# Patient Record
Sex: Male | Born: 1935
Health system: Southern US, Community
[De-identification: ages and names within clinical notes are randomized; demographics above are authoritative.]

## PROBLEM LIST (undated history)

## (undated) DIAGNOSIS — N289 Disorder of kidney and ureter, unspecified: Secondary | ICD-10-CM

## (undated) DIAGNOSIS — I639 Cerebral infarction, unspecified: Secondary | ICD-10-CM

## (undated) DIAGNOSIS — R1310 Dysphagia, unspecified: Secondary | ICD-10-CM

## (undated) DIAGNOSIS — G8190 Hemiplegia, unspecified affecting unspecified side: Secondary | ICD-10-CM

## (undated) DIAGNOSIS — I10 Essential (primary) hypertension: Secondary | ICD-10-CM

## (undated) DIAGNOSIS — E785 Hyperlipidemia, unspecified: Secondary | ICD-10-CM

## (undated) DIAGNOSIS — M1990 Unspecified osteoarthritis, unspecified site: Secondary | ICD-10-CM

## (undated) DIAGNOSIS — R41841 Cognitive communication deficit: Secondary | ICD-10-CM

## (undated) DIAGNOSIS — D649 Anemia, unspecified: Secondary | ICD-10-CM

## (undated) DIAGNOSIS — R471 Dysarthria and anarthria: Secondary | ICD-10-CM

---

## 1985-04-06 HISTORY — PX: TIBIA FRACTURE SURGERY: SHX806

## 2000-07-20 ENCOUNTER — Encounter: Payer: Self-pay | Admitting: *Deleted

## 2000-07-20 ENCOUNTER — Inpatient Hospital Stay (HOSPITAL_COMMUNITY): Admission: EM | Admit: 2000-07-20 | Discharge: 2000-07-22 | Payer: Self-pay | Admitting: *Deleted

## 2000-07-30 ENCOUNTER — Inpatient Hospital Stay (HOSPITAL_COMMUNITY): Admission: AD | Admit: 2000-07-30 | Discharge: 2000-08-27 | Payer: Self-pay | Admitting: Family Medicine

## 2000-07-30 ENCOUNTER — Encounter: Payer: Self-pay | Admitting: Family Medicine

## 2000-08-27 ENCOUNTER — Ambulatory Visit (HOSPITAL_COMMUNITY): Admission: RE | Admit: 2000-08-27 | Discharge: 2000-08-27 | Payer: Self-pay | Admitting: *Deleted

## 2001-04-26 ENCOUNTER — Encounter: Payer: Self-pay | Admitting: Internal Medicine

## 2001-04-26 ENCOUNTER — Ambulatory Visit (HOSPITAL_COMMUNITY): Admission: RE | Admit: 2001-04-26 | Discharge: 2001-04-26 | Payer: Self-pay | Admitting: Internal Medicine

## 2002-01-12 ENCOUNTER — Encounter: Payer: Self-pay | Admitting: Internal Medicine

## 2002-01-12 ENCOUNTER — Ambulatory Visit (HOSPITAL_COMMUNITY): Admission: RE | Admit: 2002-01-12 | Discharge: 2002-01-12 | Payer: Self-pay | Admitting: Internal Medicine

## 2003-08-30 ENCOUNTER — Ambulatory Visit (HOSPITAL_COMMUNITY): Admission: RE | Admit: 2003-08-30 | Discharge: 2003-08-30 | Payer: Self-pay | Admitting: Family Medicine

## 2004-04-22 ENCOUNTER — Ambulatory Visit (HOSPITAL_COMMUNITY): Admission: RE | Admit: 2004-04-22 | Discharge: 2004-04-22 | Payer: Self-pay | Admitting: Family Medicine

## 2004-07-08 ENCOUNTER — Inpatient Hospital Stay (HOSPITAL_COMMUNITY): Admission: AD | Admit: 2004-07-08 | Discharge: 2004-07-11 | Payer: Self-pay | Admitting: Emergency Medicine

## 2004-07-09 ENCOUNTER — Encounter (INDEPENDENT_AMBULATORY_CARE_PROVIDER_SITE_OTHER): Payer: Self-pay | Admitting: *Deleted

## 2004-07-09 ENCOUNTER — Ambulatory Visit: Payer: Self-pay | Admitting: Critical Care Medicine

## 2005-01-26 ENCOUNTER — Ambulatory Visit (HOSPITAL_COMMUNITY): Admission: RE | Admit: 2005-01-26 | Discharge: 2005-01-26 | Payer: Self-pay | Admitting: Family Medicine

## 2006-10-11 ENCOUNTER — Emergency Department (HOSPITAL_COMMUNITY): Admission: EM | Admit: 2006-10-11 | Discharge: 2006-10-11 | Payer: Self-pay | Admitting: Emergency Medicine

## 2010-08-22 NOTE — H&P (Signed)
[name], Lane                 ACCOUNT NO.:  [id number]   MEDICAL RECORD NO.:  [id number]          PATIENT TYPE:  INP   LOCATION:  A206                          FACILITY:  [facility]   PHYSICIAN:  Jerry Lane, M.D.DATE OF BIRTH:  11/19/1927   DATE OF ADMISSION:  07/08/2004  DATE OF DISCHARGE:  LH                                HISTORY & PHYSICAL   PRIMARY CARE PHYSICIAN:  Jerry Mires, MD.   ADMISSION DIAGNOSES:  1.  Submassive hemoptysis, rule out pulmonary tuberculosis versus lung      cancer.  2.  Poorly controlled hypertension.  3.  Poorly controlled diabetes mellitus type 2.   CHIEF COMPLAINT:  Coughing up blood and spitting blood over the last one  week.   HISTORY OF PRESENT ILLNESS:  Jerry Lane is a 77 year old African-  American male who presented to the emergency room this evening complaining  of coughing up blood, spitting blood of several days' duration.  He says he  has had various amounts of blood; but at one point at home, he said he  filled up a bowl of at least 3 to 4 ounces.   He also says he is spitting blood.  The patient is a very poor historian,  and it was difficult to definitely obtain from him if he was actually having  hemoptysis or hematemesis.  However, in the emergency room, an NG tube was  placed; and he had about 200 mL of dark blood aspirated from his stomach.  When I saw the patient on the floor during my interview with him, the  patient was coughing, and he was coughing up fresh blood; and he filled up a  bowl with probably an ounce of fresh blood although he has remained  hemodynamically stable.  He denies any fever, chills, or rigors.  He does  report losing about 10 to 15 pounds over the last 2 months.  The patient did  seem to have a history of tuberculosis and has bilateral apical scarring on  his chest x-rays.  The patient lives alone and has no one else to give  additional information.  He was brought in by an ambulance.   Initial evaluation was felt to be upper GI bleed; but on further evaluation  now with the NG tube in place which is not actively draining and patient  actively coughing up fresh blood in my presence, I do suspect that this is  more hemoptysis; and I am concerned that the patient may actually open up  and bleed more tonight.   REVIEW OF SYSTEMS:  The patient denies any nausea.  He has no headaches,  dizziness, or lightheadedness.  He has felt generally weak and tired over  time.  He denies any hematuria.  No frequency or urgency.   PAST MEDICAL HISTORY:  1.  Hypertension.  2.  Non-insulin-dependent diabetes mellitus.  3.  History of previous abdominal surgery.  The reason for this was unclear.   MEDICATIONS:  1.  Metformin 1,000 mg b.i.d.  2.  He is also on Teveten/hydrochlorothiazide HCT 600/12.5 mg daily.  ALLERGIES:  He has no known drug allergies.   FAMILY HISTORY:  Not obtainable.   SOCIAL HISTORY:  The patient is single.  Never married.  He has a 100 pack  year history of smoking.  The patient said he used to drink alcohol a lot  but quit 10 years ago.  He denies any IV drug abuse or marijuana use.   PHYSICAL EXAMINATION:  GENERAL:  The patient was conscious, alert, having  multiple bouts of cough with hemoptysis.  VITAL SIGNS:  His blood pressure initially on arrival was 143/103 with a  pulse rate of 145, respiration was 48, temperature was 97.8.  His oxygen  sats were 99% on room air.  HEENT:  Normocephalic, atraumatic.  The patient has NG tube in his nostril.  The oral mucosa was stained with dried blood.  NECK:  Supple.  No JVD.  No lymphadenopathy.  LUNGS:  Markedly reduced air entry bilaterally with occasional crackles in  the apices.  HEART:  S1 and S2 tachycardic.  No S3, S4, gallops, or rubs.  ABDOMEN:  Soft.  Previous laparotomy scar was noted.  Bowel sounds are  positive.  No masses palpable.  No hepatosplenomegaly.  EXTREMITIES:  No pitting pedal edema.  No  calf induration or tenderness was  noted.  CNS:  Grossly intact with no focal deficits.   LABORATORY/DIAGNOSTIC DATA:  White blood cell count was 5.3, hemoglobin of  10.4, hematocrit of 30.9, platelet count was 479.  There was no left shift.  PT was 14.2, INR 1.2, PTT was 36.  Sodium was 131, potassium 3.7, chloride  of 100, CO2 was 22, glucose was 226, BUN of 14, creatinine is 1.5, calcium  is 8.5.   Chest x-ray shows bilateral apical scarring with some questionable left-  sided fullness.   ASSESSMENT/PLAN:  Jerry Lane is a 77 year old African-American male with past  medical history significant for hypertension and diabetes and a remote  history of pulmonary tuberculosis.  He presented to the emergency room today  with appears clearly to be hemoptysis.  I doubt that this is hematemesis.  I  think the patient is coughing up blood and probably swallowing it.  Based on  the amount of blood the patient said he has coughed up at home and the  amount of blood we have obtained here in the emergency room, I think the  patient may have submassive hemoptysis with potential for developing massive  hemoptysis tonight.  We currently do not have subspecialty coverage  including pulmonary at this time that may perform an emergency bronchoscopy  or intervention radiology for bronchial artery embolization.  I have  discussed this with the patient and advised that it is probably best to just  transfer him to [facility].  His possible differentials include  reactivation of pulmonary tuberculosis with a possible tuberculoma aneurysm  or lung cancer as the patient has a  history of smoking.  He will need  further workup including a CT scan.  But in terms of his hemodynamics and  continued hemoptysis, I think he will be best served in an institution with  specialized coverage available.  I have called Jerry Lane at [facility] who is the hospitalist on call to discuss the patient's transfer.   The  patient will be  on contact isolation throughout the course of his transfer  and monitored on telemetry, he is tachycardic and tachypneic.    AM/MEDQ  D:  07/08/2004  T:  07/09/2004  Job:  [id number]
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
## 2010-08-22 NOTE — Op Note (Signed)
Jerry, Lane                 ACCOUNT NO.:  [id number]   MEDICAL RECORD NO.:  [id number]          PATIENT TYPE:  INP   LOCATION:  2301                         FACILITY:  [facility]   PHYSICIAN:  Oley Balm. Sung Amabile, M.D. [facility] OF BIRTH:  1927/06/28   DATE OF PROCEDURE:  07/09/2004  DATE OF DISCHARGE:                                 OPERATIVE REPORT   PROCEDURE PERFORMED:  Bronchoscopy.   INDICATIONS FOR BRONCHOSCOPY:  Hemoptysis and left upper lobe cavitary  disease.   PREMEDICATION:  Demerol 50 milligrams IV, Versed 4 milligrams IV.   ANESTHESIA:  Topical anesthesia was applied to the nose and throat and 40 cc  of 1% lidocaine were used during the course of procedure.   PROCEDURE:  After adequate sedation and anesthesia, the bronchoscope was  introduced via the right naris and advanced in the posterior pharynx. This  demonstrated normal upper airway anatomy. Vocal cords moved symmetrically.  Further airway anesthesia was achieved with 1% lidocaine. The scope was  advanced to the trachea. Complete airway anesthesia was achieved with 1%  lidocaine and an airway examination was performed. This demonstrated  anatomic variants in both upper lobes. Otherwise, segmental airway anatomy  was normal. There were mild changes of chronic bronchitis. There were  moderate mucoid secretions throughout. From the left side, there were frothy  bloody secretions, especially emanating from the left upper lobe. After  completion of the airway examination, the bronchoscope was directed into the  left upper lobe for more thorough examination. There was no evidence of  endobronchial tumors, masses or foreign bodies. There appeared to be old  blood in the bronchus to the anterior segment of the left upper lobe.  However, due to the anatomic variant here, this segment also could have been  an apical bronchus. After completion of this airway examination, bronchial  washings and cytology brushings from the  left upper lobe were performed. The  patient tolerated the procedure well without complications. There was no  active bleeding by the time the bronchoscope was removed. The above  specimens were sent for AFB, fungal, bacterial studies and cytology.      DBS/MEDQ  D:  07/09/2004  T:  07/09/2004  Job:  [id number]   cc:   Isidor Holts, M.D.
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
## 2010-10-14 ENCOUNTER — Other Ambulatory Visit: Payer: Self-pay

## 2010-10-14 ENCOUNTER — Encounter (HOSPITAL_COMMUNITY): Payer: Self-pay

## 2010-10-14 ENCOUNTER — Encounter (HOSPITAL_COMMUNITY)
Admission: RE | Admit: 2010-10-14 | Discharge: 2010-10-14 | Disposition: A | Discharge status: Home / Self Care | Payer: PRIVATE HEALTH INSURANCE | Source: Ambulatory Visit | Attending: Ophthalmology | Admitting: Ophthalmology

## 2010-10-14 HISTORY — DX: Unspecified osteoarthritis, unspecified site: M19.90

## 2010-10-14 HISTORY — DX: Cerebral infarction, unspecified: I63.9

## 2010-10-14 HISTORY — DX: Essential (primary) hypertension: I10

## 2010-10-14 LAB — CBC
Hemoglobin: 10.2 g/dL — ABNORMAL LOW (ref 13.0–17.0)
MCH: 28.8 pg (ref 26.0–34.0)
MCHC: 32.1 g/dL (ref 30.0–36.0)
Platelets: 288 10*3/uL (ref 150–400)
RDW: 13.7 % (ref 11.5–15.5)
WBC: 3.6 10*3/uL — ABNORMAL LOW (ref 4.0–10.5)

## 2010-10-14 LAB — BASIC METABOLIC PANEL
Calcium: 9.7 mg/dL (ref 8.4–10.5)
Chloride: 110 mEq/L (ref 96–112)
Potassium: 5.5 mEq/L — ABNORMAL HIGH (ref 3.5–5.1)

## 2010-10-14 MED ORDER — CYCLOPENTOLATE-PHENYLEPHRINE 0.2-1 % OP SOLN
OPHTHALMIC | Status: AC
  Filled 2010-10-14: qty 2

## 2010-10-14 NOTE — Pre-Procedure Instructions (Signed)
Dr. Jayme Cloud and Dr. Lita Mains notified of abnormal lab values. Dr. Lita Mains to call pt and PCP. Labs faxed to Dr. Adaline Sill office.

## 2010-10-14 NOTE — Patient Instructions (Addendum)
Instructions Following General Anesthetic, Adult A nurse specialized in giving anesthesia (anesthetist) or a doctor specialized in giving anesthesia (anesthesiologist) gave you a medicine that made you sleep while a procedure was performed. For as long as 24 hours following this procedure, you may feel:  Dizzy.   Weak.   Drowsy.  AFTER YOUR SURGERY 1. After surgery, you will be taken to the recovery area where a nurse will monitor your progress. You will be allowed to go home when you are awake, stable, taking fluids well, and without complications.  2.  3. For the first 24 hours following an anesthetic:   Have a responsible person with you.   Do not drive a car. If you are alone, do not take public transportation.   Do not drink alcohol.   Do not take medicine that has not been prescribed by your caregiver.   Do not sign important papers or make important decisions.   You may resume normal diet and activities as directed.   Change bandages (dressings) as directed.   Only take over-the-counter or prescription medicines for pain, discomfort, or fever as directed by your caregiver.  If you have questions or problems that seem related to the anesthetic, call the hospital and ask for the anesthetist or anesthesiologist on call. SEEK IMMEDIATE MEDICAL CARE IF:  You develop a rash.   You have difficulty breathing.   You have chest pain.   You develop any allergic problems.  Document Released: 06/29/2000 Document Re-Released: 06/17/2009 [facility] Patient Information 2011 [place], [place].20 DEVONDRE GUZZETTA  10/14/2010   Your procedure is scheduled on:  Monday, 10/20/10  Report to [facility] at 08:00 AM.  Call this number if you have problems the morning of surgery: [phone]   Remember:   Do not eat food:After Midnight.  Do not drink clear liquids: After Midnight.  Take these medicines the morning of surgery with A SIP OF WATER: hydrochlorothiazide and amlodipine. NO DIABETIC MEDS  (SUGAR PILL).   Do not wear jewelry, make-up or nail polish.  Do not bring valuables to the hospital.  Contacts, dentures or bridgework may not be worn into surgery.    Patients discharged the day of surgery will not be allowed to drive home.  Name and phone number of your driver: driver  Special Instructions: N/A   Please read over the following fact sheets that you were given: Pain Booklet and Anesthesia Post-op Instructions

## 2010-10-20 ENCOUNTER — Encounter (HOSPITAL_COMMUNITY): Payer: Self-pay | Admitting: Anesthesiology

## 2010-10-20 ENCOUNTER — Other Ambulatory Visit: Payer: Self-pay

## 2010-10-20 ENCOUNTER — Encounter (HOSPITAL_COMMUNITY): Payer: Self-pay | Admitting: *Deleted

## 2010-10-20 ENCOUNTER — Inpatient Hospital Stay (HOSPITAL_COMMUNITY)
Admission: EM | Admit: 2010-10-20 | Discharge: 2010-10-21 | DRG: 684 | Disposition: A | Discharge status: Home / Self Care | Payer: PRIVATE HEALTH INSURANCE | Attending: Internal Medicine | Admitting: Internal Medicine

## 2010-10-20 ENCOUNTER — Encounter (HOSPITAL_COMMUNITY)
Admission: RE | Disposition: A | Discharge status: Hospice | Payer: Self-pay | Source: Ambulatory Visit | Attending: Ophthalmology

## 2010-10-20 ENCOUNTER — Inpatient Hospital Stay (HOSPITAL_COMMUNITY)
Admission: RE | Admit: 2010-10-20 | Discharge: 2010-10-20 | Disposition: A | Discharge status: Hospice | Payer: PRIVATE HEALTH INSURANCE | Source: Ambulatory Visit | Attending: Ophthalmology | Admitting: Ophthalmology

## 2010-10-20 DIAGNOSIS — I679 Cerebrovascular disease, unspecified: Secondary | ICD-10-CM | POA: Insufficient documentation

## 2010-10-20 DIAGNOSIS — Z5309 Procedure and treatment not carried out because of other contraindication: Secondary | ICD-10-CM | POA: Insufficient documentation

## 2010-10-20 DIAGNOSIS — E860 Dehydration: Secondary | ICD-10-CM | POA: Diagnosis present

## 2010-10-20 DIAGNOSIS — N189 Chronic kidney disease, unspecified: Secondary | ICD-10-CM | POA: Diagnosis present

## 2010-10-20 DIAGNOSIS — N19 Unspecified kidney failure: Secondary | ICD-10-CM | POA: Diagnosis present

## 2010-10-20 DIAGNOSIS — E1122 Type 2 diabetes mellitus with diabetic chronic kidney disease: Secondary | ICD-10-CM | POA: Diagnosis present

## 2010-10-20 DIAGNOSIS — Z01810 Encounter for preprocedural cardiovascular examination: Secondary | ICD-10-CM | POA: Insufficient documentation

## 2010-10-20 DIAGNOSIS — Z8673 Personal history of transient ischemic attack (TIA), and cerebral infarction without residual deficits: Secondary | ICD-10-CM

## 2010-10-20 DIAGNOSIS — E875 Hyperkalemia: Secondary | ICD-10-CM | POA: Diagnosis present

## 2010-10-20 DIAGNOSIS — N183 Chronic kidney disease, stage 3 unspecified: Secondary | ICD-10-CM | POA: Diagnosis present

## 2010-10-20 DIAGNOSIS — H2510 Age-related nuclear cataract, unspecified eye: Secondary | ICD-10-CM | POA: Insufficient documentation

## 2010-10-20 DIAGNOSIS — I10 Essential (primary) hypertension: Secondary | ICD-10-CM | POA: Diagnosis present

## 2010-10-20 DIAGNOSIS — E119 Type 2 diabetes mellitus without complications: Secondary | ICD-10-CM | POA: Diagnosis present

## 2010-10-20 DIAGNOSIS — Z01812 Encounter for preprocedural laboratory examination: Secondary | ICD-10-CM | POA: Insufficient documentation

## 2010-10-20 DIAGNOSIS — N179 Acute kidney failure, unspecified: Principal | ICD-10-CM | POA: Diagnosis present

## 2010-10-20 DIAGNOSIS — I129 Hypertensive chronic kidney disease with stage 1 through stage 4 chronic kidney disease, or unspecified chronic kidney disease: Secondary | ICD-10-CM | POA: Diagnosis present

## 2010-10-20 LAB — DIFFERENTIAL
Eosinophils Relative: 4 % (ref 0–5)
Lymphocytes Relative: 26 % (ref 12–46)
Lymphs Abs: 1 10*3/uL (ref 0.7–4.0)
Monocytes Relative: 7 % (ref 3–12)

## 2010-10-20 LAB — CBC
HCT: 29.9 % — ABNORMAL LOW (ref 39.0–52.0)
Hemoglobin: 9.7 g/dL — ABNORMAL LOW (ref 13.0–17.0)
MCV: 89 fL (ref 78.0–100.0)
Platelets: 271 10*3/uL (ref 150–400)
RBC: 3.36 MIL/uL — ABNORMAL LOW (ref 4.22–5.81)
WBC: 3.9 10*3/uL — ABNORMAL LOW (ref 4.0–10.5)

## 2010-10-20 LAB — BASIC METABOLIC PANEL
CO2: 17 mEq/L — ABNORMAL LOW (ref 19–32)
Calcium: 9.9 mg/dL (ref 8.4–10.5)
Glucose, Bld: 106 mg/dL — ABNORMAL HIGH (ref 70–99)
Sodium: 135 mEq/L (ref 135–145)

## 2010-10-20 LAB — GLUCOSE, CAPILLARY
Glucose-Capillary: 77 mg/dL (ref 70–99)
Glucose-Capillary: 134 mg/dL — ABNORMAL HIGH (ref 70–99)

## 2010-10-20 LAB — CARDIAC PANEL(CRET KIN+CKTOT+MB+TROPI)
CK, MB: 2.8 ng/mL (ref 0.3–4.0)
Troponin I: <0.3 ng/mL (ref <0.30)

## 2010-10-20 SURGERY — PHACOEMULSIFICATION, CATARACT, WITH IOL INSERTION
Anesthesia: Monitor Anesthesia Care | Laterality: Left

## 2010-10-20 MED ORDER — LIDOCAINE HCL 3.5 % OP GEL
OPHTHALMIC | Status: AC
  Filled 2010-10-20: qty 5

## 2010-10-20 MED ORDER — LACTATED RINGERS IV SOLN
INTRAVENOUS | Status: DC
  Administered 2010-10-20: 09:00:00 via INTRAVENOUS

## 2010-10-20 MED ORDER — TETRACAINE HCL 0.5 % OP SOLN
1.0000 [drp] | OPHTHALMIC | Status: AC
  Administered 2010-10-20: 1 [drp] via OPHTHALMIC

## 2010-10-20 MED ORDER — KETOROLAC TROMETHAMINE 0.5 % OP SOLN
1.0000 [drp] | OPHTHALMIC | Status: AC
  Administered 2010-10-20: 1 [drp] via OPHTHALMIC

## 2010-10-20 MED ORDER — CYCLOPENTOLATE-PHENYLEPHRINE 0.2-1 % OP SOLN
1.0000 [drp] | OPHTHALMIC | Status: AC
  Administered 2010-10-20: 1 [drp] via OPHTHALMIC

## 2010-10-20 MED ORDER — SODIUM POLYSTYRENE SULFONATE 15 GM/60ML PO SUSP: 45.0000 g | Freq: Once | ORAL | Status: DC

## 2010-10-20 MED ORDER — TETRACAINE HCL 0.5 % OP SOLN
OPHTHALMIC | Status: AC
  Administered 2010-10-20: 1 [drp] via OPHTHALMIC
  Filled 2010-10-20: qty 2

## 2010-10-20 MED ORDER — LIDOCAINE HCL 3.5 % OP GEL: 1.0000 "application " | Freq: Once | OPHTHALMIC | Status: DC

## 2010-10-20 MED ORDER — SODIUM POLYSTYRENE SULFONATE 15 GM/60ML PO SUSP
30.0000 g | Freq: Once | ORAL | Status: AC
  Administered 2010-10-20: 30 g via ORAL
  Filled 2010-10-20: qty 120

## 2010-10-20 MED ORDER — GATIFLOXACIN 0.5 % OP SOLN
1.0000 [drp] | OPHTHALMIC | Status: DC
  Administered 2010-10-20: 1 [drp] via OPHTHALMIC

## 2010-10-20 SURGICAL SUPPLY — 26 items
CAPSULAR TENSION RING-AMO (OPHTHALMIC RELATED) IMPLANT
CLOTH BEACON ORANGE TIMEOUT ST (SAFETY) IMPLANT
GLOVE BIO SURGEON STRL SZ7.5 (GLOVE) IMPLANT
GLOVE BIOGEL M 6.5 STRL (GLOVE) IMPLANT
GLOVE BIOGEL PI IND STRL 6.5 (GLOVE) IMPLANT
GLOVE BIOGEL PI IND STRL 7.0 (GLOVE) IMPLANT
GLOVE BIOGEL PI INDICATOR 6.5 (GLOVE)
GLOVE BIOGEL PI INDICATOR 7.0 (GLOVE)
GLOVE ECLIPSE 6.5 STRL STRAW (GLOVE) IMPLANT
GLOVE ECLIPSE 7.5 STRL STRAW (GLOVE) IMPLANT
GLOVE EXAM NITRILE LRG STRL (GLOVE) IMPLANT
GLOVE EXAM NITRILE MD LF STRL (GLOVE) IMPLANT
GLOVE SKINSENSE NS SZ6.5 (GLOVE)
GLOVE SKINSENSE NS SZ7.0 (GLOVE)
GLOVE SKINSENSE STRL SZ6.5 (GLOVE) IMPLANT
GLOVE SKINSENSE STRL SZ7.0 (GLOVE) IMPLANT
INST SET CATARACT ~~LOC~~ (KITS) IMPLANT
KIT VITRECTOMY (OPHTHALMIC RELATED) IMPLANT
PAD ARMBOARD 7.5X6 YLW CONV (MISCELLANEOUS) IMPLANT
PROC W NO LENS (INTRAOCULAR LENS)
PROC W SPEC LENS (INTRAOCULAR LENS)
PROCESS W NO LENS (INTRAOCULAR LENS) IMPLANT
PROCESS W SPEC LENS (INTRAOCULAR LENS) IMPLANT
RING MALYGIN (MISCELLANEOUS) IMPLANT
VISCOELASTIC ADDITIONAL (OPHTHALMIC RELATED) IMPLANT
WATER STERILE IRR 250ML POUR (IV SOLUTION) IMPLANT

## 2010-10-20 NOTE — ED Notes (Signed)
Report given to Morrie Sheldon, RN; pt placed on monitor for transport

## 2010-10-20 NOTE — ED Notes (Signed)
Called to give report and RN unavailable at this time, RN to call me back

## 2010-10-20 NOTE — ED Notes (Signed)
Pt denies having any pain at this time.  nad noted.

## 2010-10-20 NOTE — H&P (Signed)
ENDRE COUTTS MRN: [id number] DOB/AGE: 06/07/1927 77 y.o. Primary Care Physician:HILL,GERALD K, MD Admit date: 10/20/2010 Chief Complaint: Abnormal labs with hyperkalemia. HPI: This 77 year old man had preoperative lab work done and was due to have cataract surgery today. However his labs showed a potassium of 6.2 and he has been referred to the emergency room. The patient himself has no symptoms to complain all. In particular, he denies any palpitations chest pain or dyspnea. Interestingly approximately one week ago he also had labs drawn and on this occasion his potassium was 5.5 but his creatinine was higher at 3.8 compared to today which is 2.72.   Past medical history: 1. Hypertension. 2. Type 2 diabetes. 3. Cerebrovascular disease with a previous stroke any years ago with no residual weakness. 4. Recent finding of renal failure. 5. Osteoarthritis.     Family History  Problem Relation Age of Onset  . Anesthesia problems Neg Hx   . Hypotension Neg Hx   . Malignant hyperthermia Neg Hx   . Pseudochol deficiency Neg Hx    Family history: Noncontributory.  Social history: He is single and has never married. He lives alone. He manages to live independently. He quit smoking approximately 5 years ago but prior to that had been smoking for decades. He does not drink alcohol,, having quit many years ago.  Allergies: No Known Allergies  Medications Prior to Admission  Medication Dose Route Frequency Provider Last Rate Last Dose  . cyclopentolate-phenylephrine (CYCLOMYDRYL) 0.2-1 % ophthalmic solution 1 drop  1 drop Left Eye Q5 min Susa Simmonds   1 drop at 10/20/10 4098  . ketorolac (ACULAR) 0.5 % ophthalmic solution 1 drop  1 drop Left Eye Q5 min Susa Simmonds   1 drop at 10/20/10 0930  . sodium polystyrene (KAYEXALATE) 15 GM/60ML suspension 30 g  30 g Oral Once Tammy L. Triplett, PA   30 g at 10/20/10 1742  . tetracaine (PONTOCAINE) 0.5 % ophthalmic solution 1 drop  1 drop Left  Eye Q5 min Susa Simmonds   1 drop at 10/20/10 1191  . DISCONTD: gatifloxacin (ZYMAXID) 0.5 % ophthalmic drops 1 drop  1 drop Left Eye 1 day or 1 dose Susa Simmonds   1 drop at 10/20/10 0918  . DISCONTD: lactated ringers infusion   Intravenous Continuous Laurene Footman 20 mL/hr at 10/20/10 4782    . DISCONTD: lidocaine hcl (ophth) (AKTEN) 3.5 % ophthalmic gel 1 application  1 application Left Eye Once Susa Simmonds      . DISCONTD: lidocaine hcl (ophth) (AKTEN) 3.5 % ophthalmic gel           . DISCONTD: sodium polystyrene (KAYEXALATE) 15 GM/60ML suspension 45 g  45 g Oral Once Tammy L. Triplett, PA       Medications Prior to Admission  Medication Sig Dispense Refill  . amLODipine (NORVASC) 2.5 MG tablet Take 2.5 mg by mouth daily.        Marland Kitchen aspirin 81 MG tablet Take 81 mg by mouth daily. Aspirin 81 mg daily       . hydrochlorothiazide (,MICROZIDE/HYDRODIURIL,) 12.5 MG capsule Take 12.5 mg by mouth daily.        . metFORMIN (GLUCOPHAGE) 1000 MG tablet Take 1,000 mg by mouth 2 (two) times daily with a meal.             [id number] from the symptoms mentioned above,there are no other symptoms referable to all systems reviewed.  Physical Exam: Blood pressure 144/73, pulse 63,  temperature 97.4 F (36.3 C), temperature source Oral, height 5\' 5"  (1.651 m), weight 58.968 kg (130 lb), SpO2 100.00%. He looks systemically well. He looks slightly clinically dehydrated. There is no jaundice, clubbing, cyanosis or peripheral edema. He does not look clinically anemic. Cardiovascular: Heart sounds are present without murmurs. Jugular venous pressure not raised. He is not clinically in heart failure. Respiratory: Lung fields are clear. Abdomen: Soft, nontender without any masses. There is no hepatosplenomegaly. Neurological: He is alert and orientated without any focal neurological signs. Skin: No abnormalities. Results for orders placed during the hospital encounter of 10/20/10 (from the past 48 hour(s))   GLUCOSE, CAPILLARY     Status: Normal   Collection Time   10/20/10  2:29 PM      Component Value Range Comment   Glucose-Capillary 77  70 - 99 (mg/dL)   CBC     Status: Abnormal   Collection Time   10/20/10  2:36 PM      Component Value Range Comment   WBC 3.9 (*) 4.0 - 10.5 (K/uL)    RBC 3.36 (*) 4.22 - 5.81 (MIL/uL)    Hemoglobin 9.7 (*) 13.0 - 17.0 (g/dL)    HCT 16.1 (*) 09.6 - 52.0 (%)    MCV 89.0  78.0 - 100.0 (fL)    MCH 28.9  26.0 - 34.0 (pg)    MCHC 32.4  30.0 - 36.0 (g/dL)    RDW 04.5  40.9 - 81.1 (%)    Platelets 271  150 - 400 (K/uL)   DIFFERENTIAL     Status: Abnormal   Collection Time   10/20/10  2:36 PM      Component Value Range Comment   Neutrophils Relative 62  43 - 77 (%)    Neutro Abs 2.4  1.7 - 7.7 (K/uL)    Lymphocytes Relative 26  12 - 46 (%)    Lymphs Abs 1.0  0.7 - 4.0 (K/uL)    Monocytes Relative 7  3 - 12 (%)    Monocytes Absolute 0.3  0.1 - 1.0 (K/uL)    Eosinophils Relative 4  0 - 5 (%)    Eosinophils Absolute 0.2  0.0 - 0.7 (K/uL)    Basophils Relative 2 (*) 0 - 1 (%)    Basophils Absolute 0.1  0.0 - 0.1 (K/uL)   BASIC METABOLIC PANEL     Status: Abnormal   Collection Time   10/20/10  2:36 PM      Component Value Range Comment   Sodium 135  135 - 145 (mEq/L)    Potassium 6.2 (*) 3.5 - 5.1 (mEq/L)    Chloride 106  96 - 112 (mEq/L)    CO2 17 (*) 19 - 32 (mEq/L)    Glucose, Bld 106 (*) 70 - 99 (mg/dL)    BUN 36 (*) 6 - 23 (mg/dL)    Creatinine, Ser 9.14 (*) 0.50 - 1.35 (mg/dL)    Calcium 9.9  8.4 - 10.5 (mg/dL)    GFR calc non Af Amer 23 (*) >60 (mL/min)    GFR calc Af Amer 27 (*) >60 (mL/min)   CARDIAC PANEL(CRET KIN+CKTOT+MB+TROPI)     Status: Normal   Collection Time   10/20/10  2:36 PM      Component Value Range Comment   Total CK 115  7 - 232 (U/L)    CK, MB 2.8  0.3 - 4.0 (ng/mL)    Troponin I <0.30  <0.30 (ng/mL)    Relative Index 2.4  0.0 - 2.5     ECG: T waves are normal. There are no acute ST-T wave changes. In particular there is  no peaked T waves typical of hyperkalemia.   Impression: 1. Hyperkalemia without electrocardiographic changes. 2. Renal failure causing #1. 3. Diabetes mellitus. 4. Hypertension.     Plan: 1. Admit for observation. 2. Intravenous normal saline. 3. Monitor diabetes. 4. Consider ultrasound of the kidneys if renal function does not improve with rehydration. Further recommendations will depend on patient's hospital progress.      GOSRANI,NIMISH C 10/20/2010, 6:12 PM

## 2010-10-20 NOTE — OR Nursing (Signed)
Report given to judy young rn   Transported to er triage per wc        Pt denies any problems   Sister with pt

## 2010-10-20 NOTE — ED Notes (Signed)
Pt resting comfortably in bed, pt has been up to bathroom with assistance; was waiting on bed assignment and pt has now been to room 336

## 2010-10-20 NOTE — OR Nursing (Signed)
Case canceled per dr Lita Mains and dr Kirk Ruths es due to high potassium   Family and patient advised   Instructed pt to go to a kidney specialist for follow up with his abnormal labs   I STAT WAS DONE PRE-OP AND SHOWED ABNORMAL VALUESx

## 2010-10-20 NOTE — ED Notes (Signed)
Meal tray given.  nad noted.  Family at bedside.

## 2010-10-20 NOTE — ED Notes (Signed)
Pt physically placed to hallway 1. Registration to d/c pt in computer from day surgery to ED.

## 2010-10-20 NOTE — ED Notes (Signed)
Pt from day surgery. Was supposed to have cataract sx today but potassium was too high. NAD at this time.

## 2010-10-20 NOTE — ED Provider Notes (Signed)
History     Chief Complaint  Patient presents with  . Hyperkalemia    HPI Comments: PAtient was scheduled to have cataract surgery to left eye this morning and was found to have an elevated potassium level by the pre-op labs.  Patient was in day surgery and sent to ED for further evaluation.  Patient lives at home alone and has caregiver daily.  Patient denies any symptoms at this time.  Also denies recent illness, fever, chest pain or vomiting.    Patient is a 77 y.o. male presenting with general illness. The history is provided by the patient, a relative and a caregiver.  Illness  The current episode started today. The onset is undetermined. The problem occurs rarely. The problem has been unchanged. The problem is mild. The symptoms are relieved by nothing. The symptoms are aggravated by nothing. Associated symptoms include neck pain. Pertinent negatives include no orthopnea, no fever, no decreased vision, no eye itching, no abdominal pain, no constipation, no diarrhea, no vomiting, no headaches, no muscle aches, no neck stiffness, no cough, no wheezing, no eye discharge and no eye pain. He has been behaving normally. He has been eating and drinking normally. The last void occurred less than 6 hours ago. There were no sick contacts. Recently, medical care has been given by a specialist. Services received include medications given.    Past Medical History  Diagnosis Date  . Hypertension   . Diabetes mellitus     Type II  . Arthritis   . Stroke     no residual weakness    Past Surgical History  Procedure Date  . Tibia fracture surgery 1987    Right, [facility]?    Family History  Problem Relation Age of Onset  . Anesthesia problems Neg Hx   . Hypotension Neg Hx   . Malignant hyperthermia Neg Hx   . Pseudochol deficiency Neg Hx     History  Substance Use Topics  . Smoking status: Former Smoker -- 60 years    Types: Cigarettes    Quit date: 10/13/2005  . Smokeless tobacco: Not on  file  . Alcohol Use: No      Review of Systems  Constitutional: Negative for fever, chills, appetite change, fatigue and unexpected weight change.  HENT: Positive for neck pain. Negative for nosebleeds and neck stiffness.   Eyes: Negative for pain, discharge and itching.  Respiratory: Negative for cough and wheezing.   Cardiovascular: Negative.  Negative for orthopnea.  Gastrointestinal: Negative for vomiting, abdominal pain, diarrhea and constipation.  Musculoskeletal: Negative for myalgias, back pain, joint swelling and arthralgias.  Skin: Negative.   Neurological: Negative for dizziness, seizures, facial asymmetry, weakness, numbness and headaches.  Hematological: Does not bruise/bleed easily.  Psychiatric/Behavioral: Negative for behavioral problems.    Physical Exam  BP 149/58  Pulse 67  Temp(Src) 97.4 F (36.3 C) (Oral)  Ht 5\' 5"  (1.651 m)  Wt 130 lb (58.968 kg)  BMI 21.63 kg/m2  SpO2 99%  Physical Exam  Constitutional: He is oriented to person, place, and time. He appears well-developed and well-nourished. He is cooperative.  Non-toxic appearance.  HENT:  Head: Normocephalic and atraumatic.  Eyes: EOM are normal. Pupils are equal, round, and reactive to light.  Neck: Normal range of motion. Neck supple. No JVD present.  Cardiovascular: Normal rate, regular rhythm and normal heart sounds.   Pulmonary/Chest: Effort normal and breath sounds normal.  Abdominal: Soft. Bowel sounds are normal.  Musculoskeletal: He exhibits no edema and  no tenderness.  Lymphadenopathy:    He has no cervical adenopathy.  Neurological: He is alert and oriented to person, place, and time. Coordination normal.  Skin: Skin is warm and dry.  Psychiatric: He has a normal mood and affect.    ED Course  Procedures  MDM    Date: 10/20/2010  Rate:57  Rhythm: sinus bradycardia and sinus arrhythmia  QRS Axis: normal  Intervals: normal  ST/T Wave abnormalities: nonspecific T wave changes   Conduction Disutrbances:nonspecific intraventricular block  Narrative Interpretation:   Old EKG Reviewed: no significant changes   1730  Patient remains stable, NAD.  Vitals stable.  He has ate a meal tray, I have discussed care plan with EDP and pt also seen by EDP.   Lives at home alone, K+ is 6.2 will consult for admission  Keyonta Madrid L. [place], [place] 10/20/10 1737

## 2010-10-21 ENCOUNTER — Encounter (HOSPITAL_COMMUNITY): Payer: Self-pay

## 2010-10-21 LAB — COMPREHENSIVE METABOLIC PANEL
AST: 12 U/L (ref 0–37)
BUN: 33 mg/dL — ABNORMAL HIGH (ref 6–23)
CO2: 14 mEq/L — ABNORMAL LOW (ref 19–32)
Calcium: 9.3 mg/dL (ref 8.4–10.5)
Chloride: 112 mEq/L (ref 96–112)
Creatinine, Ser: 2.49 mg/dL — ABNORMAL HIGH (ref 0.50–1.35)
GFR calc Af Amer: 30 mL/min — ABNORMAL LOW (ref >60)
GFR calc non Af Amer: 25 mL/min — ABNORMAL LOW (ref >60)
Glucose, Bld: 62 mg/dL — ABNORMAL LOW (ref 70–99)
Total Bilirubin: 0.2 mg/dL — ABNORMAL LOW (ref 0.3–1.2)

## 2010-10-21 LAB — CBC
Hemoglobin: 9.7 g/dL — ABNORMAL LOW (ref 13.0–17.0)
MCH: 29.2 pg (ref 26.0–34.0)
Platelets: 234 10*3/uL (ref 150–400)
RBC: 3.32 MIL/uL — ABNORMAL LOW (ref 4.22–5.81)
WBC: 3.5 10*3/uL — ABNORMAL LOW (ref 4.0–10.5)

## 2010-10-21 LAB — GLUCOSE, CAPILLARY
Glucose-Capillary: 66 mg/dL — ABNORMAL LOW (ref 70–99)
Glucose-Capillary: 81 mg/dL (ref 70–99)

## 2010-10-21 LAB — HEMOGLOBIN A1C: Mean Plasma Glucose: 143 mg/dL — ABNORMAL HIGH (ref <117)

## 2010-10-21 MED ORDER — ASPIRIN EC 81 MG PO TBEC
81.0000 mg | DELAYED_RELEASE_TABLET | Freq: Every day | ORAL | Status: DC
  Administered 2010-10-21: 81 mg via ORAL
  Filled 2010-10-21: qty 1

## 2010-10-21 MED ORDER — ACETAMINOPHEN 650 MG RE SUPP: 650.0000 mg | Freq: Four times a day (QID) | RECTAL | Status: DC | PRN

## 2010-10-21 MED ORDER — ONDANSETRON HCL 4 MG PO TABS: 4.0000 mg | ORAL_TABLET | Freq: Four times a day (QID) | ORAL | Status: DC | PRN

## 2010-10-21 MED ORDER — INSULIN ASPART 100 UNIT/ML ~~LOC~~ SOLN: 0.0000 [IU] | Freq: Three times a day (TID) | SUBCUTANEOUS | Status: DC

## 2010-10-21 MED ORDER — SENNA 8.6 MG PO TABS: 2.0000 | ORAL_TABLET | Freq: Every day | ORAL | Status: DC | PRN

## 2010-10-21 MED ORDER — PNEUMOCOCCAL VAC POLYVALENT 25 MCG/0.5ML IJ INJ
0.5000 mL | INJECTION | Freq: Once | INTRAMUSCULAR | Status: DC
  Filled 2010-10-21: qty 0.5

## 2010-10-21 MED ORDER — SODIUM CHLORIDE 0.9 % IV SOLN
INTRAVENOUS | Status: DC
  Administered 2010-10-21 (×2): via INTRAVENOUS

## 2010-10-21 MED ORDER — ZOLPIDEM TARTRATE 5 MG PO TABS: 5.0000 mg | ORAL_TABLET | Freq: Every evening | ORAL | Status: DC | PRN

## 2010-10-21 MED ORDER — OXYCODONE HCL 5 MG PO TABS: 5.0000 mg | ORAL_TABLET | ORAL | Status: DC | PRN

## 2010-10-21 MED ORDER — ONDANSETRON HCL 4 MG/2ML IJ SOLN: 4.0000 mg | Freq: Four times a day (QID) | INTRAMUSCULAR | Status: DC | PRN

## 2010-10-21 MED ORDER — METFORMIN HCL 500 MG PO TABS
1000.0000 mg | ORAL_TABLET | Freq: Two times a day (BID) | ORAL | Status: DC
  Administered 2010-10-21: 1000 mg via ORAL
  Filled 2010-10-21: qty 2

## 2010-10-21 MED ORDER — SODIUM POLYSTYRENE SULFONATE PO POWD: Freq: Once | ORAL | Status: AC

## 2010-10-21 MED ORDER — AMLODIPINE BESYLATE 5 MG PO TABS
2.5000 mg | ORAL_TABLET | Freq: Every day | ORAL | Status: DC
  Administered 2010-10-21: 2.5 mg via ORAL
  Filled 2010-10-21: qty 1

## 2010-10-21 MED ORDER — ASPIRIN 81 MG PO TABS: 81.0000 mg | ORAL_TABLET | Freq: Every day | ORAL | Status: DC

## 2010-10-21 MED ORDER — ACETAMINOPHEN 325 MG PO TABS: 650.0000 mg | ORAL_TABLET | Freq: Four times a day (QID) | ORAL | Status: DC | PRN

## 2010-10-21 MED ORDER — ENOXAPARIN SODIUM 40 MG/0.4ML ~~LOC~~ SOLN
40.0000 mg | SUBCUTANEOUS | Status: DC
  Administered 2010-10-21: 40 mg via SUBCUTANEOUS
  Filled 2010-10-21: qty 0.4

## 2010-10-21 NOTE — Progress Notes (Signed)
CBG:66  Treatment: 15 GM Carbohydrate snack  Symptoms: None  Follow-up CBG: Time:0800 CBG Result:81  Possible Reasons for Event: Unknown  Comments/MD notified:dr. Karilyn Cota notified    Schonewitz, Candelaria Stagers

## 2010-10-21 NOTE — Discharge Summary (Signed)
Physician Discharge Summary  Patient ID: Jerry Lane MRN: [id number] DOB/AGE: December 27, 1927 78 y.o. Primary Care Physician:HILL,GERALD K, MD Admit date: 10/20/2010 Discharge date: 10/21/2010    Discharge Diagnoses:  1. Hyperkalemia. 2. Acute on probable chronic renal failure. 3. Diabetes mellitus. 4. Hypertension.    Current Discharge Medication List    START taking these medications   Details  sodium polystyrene (KAYEXALATE) powder Take by mouth once. Take 15g once daily Qty: 454 g, Refills: 0      CONTINUE these medications which have NOT CHANGED   Details  amLODipine (NORVASC) 2.5 MG tablet Take 2.5 mg by mouth daily.      aspirin 81 MG tablet Take 81 mg by mouth daily. Aspirin 81 mg daily     metFORMIN (GLUCOPHAGE) 1000 MG tablet Take 1,000 mg by mouth 2 (two) times daily with a meal.        STOP taking these medications     hydrochlorothiazide (,MICROZIDE/HYDRODIURIL,) 12.5 MG capsule         Discharged Condition: Stable.    Consults: None.  Significant Diagnostic Studies: No results found.  Lab Results: Results for orders placed during the hospital encounter of 10/20/10 (from the past 24 hour(s))  GLUCOSE, CAPILLARY     Status: Normal   Collection Time   10/20/10  2:29 PM      Component Value Range   Glucose-Capillary 77  70 - 99 (mg/dL)  CBC     Status: Abnormal   Collection Time   10/20/10  2:36 PM      Component Value Range   WBC 3.9 (*) 4.0 - 10.5 (K/uL)   RBC 3.36 (*) 4.22 - 5.81 (MIL/uL)   Hemoglobin 9.7 (*) 13.0 - 17.0 (g/dL)   HCT 08.6 (*) 57.8 - 52.0 (%)   MCV 89.0  78.0 - 100.0 (fL)   MCH 28.9  26.0 - 34.0 (pg)   MCHC 32.4  30.0 - 36.0 (g/dL)   RDW 46.9  62.9 - 52.8 (%)   Platelets 271  150 - 400 (K/uL)  DIFFERENTIAL     Status: Abnormal   Collection Time   10/20/10  2:36 PM      Component Value Range   Neutrophils Relative 62  43 - 77 (%)   Neutro Abs 2.4  1.7 - 7.7 (K/uL)   Lymphocytes Relative 26  12 - 46 (%)   Lymphs Abs 1.0   0.7 - 4.0 (K/uL)   Monocytes Relative 7  3 - 12 (%)   Monocytes Absolute 0.3  0.1 - 1.0 (K/uL)   Eosinophils Relative 4  0 - 5 (%)   Eosinophils Absolute 0.2  0.0 - 0.7 (K/uL)   Basophils Relative 2 (*) 0 - 1 (%)   Basophils Absolute 0.1  0.0 - 0.1 (K/uL)  BASIC METABOLIC PANEL     Status: Abnormal   Collection Time   10/20/10  2:36 PM      Component Value Range   Sodium 135  135 - 145 (mEq/L)   Potassium 6.2 (*) 3.5 - 5.1 (mEq/L)   Chloride 106  96 - 112 (mEq/L)   CO2 17 (*) 19 - 32 (mEq/L)   Glucose, Bld 106 (*) 70 - 99 (mg/dL)   BUN 36 (*) 6 - 23 (mg/dL)   Creatinine, Ser 4.13 (*) 0.50 - 1.35 (mg/dL)   Calcium 9.9  8.4 - 24.4 (mg/dL)   GFR calc non Af Amer 23 (*) >60 (mL/min)   GFR calc Af Amer 27 (*) >  60 (mL/min)  CARDIAC PANEL(CRET KIN+CKTOT+MB+TROPI)     Status: Normal   Collection Time   10/20/10  2:36 PM      Component Value Range   Total CK 115  7 - 232 (U/L)   CK, MB 2.8  0.3 - 4.0 (ng/mL)   Troponin I <0.30  <0.30 (ng/mL)   Relative Index 2.4  0.0 - 2.5   GLUCOSE, CAPILLARY     Status: Abnormal   Collection Time   10/20/10  7:04 PM      Component Value Range   Glucose-Capillary 134 (*) 70 - 99 (mg/dL)   Comment 1 Documented in Chart     Comment 2 Notify RN    COMPREHENSIVE METABOLIC PANEL     Status: Abnormal   Collection Time   10/21/10  5:28 AM      Component Value Range   Sodium 140  135 - 145 (mEq/L)   Potassium 5.5 (*) 3.5 - 5.1 (mEq/L)   Chloride 112  96 - 112 (mEq/L)   CO2 14 (*) 19 - 32 (mEq/L)   Glucose, Bld 62 (*) 70 - 99 (mg/dL)   BUN 33 (*) 6 - 23 (mg/dL)   Creatinine, Ser 4.09 (*) 0.50 - 1.35 (mg/dL)   Calcium 9.3  8.4 - 81.1 (mg/dL)   Total Protein 8.2  6.0 - 8.3 (g/dL)   Albumin 3.9  3.5 - 5.2 (g/dL)   AST 12  0 - 37 (U/L)   ALT <5  0 - 53 (U/L)   Alkaline Phosphatase 58  39 - 117 (U/L)   Total Bilirubin 0.2 (*) 0.3 - 1.2 (mg/dL)   GFR calc non Af Amer 25 (*) >60 (mL/min)   GFR calc Af Amer 30 (*) >60 (mL/min)  CBC     Status: Abnormal    Collection Time   10/21/10  5:28 AM      Component Value Range   WBC 3.5 (*) 4.0 - 10.5 (K/uL)   RBC 3.32 (*) 4.22 - 5.81 (MIL/uL)   Hemoglobin 9.7 (*) 13.0 - 17.0 (g/dL)   HCT 91.4 (*) 78.2 - 52.0 (%)   MCV 89.8  78.0 - 100.0 (fL)   MCH 29.2  26.0 - 34.0 (pg)   MCHC 32.6  30.0 - 36.0 (g/dL)   RDW 95.6  21.3 - 08.6 (%)   Platelets 234  150 - 400 (K/uL)  GLUCOSE, CAPILLARY     Status: Abnormal   Collection Time   10/21/10  7:45 AM      Component Value Range   Glucose-Capillary 66 (*) 70 - 99 (mg/dL)  GLUCOSE, CAPILLARY     Status: Normal   Collection Time   10/21/10  8:12 AM      Component Value Range   Glucose-Capillary 81  70 - 99 (mg/dL)     Hospital Course: This  pleasant 77 year old man was admitted because of abnormal lab work. Please see initial history and physical examination. He was found to have potassium of 6.2 in the face of renal failure. He had no cardiac symptoms nor physical signs. His electrocardiogram did not show any evidence of peaked T waves associated with hyperkalemia. He did not have any cardiac arrhythmias. Looking back at his previous lab work, as recently as approximately one week ago his creatinine was 3.8. On this admission it was lower and today after rehydration it has improved further. His potassium is now 5.5. He says he saw a nephrologist approximately 3-4 years ago but not recently. I  suspect he has chronic kidney disease and has not followed up. Since admission he has really not had any symptoms and hasn't achieved a fairly safe potassium level with simple rehydration. He will be discharged home on Kayexalate to keep his potassium lower. Also his thiazide diuretic has been discontinued as this may have affected renal function. His blood pressure will need to be monitored in view of this.  Discharge Exam: Blood pressure 139/81, pulse 72, temperature 97.8 F (36.6 C), temperature source Oral, resp. rate 18, height 5\' 4"  (1.626 m), weight 59.33 kg (130 lb  12.8 oz), SpO2 99.00%. He looks systemically well. Heart sounds are present and normal without murmurs. Lung fields are clear. He is alert and oriented without any focal neurological signs. Abdomen is soft nontender without masses.  Disposition: Home. It is important that he follows up with a nephrologist so that his blood pressure and renal function can be monitored.  Discharge Orders    Future Orders Please Complete By Expires   Diet - low sodium heart healthy      Activity as tolerated - No restrictions         Follow-up Information    Follow up with [facility]. Make an appointment in 1 week.   Contact information:   [address] [place] [address] [phone]          Signed: Wilson Singer 10/21/2010, 10:47 AM

## 2010-10-21 NOTE — Progress Notes (Signed)
D/c instructions reviewed with patient. Verbalized understanding. Pt dc'd to home with caregiver. Schonewitz, Candelaria Stagers 10/21/2010

## 2010-10-21 NOTE — Progress Notes (Signed)
CBG 66 this am, metformin scheduled to be given.  MD notified.  Orders given to give Metformin as scheduled.  Orders received and carried out.  Schonewitz, Candelaria Stagers 10/21/2010 0800

## 2010-10-22 LAB — POCT I-STAT, CHEM 8
Chloride: 122 mEq/L — ABNORMAL HIGH (ref 96–112)
Glucose, Bld: 108 mg/dL — ABNORMAL HIGH (ref 70–99)
HCT: 34 % — ABNORMAL LOW (ref 39.0–52.0)
Hemoglobin: 11.6 g/dL — ABNORMAL LOW (ref 13.0–17.0)
Potassium: 6.6 mEq/L (ref 3.5–5.1)

## 2010-10-29 ENCOUNTER — Other Ambulatory Visit (HOSPITAL_COMMUNITY): Payer: Self-pay | Admitting: Nephrology

## 2010-10-29 DIAGNOSIS — N289 Disorder of kidney and ureter, unspecified: Secondary | ICD-10-CM

## 2010-11-03 ENCOUNTER — Ambulatory Visit (HOSPITAL_COMMUNITY)
Admission: RE | Admit: 2010-11-03 | Discharge: 2010-11-03 | Disposition: A | Discharge status: Home / Self Care | Payer: PRIVATE HEALTH INSURANCE | Source: Ambulatory Visit | Attending: Nephrology | Admitting: Nephrology

## 2010-11-03 DIAGNOSIS — N189 Chronic kidney disease, unspecified: Secondary | ICD-10-CM | POA: Insufficient documentation

## 2010-11-03 DIAGNOSIS — I10 Essential (primary) hypertension: Secondary | ICD-10-CM | POA: Insufficient documentation

## 2010-11-03 DIAGNOSIS — N400 Enlarged prostate without lower urinary tract symptoms: Secondary | ICD-10-CM | POA: Insufficient documentation

## 2010-11-03 DIAGNOSIS — E119 Type 2 diabetes mellitus without complications: Secondary | ICD-10-CM | POA: Insufficient documentation

## 2010-11-03 DIAGNOSIS — N289 Disorder of kidney and ureter, unspecified: Secondary | ICD-10-CM

## 2010-11-10 NOTE — H&P (Signed)
See scanned op note 

## 2010-12-18 NOTE — ED Provider Notes (Signed)
Medical screening examination/treatment/procedure(s) were performed by non-physician practitioner and as supervising physician I was immediately available for consultation/collaboration.  Nicholes Stairs, MD 12/18/10 [phone]

## 2011-01-20 LAB — URINALYSIS, ROUTINE W REFLEX MICROSCOPIC
Bilirubin Urine: NEGATIVE
Hgb urine dipstick: NEGATIVE
Nitrite: NEGATIVE
Protein, ur: NEGATIVE
Urobilinogen, UA: 0.2

## 2011-11-11 ENCOUNTER — Other Ambulatory Visit (HOSPITAL_COMMUNITY): Payer: Self-pay | Admitting: Urology

## 2011-11-11 DIAGNOSIS — R109 Unspecified abdominal pain: Secondary | ICD-10-CM

## 2011-11-11 DIAGNOSIS — N400 Enlarged prostate without lower urinary tract symptoms: Secondary | ICD-10-CM

## 2011-11-18 ENCOUNTER — Ambulatory Visit (HOSPITAL_COMMUNITY)
Admission: RE | Admit: 2011-11-18 | Discharge: 2011-11-18 | Disposition: A | Discharge status: Home / Self Care | Payer: PRIVATE HEALTH INSURANCE | Source: Ambulatory Visit | Attending: Urology | Admitting: Urology

## 2011-11-18 DIAGNOSIS — R1032 Left lower quadrant pain: Secondary | ICD-10-CM | POA: Insufficient documentation

## 2011-11-18 DIAGNOSIS — N400 Enlarged prostate without lower urinary tract symptoms: Secondary | ICD-10-CM

## 2011-11-18 DIAGNOSIS — R109 Unspecified abdominal pain: Secondary | ICD-10-CM

## 2013-09-04 DEATH — deceased

## 2014-04-06 DIAGNOSIS — I10 Essential (primary) hypertension: Secondary | ICD-10-CM | POA: Diagnosis not present

## 2014-04-09 DIAGNOSIS — I10 Essential (primary) hypertension: Secondary | ICD-10-CM | POA: Diagnosis not present

## 2014-04-10 DIAGNOSIS — I10 Essential (primary) hypertension: Secondary | ICD-10-CM | POA: Diagnosis not present

## 2014-04-11 DIAGNOSIS — I10 Essential (primary) hypertension: Secondary | ICD-10-CM | POA: Diagnosis not present

## 2014-04-12 DIAGNOSIS — I10 Essential (primary) hypertension: Secondary | ICD-10-CM | POA: Diagnosis not present

## 2014-04-13 DIAGNOSIS — I10 Essential (primary) hypertension: Secondary | ICD-10-CM | POA: Diagnosis not present

## 2014-04-16 DIAGNOSIS — I10 Essential (primary) hypertension: Secondary | ICD-10-CM | POA: Diagnosis not present

## 2014-04-17 DIAGNOSIS — I10 Essential (primary) hypertension: Secondary | ICD-10-CM | POA: Diagnosis not present

## 2014-04-18 DIAGNOSIS — I10 Essential (primary) hypertension: Secondary | ICD-10-CM | POA: Diagnosis not present

## 2014-04-19 ENCOUNTER — Encounter (HOSPITAL_COMMUNITY): Payer: Self-pay | Admitting: Ophthalmology

## 2014-04-19 DIAGNOSIS — I10 Essential (primary) hypertension: Secondary | ICD-10-CM | POA: Diagnosis not present

## 2014-04-20 DIAGNOSIS — I10 Essential (primary) hypertension: Secondary | ICD-10-CM | POA: Diagnosis not present

## 2014-04-23 DIAGNOSIS — E1122 Type 2 diabetes mellitus with diabetic chronic kidney disease: Secondary | ICD-10-CM | POA: Diagnosis not present

## 2014-04-23 DIAGNOSIS — N184 Chronic kidney disease, stage 4 (severe): Secondary | ICD-10-CM | POA: Diagnosis not present

## 2014-04-23 DIAGNOSIS — I499 Cardiac arrhythmia, unspecified: Secondary | ICD-10-CM | POA: Diagnosis not present

## 2014-04-23 DIAGNOSIS — I10 Essential (primary) hypertension: Secondary | ICD-10-CM | POA: Diagnosis not present

## 2014-04-24 DIAGNOSIS — I10 Essential (primary) hypertension: Secondary | ICD-10-CM | POA: Diagnosis not present

## 2014-04-25 DIAGNOSIS — I10 Essential (primary) hypertension: Secondary | ICD-10-CM | POA: Diagnosis not present

## 2014-04-26 DIAGNOSIS — I10 Essential (primary) hypertension: Secondary | ICD-10-CM | POA: Diagnosis not present

## 2014-04-27 DIAGNOSIS — I10 Essential (primary) hypertension: Secondary | ICD-10-CM | POA: Diagnosis not present

## 2014-04-30 DIAGNOSIS — I10 Essential (primary) hypertension: Secondary | ICD-10-CM | POA: Diagnosis not present

## 2014-05-01 DIAGNOSIS — I10 Essential (primary) hypertension: Secondary | ICD-10-CM | POA: Diagnosis not present

## 2014-05-02 DIAGNOSIS — I10 Essential (primary) hypertension: Secondary | ICD-10-CM | POA: Diagnosis not present

## 2014-05-03 DIAGNOSIS — I10 Essential (primary) hypertension: Secondary | ICD-10-CM | POA: Diagnosis not present

## 2014-05-04 DIAGNOSIS — I10 Essential (primary) hypertension: Secondary | ICD-10-CM | POA: Diagnosis not present

## 2014-05-07 DIAGNOSIS — I10 Essential (primary) hypertension: Secondary | ICD-10-CM | POA: Diagnosis not present

## 2014-05-08 DIAGNOSIS — I10 Essential (primary) hypertension: Secondary | ICD-10-CM | POA: Diagnosis not present

## 2014-05-09 DIAGNOSIS — I10 Essential (primary) hypertension: Secondary | ICD-10-CM | POA: Diagnosis not present

## 2014-05-10 DIAGNOSIS — I10 Essential (primary) hypertension: Secondary | ICD-10-CM | POA: Diagnosis not present

## 2014-05-11 DIAGNOSIS — I10 Essential (primary) hypertension: Secondary | ICD-10-CM | POA: Diagnosis not present

## 2014-05-14 DIAGNOSIS — I10 Essential (primary) hypertension: Secondary | ICD-10-CM | POA: Diagnosis not present

## 2014-05-15 DIAGNOSIS — I10 Essential (primary) hypertension: Secondary | ICD-10-CM | POA: Diagnosis not present

## 2014-05-16 DIAGNOSIS — I10 Essential (primary) hypertension: Secondary | ICD-10-CM | POA: Diagnosis not present

## 2014-05-17 DIAGNOSIS — I10 Essential (primary) hypertension: Secondary | ICD-10-CM | POA: Diagnosis not present

## 2014-05-18 DIAGNOSIS — I10 Essential (primary) hypertension: Secondary | ICD-10-CM | POA: Diagnosis not present

## 2014-05-21 DIAGNOSIS — I10 Essential (primary) hypertension: Secondary | ICD-10-CM | POA: Diagnosis not present

## 2014-05-22 DIAGNOSIS — I10 Essential (primary) hypertension: Secondary | ICD-10-CM | POA: Diagnosis not present

## 2014-05-23 DIAGNOSIS — I10 Essential (primary) hypertension: Secondary | ICD-10-CM | POA: Diagnosis not present

## 2014-05-24 DIAGNOSIS — I10 Essential (primary) hypertension: Secondary | ICD-10-CM | POA: Diagnosis not present

## 2014-05-25 DIAGNOSIS — I10 Essential (primary) hypertension: Secondary | ICD-10-CM | POA: Diagnosis not present

## 2014-05-28 DIAGNOSIS — I10 Essential (primary) hypertension: Secondary | ICD-10-CM | POA: Diagnosis not present

## 2014-05-29 DIAGNOSIS — I10 Essential (primary) hypertension: Secondary | ICD-10-CM | POA: Diagnosis not present

## 2014-05-30 DIAGNOSIS — I10 Essential (primary) hypertension: Secondary | ICD-10-CM | POA: Diagnosis not present

## 2014-05-31 DIAGNOSIS — I10 Essential (primary) hypertension: Secondary | ICD-10-CM | POA: Diagnosis not present

## 2014-06-01 DIAGNOSIS — I10 Essential (primary) hypertension: Secondary | ICD-10-CM | POA: Diagnosis not present

## 2014-06-04 DIAGNOSIS — I10 Essential (primary) hypertension: Secondary | ICD-10-CM | POA: Diagnosis not present

## 2014-06-05 DIAGNOSIS — I10 Essential (primary) hypertension: Secondary | ICD-10-CM | POA: Diagnosis not present

## 2014-06-06 DIAGNOSIS — I10 Essential (primary) hypertension: Secondary | ICD-10-CM | POA: Diagnosis not present

## 2014-06-07 DIAGNOSIS — I10 Essential (primary) hypertension: Secondary | ICD-10-CM | POA: Diagnosis not present

## 2014-06-08 DIAGNOSIS — I10 Essential (primary) hypertension: Secondary | ICD-10-CM | POA: Diagnosis not present

## 2014-06-11 DIAGNOSIS — I10 Essential (primary) hypertension: Secondary | ICD-10-CM | POA: Diagnosis not present

## 2014-06-12 DIAGNOSIS — I10 Essential (primary) hypertension: Secondary | ICD-10-CM | POA: Diagnosis not present

## 2014-06-13 DIAGNOSIS — I10 Essential (primary) hypertension: Secondary | ICD-10-CM | POA: Diagnosis not present

## 2014-06-14 DIAGNOSIS — I10 Essential (primary) hypertension: Secondary | ICD-10-CM | POA: Diagnosis not present

## 2014-06-15 DIAGNOSIS — I10 Essential (primary) hypertension: Secondary | ICD-10-CM | POA: Diagnosis not present

## 2014-06-18 DIAGNOSIS — I10 Essential (primary) hypertension: Secondary | ICD-10-CM | POA: Diagnosis not present

## 2014-06-19 DIAGNOSIS — I10 Essential (primary) hypertension: Secondary | ICD-10-CM | POA: Diagnosis not present

## 2014-06-20 DIAGNOSIS — I10 Essential (primary) hypertension: Secondary | ICD-10-CM | POA: Diagnosis not present

## 2014-06-21 DIAGNOSIS — I10 Essential (primary) hypertension: Secondary | ICD-10-CM | POA: Diagnosis not present

## 2014-06-22 DIAGNOSIS — I10 Essential (primary) hypertension: Secondary | ICD-10-CM | POA: Diagnosis not present

## 2014-06-25 DIAGNOSIS — E1122 Type 2 diabetes mellitus with diabetic chronic kidney disease: Secondary | ICD-10-CM | POA: Diagnosis not present

## 2014-06-25 DIAGNOSIS — I10 Essential (primary) hypertension: Secondary | ICD-10-CM | POA: Diagnosis not present

## 2014-06-25 DIAGNOSIS — N184 Chronic kidney disease, stage 4 (severe): Secondary | ICD-10-CM | POA: Diagnosis not present

## 2014-06-25 DIAGNOSIS — I129 Hypertensive chronic kidney disease with stage 1 through stage 4 chronic kidney disease, or unspecified chronic kidney disease: Secondary | ICD-10-CM | POA: Diagnosis not present

## 2014-06-26 DIAGNOSIS — I10 Essential (primary) hypertension: Secondary | ICD-10-CM | POA: Diagnosis not present

## 2014-06-27 DIAGNOSIS — I10 Essential (primary) hypertension: Secondary | ICD-10-CM | POA: Diagnosis not present

## 2014-06-28 DIAGNOSIS — I10 Essential (primary) hypertension: Secondary | ICD-10-CM | POA: Diagnosis not present

## 2014-06-29 DIAGNOSIS — I10 Essential (primary) hypertension: Secondary | ICD-10-CM | POA: Diagnosis not present

## 2014-07-02 DIAGNOSIS — I10 Essential (primary) hypertension: Secondary | ICD-10-CM | POA: Diagnosis not present

## 2014-07-03 DIAGNOSIS — I10 Essential (primary) hypertension: Secondary | ICD-10-CM | POA: Diagnosis not present

## 2014-07-04 DIAGNOSIS — I10 Essential (primary) hypertension: Secondary | ICD-10-CM | POA: Diagnosis not present

## 2014-07-05 DIAGNOSIS — I10 Essential (primary) hypertension: Secondary | ICD-10-CM | POA: Diagnosis not present

## 2014-07-06 DIAGNOSIS — I10 Essential (primary) hypertension: Secondary | ICD-10-CM | POA: Diagnosis not present

## 2014-07-07 DIAGNOSIS — I10 Essential (primary) hypertension: Secondary | ICD-10-CM | POA: Diagnosis not present

## 2014-07-08 DIAGNOSIS — I10 Essential (primary) hypertension: Secondary | ICD-10-CM | POA: Diagnosis not present

## 2014-07-09 DIAGNOSIS — I10 Essential (primary) hypertension: Secondary | ICD-10-CM | POA: Diagnosis not present

## 2014-07-10 DIAGNOSIS — I10 Essential (primary) hypertension: Secondary | ICD-10-CM | POA: Diagnosis not present

## 2014-07-11 DIAGNOSIS — I10 Essential (primary) hypertension: Secondary | ICD-10-CM | POA: Diagnosis not present

## 2014-07-12 DIAGNOSIS — I10 Essential (primary) hypertension: Secondary | ICD-10-CM | POA: Diagnosis not present

## 2014-07-13 DIAGNOSIS — I10 Essential (primary) hypertension: Secondary | ICD-10-CM | POA: Diagnosis not present

## 2014-07-14 DIAGNOSIS — I10 Essential (primary) hypertension: Secondary | ICD-10-CM | POA: Diagnosis not present

## 2014-07-15 DIAGNOSIS — I10 Essential (primary) hypertension: Secondary | ICD-10-CM | POA: Diagnosis not present

## 2014-07-16 DIAGNOSIS — I10 Essential (primary) hypertension: Secondary | ICD-10-CM | POA: Diagnosis not present

## 2014-07-17 DIAGNOSIS — I10 Essential (primary) hypertension: Secondary | ICD-10-CM | POA: Diagnosis not present

## 2014-07-18 DIAGNOSIS — I10 Essential (primary) hypertension: Secondary | ICD-10-CM | POA: Diagnosis not present

## 2014-07-19 DIAGNOSIS — I10 Essential (primary) hypertension: Secondary | ICD-10-CM | POA: Diagnosis not present

## 2014-07-20 DIAGNOSIS — I10 Essential (primary) hypertension: Secondary | ICD-10-CM | POA: Diagnosis not present

## 2014-07-21 DIAGNOSIS — I10 Essential (primary) hypertension: Secondary | ICD-10-CM | POA: Diagnosis not present

## 2014-07-22 DIAGNOSIS — I10 Essential (primary) hypertension: Secondary | ICD-10-CM | POA: Diagnosis not present

## 2014-07-23 DIAGNOSIS — I10 Essential (primary) hypertension: Secondary | ICD-10-CM | POA: Diagnosis not present

## 2014-07-24 DIAGNOSIS — I10 Essential (primary) hypertension: Secondary | ICD-10-CM | POA: Diagnosis not present

## 2014-07-25 DIAGNOSIS — I10 Essential (primary) hypertension: Secondary | ICD-10-CM | POA: Diagnosis not present

## 2014-07-26 DIAGNOSIS — I10 Essential (primary) hypertension: Secondary | ICD-10-CM | POA: Diagnosis not present

## 2014-07-27 DIAGNOSIS — I10 Essential (primary) hypertension: Secondary | ICD-10-CM | POA: Diagnosis not present

## 2014-07-28 DIAGNOSIS — I10 Essential (primary) hypertension: Secondary | ICD-10-CM | POA: Diagnosis not present

## 2014-07-29 DIAGNOSIS — I10 Essential (primary) hypertension: Secondary | ICD-10-CM | POA: Diagnosis not present

## 2014-07-30 DIAGNOSIS — I10 Essential (primary) hypertension: Secondary | ICD-10-CM | POA: Diagnosis not present

## 2014-07-31 DIAGNOSIS — I10 Essential (primary) hypertension: Secondary | ICD-10-CM | POA: Diagnosis not present

## 2014-08-01 DIAGNOSIS — I10 Essential (primary) hypertension: Secondary | ICD-10-CM | POA: Diagnosis not present

## 2014-08-02 DIAGNOSIS — I10 Essential (primary) hypertension: Secondary | ICD-10-CM | POA: Diagnosis not present

## 2014-08-03 DIAGNOSIS — I10 Essential (primary) hypertension: Secondary | ICD-10-CM | POA: Diagnosis not present

## 2014-08-04 DIAGNOSIS — I10 Essential (primary) hypertension: Secondary | ICD-10-CM | POA: Diagnosis not present

## 2014-08-05 DIAGNOSIS — I10 Essential (primary) hypertension: Secondary | ICD-10-CM | POA: Diagnosis not present

## 2014-08-06 DIAGNOSIS — I10 Essential (primary) hypertension: Secondary | ICD-10-CM | POA: Diagnosis not present

## 2014-08-07 DIAGNOSIS — I10 Essential (primary) hypertension: Secondary | ICD-10-CM | POA: Diagnosis not present

## 2014-08-08 DIAGNOSIS — I10 Essential (primary) hypertension: Secondary | ICD-10-CM | POA: Diagnosis not present

## 2014-08-09 DIAGNOSIS — I10 Essential (primary) hypertension: Secondary | ICD-10-CM | POA: Diagnosis not present

## 2014-08-10 DIAGNOSIS — I10 Essential (primary) hypertension: Secondary | ICD-10-CM | POA: Diagnosis not present

## 2014-08-11 DIAGNOSIS — I10 Essential (primary) hypertension: Secondary | ICD-10-CM | POA: Diagnosis not present

## 2014-08-12 DIAGNOSIS — I10 Essential (primary) hypertension: Secondary | ICD-10-CM | POA: Diagnosis not present

## 2014-08-13 DIAGNOSIS — I10 Essential (primary) hypertension: Secondary | ICD-10-CM | POA: Diagnosis not present

## 2014-08-14 DIAGNOSIS — I10 Essential (primary) hypertension: Secondary | ICD-10-CM | POA: Diagnosis not present

## 2014-08-15 DIAGNOSIS — I10 Essential (primary) hypertension: Secondary | ICD-10-CM | POA: Diagnosis not present

## 2014-08-16 DIAGNOSIS — I10 Essential (primary) hypertension: Secondary | ICD-10-CM | POA: Diagnosis not present

## 2014-08-17 DIAGNOSIS — I10 Essential (primary) hypertension: Secondary | ICD-10-CM | POA: Diagnosis not present

## 2014-08-18 DIAGNOSIS — I10 Essential (primary) hypertension: Secondary | ICD-10-CM | POA: Diagnosis not present

## 2014-08-19 DIAGNOSIS — I10 Essential (primary) hypertension: Secondary | ICD-10-CM | POA: Diagnosis not present

## 2014-08-20 DIAGNOSIS — I10 Essential (primary) hypertension: Secondary | ICD-10-CM | POA: Diagnosis not present

## 2014-08-21 DIAGNOSIS — I10 Essential (primary) hypertension: Secondary | ICD-10-CM | POA: Diagnosis not present

## 2014-08-22 DIAGNOSIS — I10 Essential (primary) hypertension: Secondary | ICD-10-CM | POA: Diagnosis not present

## 2014-08-23 DIAGNOSIS — I10 Essential (primary) hypertension: Secondary | ICD-10-CM | POA: Diagnosis not present

## 2014-08-24 DIAGNOSIS — I10 Essential (primary) hypertension: Secondary | ICD-10-CM | POA: Diagnosis not present

## 2014-08-25 DIAGNOSIS — I10 Essential (primary) hypertension: Secondary | ICD-10-CM | POA: Diagnosis not present

## 2014-08-26 DIAGNOSIS — I10 Essential (primary) hypertension: Secondary | ICD-10-CM | POA: Diagnosis not present

## 2014-08-27 DIAGNOSIS — I10 Essential (primary) hypertension: Secondary | ICD-10-CM | POA: Diagnosis not present

## 2014-08-28 DIAGNOSIS — I10 Essential (primary) hypertension: Secondary | ICD-10-CM | POA: Diagnosis not present

## 2014-08-29 DIAGNOSIS — I10 Essential (primary) hypertension: Secondary | ICD-10-CM | POA: Diagnosis not present

## 2014-08-30 DIAGNOSIS — I10 Essential (primary) hypertension: Secondary | ICD-10-CM | POA: Diagnosis not present

## 2014-08-31 DIAGNOSIS — I10 Essential (primary) hypertension: Secondary | ICD-10-CM | POA: Diagnosis not present

## 2014-09-01 DIAGNOSIS — I10 Essential (primary) hypertension: Secondary | ICD-10-CM | POA: Diagnosis not present

## 2014-09-02 DIAGNOSIS — I10 Essential (primary) hypertension: Secondary | ICD-10-CM | POA: Diagnosis not present

## 2014-09-03 DIAGNOSIS — I10 Essential (primary) hypertension: Secondary | ICD-10-CM | POA: Diagnosis not present

## 2014-09-04 DIAGNOSIS — I10 Essential (primary) hypertension: Secondary | ICD-10-CM | POA: Diagnosis not present

## 2014-09-05 DIAGNOSIS — I10 Essential (primary) hypertension: Secondary | ICD-10-CM | POA: Diagnosis not present

## 2014-09-06 DIAGNOSIS — I10 Essential (primary) hypertension: Secondary | ICD-10-CM | POA: Diagnosis not present

## 2014-09-07 DIAGNOSIS — I10 Essential (primary) hypertension: Secondary | ICD-10-CM | POA: Diagnosis not present

## 2014-09-08 DIAGNOSIS — I10 Essential (primary) hypertension: Secondary | ICD-10-CM | POA: Diagnosis not present

## 2014-09-09 DIAGNOSIS — I10 Essential (primary) hypertension: Secondary | ICD-10-CM | POA: Diagnosis not present

## 2014-09-10 DIAGNOSIS — I10 Essential (primary) hypertension: Secondary | ICD-10-CM | POA: Diagnosis not present

## 2014-09-11 DIAGNOSIS — I10 Essential (primary) hypertension: Secondary | ICD-10-CM | POA: Diagnosis not present

## 2014-09-12 DIAGNOSIS — I10 Essential (primary) hypertension: Secondary | ICD-10-CM | POA: Diagnosis not present

## 2014-09-13 DIAGNOSIS — I10 Essential (primary) hypertension: Secondary | ICD-10-CM | POA: Diagnosis not present

## 2014-09-14 DIAGNOSIS — I10 Essential (primary) hypertension: Secondary | ICD-10-CM | POA: Diagnosis not present

## 2014-09-15 DIAGNOSIS — I10 Essential (primary) hypertension: Secondary | ICD-10-CM | POA: Diagnosis not present

## 2014-09-16 DIAGNOSIS — I10 Essential (primary) hypertension: Secondary | ICD-10-CM | POA: Diagnosis not present

## 2014-09-17 DIAGNOSIS — I10 Essential (primary) hypertension: Secondary | ICD-10-CM | POA: Diagnosis not present

## 2014-09-18 DIAGNOSIS — I10 Essential (primary) hypertension: Secondary | ICD-10-CM | POA: Diagnosis not present

## 2014-09-19 DIAGNOSIS — I10 Essential (primary) hypertension: Secondary | ICD-10-CM | POA: Diagnosis not present

## 2014-09-20 DIAGNOSIS — I10 Essential (primary) hypertension: Secondary | ICD-10-CM | POA: Diagnosis not present

## 2014-09-21 DIAGNOSIS — I10 Essential (primary) hypertension: Secondary | ICD-10-CM | POA: Diagnosis not present

## 2014-09-22 DIAGNOSIS — I10 Essential (primary) hypertension: Secondary | ICD-10-CM | POA: Diagnosis not present

## 2014-09-23 DIAGNOSIS — I10 Essential (primary) hypertension: Secondary | ICD-10-CM | POA: Diagnosis not present

## 2014-09-24 DIAGNOSIS — I10 Essential (primary) hypertension: Secondary | ICD-10-CM | POA: Diagnosis not present

## 2014-09-24 DIAGNOSIS — E1122 Type 2 diabetes mellitus with diabetic chronic kidney disease: Secondary | ICD-10-CM | POA: Diagnosis not present

## 2014-09-24 DIAGNOSIS — I129 Hypertensive chronic kidney disease with stage 1 through stage 4 chronic kidney disease, or unspecified chronic kidney disease: Secondary | ICD-10-CM | POA: Diagnosis not present

## 2014-09-25 DIAGNOSIS — I10 Essential (primary) hypertension: Secondary | ICD-10-CM | POA: Diagnosis not present

## 2014-09-26 DIAGNOSIS — I10 Essential (primary) hypertension: Secondary | ICD-10-CM | POA: Diagnosis not present

## 2014-09-27 DIAGNOSIS — I10 Essential (primary) hypertension: Secondary | ICD-10-CM | POA: Diagnosis not present

## 2014-09-28 DIAGNOSIS — I10 Essential (primary) hypertension: Secondary | ICD-10-CM | POA: Diagnosis not present

## 2014-09-29 DIAGNOSIS — I10 Essential (primary) hypertension: Secondary | ICD-10-CM | POA: Diagnosis not present

## 2014-09-30 DIAGNOSIS — I10 Essential (primary) hypertension: Secondary | ICD-10-CM | POA: Diagnosis not present

## 2014-10-01 DIAGNOSIS — I10 Essential (primary) hypertension: Secondary | ICD-10-CM | POA: Diagnosis not present

## 2014-10-02 DIAGNOSIS — I10 Essential (primary) hypertension: Secondary | ICD-10-CM | POA: Diagnosis not present

## 2014-10-03 DIAGNOSIS — I10 Essential (primary) hypertension: Secondary | ICD-10-CM | POA: Diagnosis not present

## 2014-10-04 DIAGNOSIS — I10 Essential (primary) hypertension: Secondary | ICD-10-CM | POA: Diagnosis not present

## 2014-10-05 DIAGNOSIS — I10 Essential (primary) hypertension: Secondary | ICD-10-CM | POA: Diagnosis not present

## 2014-10-06 DIAGNOSIS — I10 Essential (primary) hypertension: Secondary | ICD-10-CM | POA: Diagnosis not present

## 2014-10-07 DIAGNOSIS — I10 Essential (primary) hypertension: Secondary | ICD-10-CM | POA: Diagnosis not present

## 2014-10-08 DIAGNOSIS — I10 Essential (primary) hypertension: Secondary | ICD-10-CM | POA: Diagnosis not present

## 2014-10-09 DIAGNOSIS — I10 Essential (primary) hypertension: Secondary | ICD-10-CM | POA: Diagnosis not present

## 2014-10-10 DIAGNOSIS — I10 Essential (primary) hypertension: Secondary | ICD-10-CM | POA: Diagnosis not present

## 2014-10-11 DIAGNOSIS — I10 Essential (primary) hypertension: Secondary | ICD-10-CM | POA: Diagnosis not present

## 2014-10-12 DIAGNOSIS — I10 Essential (primary) hypertension: Secondary | ICD-10-CM | POA: Diagnosis not present

## 2014-10-13 DIAGNOSIS — I10 Essential (primary) hypertension: Secondary | ICD-10-CM | POA: Diagnosis not present

## 2014-10-14 DIAGNOSIS — I10 Essential (primary) hypertension: Secondary | ICD-10-CM | POA: Diagnosis not present

## 2014-10-15 DIAGNOSIS — I10 Essential (primary) hypertension: Secondary | ICD-10-CM | POA: Diagnosis not present

## 2014-10-16 DIAGNOSIS — I10 Essential (primary) hypertension: Secondary | ICD-10-CM | POA: Diagnosis not present

## 2014-10-17 DIAGNOSIS — I10 Essential (primary) hypertension: Secondary | ICD-10-CM | POA: Diagnosis not present

## 2014-10-18 DIAGNOSIS — I10 Essential (primary) hypertension: Secondary | ICD-10-CM | POA: Diagnosis not present

## 2014-10-19 DIAGNOSIS — I10 Essential (primary) hypertension: Secondary | ICD-10-CM | POA: Diagnosis not present

## 2014-10-20 DIAGNOSIS — I10 Essential (primary) hypertension: Secondary | ICD-10-CM | POA: Diagnosis not present

## 2014-10-21 DIAGNOSIS — I10 Essential (primary) hypertension: Secondary | ICD-10-CM | POA: Diagnosis not present

## 2014-10-22 DIAGNOSIS — I10 Essential (primary) hypertension: Secondary | ICD-10-CM | POA: Diagnosis not present

## 2014-10-23 DIAGNOSIS — I10 Essential (primary) hypertension: Secondary | ICD-10-CM | POA: Diagnosis not present

## 2014-10-24 DIAGNOSIS — I10 Essential (primary) hypertension: Secondary | ICD-10-CM | POA: Diagnosis not present

## 2014-10-25 DIAGNOSIS — I10 Essential (primary) hypertension: Secondary | ICD-10-CM | POA: Diagnosis not present

## 2014-10-26 DIAGNOSIS — I10 Essential (primary) hypertension: Secondary | ICD-10-CM | POA: Diagnosis not present

## 2014-10-27 DIAGNOSIS — I10 Essential (primary) hypertension: Secondary | ICD-10-CM | POA: Diagnosis not present

## 2014-10-28 DIAGNOSIS — I10 Essential (primary) hypertension: Secondary | ICD-10-CM | POA: Diagnosis not present

## 2014-10-29 DIAGNOSIS — I10 Essential (primary) hypertension: Secondary | ICD-10-CM | POA: Diagnosis not present

## 2014-10-30 DIAGNOSIS — I10 Essential (primary) hypertension: Secondary | ICD-10-CM | POA: Diagnosis not present

## 2014-10-31 DIAGNOSIS — I10 Essential (primary) hypertension: Secondary | ICD-10-CM | POA: Diagnosis not present

## 2014-11-01 DIAGNOSIS — I10 Essential (primary) hypertension: Secondary | ICD-10-CM | POA: Diagnosis not present

## 2014-11-02 DIAGNOSIS — I10 Essential (primary) hypertension: Secondary | ICD-10-CM | POA: Diagnosis not present

## 2014-11-03 DIAGNOSIS — I10 Essential (primary) hypertension: Secondary | ICD-10-CM | POA: Diagnosis not present

## 2014-11-04 DIAGNOSIS — I10 Essential (primary) hypertension: Secondary | ICD-10-CM | POA: Diagnosis not present

## 2014-11-05 DIAGNOSIS — I10 Essential (primary) hypertension: Secondary | ICD-10-CM | POA: Diagnosis not present

## 2014-11-06 DIAGNOSIS — I10 Essential (primary) hypertension: Secondary | ICD-10-CM | POA: Diagnosis not present

## 2014-11-07 DIAGNOSIS — I10 Essential (primary) hypertension: Secondary | ICD-10-CM | POA: Diagnosis not present

## 2014-11-08 DIAGNOSIS — I10 Essential (primary) hypertension: Secondary | ICD-10-CM | POA: Diagnosis not present

## 2014-11-09 DIAGNOSIS — I10 Essential (primary) hypertension: Secondary | ICD-10-CM | POA: Diagnosis not present

## 2014-11-10 DIAGNOSIS — I10 Essential (primary) hypertension: Secondary | ICD-10-CM | POA: Diagnosis not present

## 2014-11-11 DIAGNOSIS — I10 Essential (primary) hypertension: Secondary | ICD-10-CM | POA: Diagnosis not present

## 2014-11-12 DIAGNOSIS — I10 Essential (primary) hypertension: Secondary | ICD-10-CM | POA: Diagnosis not present

## 2014-11-13 DIAGNOSIS — I10 Essential (primary) hypertension: Secondary | ICD-10-CM | POA: Diagnosis not present

## 2014-11-14 DIAGNOSIS — I10 Essential (primary) hypertension: Secondary | ICD-10-CM | POA: Diagnosis not present

## 2014-11-15 DIAGNOSIS — I10 Essential (primary) hypertension: Secondary | ICD-10-CM | POA: Diagnosis not present

## 2014-11-16 DIAGNOSIS — I10 Essential (primary) hypertension: Secondary | ICD-10-CM | POA: Diagnosis not present

## 2014-11-17 DIAGNOSIS — I10 Essential (primary) hypertension: Secondary | ICD-10-CM | POA: Diagnosis not present

## 2014-11-18 DIAGNOSIS — I10 Essential (primary) hypertension: Secondary | ICD-10-CM | POA: Diagnosis not present

## 2014-11-19 DIAGNOSIS — I10 Essential (primary) hypertension: Secondary | ICD-10-CM | POA: Diagnosis not present

## 2014-11-20 DIAGNOSIS — I10 Essential (primary) hypertension: Secondary | ICD-10-CM | POA: Diagnosis not present

## 2014-11-21 DIAGNOSIS — I10 Essential (primary) hypertension: Secondary | ICD-10-CM | POA: Diagnosis not present

## 2014-11-22 DIAGNOSIS — I10 Essential (primary) hypertension: Secondary | ICD-10-CM | POA: Diagnosis not present

## 2014-11-23 DIAGNOSIS — I10 Essential (primary) hypertension: Secondary | ICD-10-CM | POA: Diagnosis not present

## 2014-11-24 DIAGNOSIS — I10 Essential (primary) hypertension: Secondary | ICD-10-CM | POA: Diagnosis not present

## 2014-11-25 DIAGNOSIS — I10 Essential (primary) hypertension: Secondary | ICD-10-CM | POA: Diagnosis not present

## 2014-11-26 DIAGNOSIS — I10 Essential (primary) hypertension: Secondary | ICD-10-CM | POA: Diagnosis not present

## 2014-11-27 DIAGNOSIS — I10 Essential (primary) hypertension: Secondary | ICD-10-CM | POA: Diagnosis not present

## 2014-11-28 DIAGNOSIS — I10 Essential (primary) hypertension: Secondary | ICD-10-CM | POA: Diagnosis not present

## 2014-11-29 DIAGNOSIS — I10 Essential (primary) hypertension: Secondary | ICD-10-CM | POA: Diagnosis not present

## 2014-11-30 DIAGNOSIS — I10 Essential (primary) hypertension: Secondary | ICD-10-CM | POA: Diagnosis not present

## 2014-12-01 DIAGNOSIS — I10 Essential (primary) hypertension: Secondary | ICD-10-CM | POA: Diagnosis not present

## 2014-12-02 DIAGNOSIS — I10 Essential (primary) hypertension: Secondary | ICD-10-CM | POA: Diagnosis not present

## 2014-12-03 DIAGNOSIS — I10 Essential (primary) hypertension: Secondary | ICD-10-CM | POA: Diagnosis not present

## 2014-12-04 DIAGNOSIS — I10 Essential (primary) hypertension: Secondary | ICD-10-CM | POA: Diagnosis not present

## 2014-12-05 DIAGNOSIS — I10 Essential (primary) hypertension: Secondary | ICD-10-CM | POA: Diagnosis not present

## 2014-12-06 DIAGNOSIS — I10 Essential (primary) hypertension: Secondary | ICD-10-CM | POA: Diagnosis not present

## 2014-12-07 DIAGNOSIS — I10 Essential (primary) hypertension: Secondary | ICD-10-CM | POA: Diagnosis not present

## 2014-12-08 DIAGNOSIS — I10 Essential (primary) hypertension: Secondary | ICD-10-CM | POA: Diagnosis not present

## 2014-12-09 DIAGNOSIS — I10 Essential (primary) hypertension: Secondary | ICD-10-CM | POA: Diagnosis not present

## 2014-12-10 DIAGNOSIS — I10 Essential (primary) hypertension: Secondary | ICD-10-CM | POA: Diagnosis not present

## 2014-12-11 DIAGNOSIS — I10 Essential (primary) hypertension: Secondary | ICD-10-CM | POA: Diagnosis not present

## 2014-12-12 DIAGNOSIS — I10 Essential (primary) hypertension: Secondary | ICD-10-CM | POA: Diagnosis not present

## 2014-12-13 DIAGNOSIS — I10 Essential (primary) hypertension: Secondary | ICD-10-CM | POA: Diagnosis not present

## 2014-12-14 DIAGNOSIS — I10 Essential (primary) hypertension: Secondary | ICD-10-CM | POA: Diagnosis not present

## 2014-12-15 DIAGNOSIS — I10 Essential (primary) hypertension: Secondary | ICD-10-CM | POA: Diagnosis not present

## 2014-12-16 DIAGNOSIS — I10 Essential (primary) hypertension: Secondary | ICD-10-CM | POA: Diagnosis not present

## 2014-12-17 DIAGNOSIS — I10 Essential (primary) hypertension: Secondary | ICD-10-CM | POA: Diagnosis not present

## 2014-12-18 DIAGNOSIS — I10 Essential (primary) hypertension: Secondary | ICD-10-CM | POA: Diagnosis not present

## 2014-12-19 DIAGNOSIS — I10 Essential (primary) hypertension: Secondary | ICD-10-CM | POA: Diagnosis not present

## 2014-12-20 DIAGNOSIS — I10 Essential (primary) hypertension: Secondary | ICD-10-CM | POA: Diagnosis not present

## 2014-12-21 DIAGNOSIS — I10 Essential (primary) hypertension: Secondary | ICD-10-CM | POA: Diagnosis not present

## 2014-12-22 DIAGNOSIS — I10 Essential (primary) hypertension: Secondary | ICD-10-CM | POA: Diagnosis not present

## 2014-12-23 DIAGNOSIS — I10 Essential (primary) hypertension: Secondary | ICD-10-CM | POA: Diagnosis not present

## 2014-12-24 DIAGNOSIS — E1122 Type 2 diabetes mellitus with diabetic chronic kidney disease: Secondary | ICD-10-CM | POA: Diagnosis not present

## 2014-12-24 DIAGNOSIS — N184 Chronic kidney disease, stage 4 (severe): Secondary | ICD-10-CM | POA: Diagnosis not present

## 2014-12-24 DIAGNOSIS — N429 Disorder of prostate, unspecified: Secondary | ICD-10-CM | POA: Diagnosis not present

## 2014-12-24 DIAGNOSIS — Z0000 Encounter for general adult medical examination without abnormal findings: Secondary | ICD-10-CM | POA: Diagnosis not present

## 2014-12-24 DIAGNOSIS — I10 Essential (primary) hypertension: Secondary | ICD-10-CM | POA: Diagnosis not present

## 2014-12-25 DIAGNOSIS — I10 Essential (primary) hypertension: Secondary | ICD-10-CM | POA: Diagnosis not present

## 2014-12-26 DIAGNOSIS — I10 Essential (primary) hypertension: Secondary | ICD-10-CM | POA: Diagnosis not present

## 2014-12-27 DIAGNOSIS — I10 Essential (primary) hypertension: Secondary | ICD-10-CM | POA: Diagnosis not present

## 2014-12-28 DIAGNOSIS — I10 Essential (primary) hypertension: Secondary | ICD-10-CM | POA: Diagnosis not present

## 2014-12-29 DIAGNOSIS — I10 Essential (primary) hypertension: Secondary | ICD-10-CM | POA: Diagnosis not present

## 2014-12-30 DIAGNOSIS — I10 Essential (primary) hypertension: Secondary | ICD-10-CM | POA: Diagnosis not present

## 2014-12-31 DIAGNOSIS — I10 Essential (primary) hypertension: Secondary | ICD-10-CM | POA: Diagnosis not present

## 2015-01-01 DIAGNOSIS — I10 Essential (primary) hypertension: Secondary | ICD-10-CM | POA: Diagnosis not present

## 2015-01-02 DIAGNOSIS — I10 Essential (primary) hypertension: Secondary | ICD-10-CM | POA: Diagnosis not present

## 2015-01-03 DIAGNOSIS — I10 Essential (primary) hypertension: Secondary | ICD-10-CM | POA: Diagnosis not present

## 2015-01-04 DIAGNOSIS — I10 Essential (primary) hypertension: Secondary | ICD-10-CM | POA: Diagnosis not present

## 2015-01-05 DIAGNOSIS — I10 Essential (primary) hypertension: Secondary | ICD-10-CM | POA: Diagnosis not present

## 2015-01-06 DIAGNOSIS — I10 Essential (primary) hypertension: Secondary | ICD-10-CM | POA: Diagnosis not present

## 2015-01-07 DIAGNOSIS — I10 Essential (primary) hypertension: Secondary | ICD-10-CM | POA: Diagnosis not present

## 2015-01-08 DIAGNOSIS — I10 Essential (primary) hypertension: Secondary | ICD-10-CM | POA: Diagnosis not present

## 2015-01-09 DIAGNOSIS — I10 Essential (primary) hypertension: Secondary | ICD-10-CM | POA: Diagnosis not present

## 2015-01-10 DIAGNOSIS — I10 Essential (primary) hypertension: Secondary | ICD-10-CM | POA: Diagnosis not present

## 2015-01-11 DIAGNOSIS — I10 Essential (primary) hypertension: Secondary | ICD-10-CM | POA: Diagnosis not present

## 2015-01-12 DIAGNOSIS — I10 Essential (primary) hypertension: Secondary | ICD-10-CM | POA: Diagnosis not present

## 2015-01-13 DIAGNOSIS — I10 Essential (primary) hypertension: Secondary | ICD-10-CM | POA: Diagnosis not present

## 2015-01-14 DIAGNOSIS — I10 Essential (primary) hypertension: Secondary | ICD-10-CM | POA: Diagnosis not present

## 2015-01-15 DIAGNOSIS — I10 Essential (primary) hypertension: Secondary | ICD-10-CM | POA: Diagnosis not present

## 2015-01-16 DIAGNOSIS — I10 Essential (primary) hypertension: Secondary | ICD-10-CM | POA: Diagnosis not present

## 2015-01-17 DIAGNOSIS — I10 Essential (primary) hypertension: Secondary | ICD-10-CM | POA: Diagnosis not present

## 2015-01-18 DIAGNOSIS — I10 Essential (primary) hypertension: Secondary | ICD-10-CM | POA: Diagnosis not present

## 2015-01-19 DIAGNOSIS — I10 Essential (primary) hypertension: Secondary | ICD-10-CM | POA: Diagnosis not present

## 2015-01-20 DIAGNOSIS — I10 Essential (primary) hypertension: Secondary | ICD-10-CM | POA: Diagnosis not present

## 2015-01-21 DIAGNOSIS — I10 Essential (primary) hypertension: Secondary | ICD-10-CM | POA: Diagnosis not present

## 2015-01-22 DIAGNOSIS — I10 Essential (primary) hypertension: Secondary | ICD-10-CM | POA: Diagnosis not present

## 2015-01-23 DIAGNOSIS — I10 Essential (primary) hypertension: Secondary | ICD-10-CM | POA: Diagnosis not present

## 2015-01-24 DIAGNOSIS — I10 Essential (primary) hypertension: Secondary | ICD-10-CM | POA: Diagnosis not present

## 2015-01-25 DIAGNOSIS — I10 Essential (primary) hypertension: Secondary | ICD-10-CM | POA: Diagnosis not present

## 2015-01-26 DIAGNOSIS — I10 Essential (primary) hypertension: Secondary | ICD-10-CM | POA: Diagnosis not present

## 2015-01-27 DIAGNOSIS — I10 Essential (primary) hypertension: Secondary | ICD-10-CM | POA: Diagnosis not present

## 2015-01-28 DIAGNOSIS — I10 Essential (primary) hypertension: Secondary | ICD-10-CM | POA: Diagnosis not present

## 2015-01-29 DIAGNOSIS — I10 Essential (primary) hypertension: Secondary | ICD-10-CM | POA: Diagnosis not present

## 2015-01-30 DIAGNOSIS — I10 Essential (primary) hypertension: Secondary | ICD-10-CM | POA: Diagnosis not present

## 2015-01-31 DIAGNOSIS — I10 Essential (primary) hypertension: Secondary | ICD-10-CM | POA: Diagnosis not present

## 2015-02-01 DIAGNOSIS — I10 Essential (primary) hypertension: Secondary | ICD-10-CM | POA: Diagnosis not present

## 2015-02-02 DIAGNOSIS — I10 Essential (primary) hypertension: Secondary | ICD-10-CM | POA: Diagnosis not present

## 2015-02-03 DIAGNOSIS — I10 Essential (primary) hypertension: Secondary | ICD-10-CM | POA: Diagnosis not present

## 2015-02-04 DIAGNOSIS — I10 Essential (primary) hypertension: Secondary | ICD-10-CM | POA: Diagnosis not present

## 2015-02-05 DIAGNOSIS — I10 Essential (primary) hypertension: Secondary | ICD-10-CM | POA: Diagnosis not present

## 2015-02-06 DIAGNOSIS — I10 Essential (primary) hypertension: Secondary | ICD-10-CM | POA: Diagnosis not present

## 2015-02-07 DIAGNOSIS — I10 Essential (primary) hypertension: Secondary | ICD-10-CM | POA: Diagnosis not present

## 2015-02-08 DIAGNOSIS — I10 Essential (primary) hypertension: Secondary | ICD-10-CM | POA: Diagnosis not present

## 2015-02-09 DIAGNOSIS — I10 Essential (primary) hypertension: Secondary | ICD-10-CM | POA: Diagnosis not present

## 2015-02-10 DIAGNOSIS — I10 Essential (primary) hypertension: Secondary | ICD-10-CM | POA: Diagnosis not present

## 2015-02-11 DIAGNOSIS — I10 Essential (primary) hypertension: Secondary | ICD-10-CM | POA: Diagnosis not present

## 2015-02-12 DIAGNOSIS — I10 Essential (primary) hypertension: Secondary | ICD-10-CM | POA: Diagnosis not present

## 2015-02-13 DIAGNOSIS — I10 Essential (primary) hypertension: Secondary | ICD-10-CM | POA: Diagnosis not present

## 2015-02-14 DIAGNOSIS — I10 Essential (primary) hypertension: Secondary | ICD-10-CM | POA: Diagnosis not present

## 2015-02-15 DIAGNOSIS — I10 Essential (primary) hypertension: Secondary | ICD-10-CM | POA: Diagnosis not present

## 2015-02-16 DIAGNOSIS — I10 Essential (primary) hypertension: Secondary | ICD-10-CM | POA: Diagnosis not present

## 2015-02-17 DIAGNOSIS — I10 Essential (primary) hypertension: Secondary | ICD-10-CM | POA: Diagnosis not present

## 2015-02-18 DIAGNOSIS — I10 Essential (primary) hypertension: Secondary | ICD-10-CM | POA: Diagnosis not present

## 2015-02-19 DIAGNOSIS — I10 Essential (primary) hypertension: Secondary | ICD-10-CM | POA: Diagnosis not present

## 2015-02-20 DIAGNOSIS — I10 Essential (primary) hypertension: Secondary | ICD-10-CM | POA: Diagnosis not present

## 2015-02-21 DIAGNOSIS — I10 Essential (primary) hypertension: Secondary | ICD-10-CM | POA: Diagnosis not present

## 2015-02-22 DIAGNOSIS — I10 Essential (primary) hypertension: Secondary | ICD-10-CM | POA: Diagnosis not present

## 2015-02-23 DIAGNOSIS — I10 Essential (primary) hypertension: Secondary | ICD-10-CM | POA: Diagnosis not present

## 2015-02-24 DIAGNOSIS — I10 Essential (primary) hypertension: Secondary | ICD-10-CM | POA: Diagnosis not present

## 2015-03-25 DIAGNOSIS — Z6820 Body mass index (BMI) 20.0-20.9, adult: Secondary | ICD-10-CM | POA: Diagnosis not present

## 2015-03-25 DIAGNOSIS — N184 Chronic kidney disease, stage 4 (severe): Secondary | ICD-10-CM | POA: Diagnosis not present

## 2015-03-25 DIAGNOSIS — E1122 Type 2 diabetes mellitus with diabetic chronic kidney disease: Secondary | ICD-10-CM | POA: Diagnosis not present

## 2015-04-29 DIAGNOSIS — I10 Essential (primary) hypertension: Secondary | ICD-10-CM | POA: Diagnosis not present

## 2015-04-30 DIAGNOSIS — I10 Essential (primary) hypertension: Secondary | ICD-10-CM | POA: Diagnosis not present

## 2015-05-01 DIAGNOSIS — I10 Essential (primary) hypertension: Secondary | ICD-10-CM | POA: Diagnosis not present

## 2015-05-02 DIAGNOSIS — I10 Essential (primary) hypertension: Secondary | ICD-10-CM | POA: Diagnosis not present

## 2015-05-03 DIAGNOSIS — I10 Essential (primary) hypertension: Secondary | ICD-10-CM | POA: Diagnosis not present

## 2015-05-04 DIAGNOSIS — I10 Essential (primary) hypertension: Secondary | ICD-10-CM | POA: Diagnosis not present

## 2015-05-05 DIAGNOSIS — I10 Essential (primary) hypertension: Secondary | ICD-10-CM | POA: Diagnosis not present

## 2015-05-06 DIAGNOSIS — I10 Essential (primary) hypertension: Secondary | ICD-10-CM | POA: Diagnosis not present

## 2015-05-07 DIAGNOSIS — I10 Essential (primary) hypertension: Secondary | ICD-10-CM | POA: Diagnosis not present

## 2015-05-08 DIAGNOSIS — I10 Essential (primary) hypertension: Secondary | ICD-10-CM | POA: Diagnosis not present

## 2015-05-09 DIAGNOSIS — I10 Essential (primary) hypertension: Secondary | ICD-10-CM | POA: Diagnosis not present

## 2015-05-10 DIAGNOSIS — I10 Essential (primary) hypertension: Secondary | ICD-10-CM | POA: Diagnosis not present

## 2015-05-11 DIAGNOSIS — I10 Essential (primary) hypertension: Secondary | ICD-10-CM | POA: Diagnosis not present

## 2015-05-12 DIAGNOSIS — I10 Essential (primary) hypertension: Secondary | ICD-10-CM | POA: Diagnosis not present

## 2015-05-13 DIAGNOSIS — I10 Essential (primary) hypertension: Secondary | ICD-10-CM | POA: Diagnosis not present

## 2015-05-14 DIAGNOSIS — I10 Essential (primary) hypertension: Secondary | ICD-10-CM | POA: Diagnosis not present

## 2015-05-15 DIAGNOSIS — I10 Essential (primary) hypertension: Secondary | ICD-10-CM | POA: Diagnosis not present

## 2015-05-16 DIAGNOSIS — I10 Essential (primary) hypertension: Secondary | ICD-10-CM | POA: Diagnosis not present

## 2015-05-17 DIAGNOSIS — I10 Essential (primary) hypertension: Secondary | ICD-10-CM | POA: Diagnosis not present

## 2015-05-18 DIAGNOSIS — I10 Essential (primary) hypertension: Secondary | ICD-10-CM | POA: Diagnosis not present

## 2015-05-19 DIAGNOSIS — I10 Essential (primary) hypertension: Secondary | ICD-10-CM | POA: Diagnosis not present

## 2015-05-20 DIAGNOSIS — I10 Essential (primary) hypertension: Secondary | ICD-10-CM | POA: Diagnosis not present

## 2015-05-21 DIAGNOSIS — I10 Essential (primary) hypertension: Secondary | ICD-10-CM | POA: Diagnosis not present

## 2015-05-22 DIAGNOSIS — I10 Essential (primary) hypertension: Secondary | ICD-10-CM | POA: Diagnosis not present

## 2015-05-23 DIAGNOSIS — I10 Essential (primary) hypertension: Secondary | ICD-10-CM | POA: Diagnosis not present

## 2015-05-24 DIAGNOSIS — I10 Essential (primary) hypertension: Secondary | ICD-10-CM | POA: Diagnosis not present

## 2015-05-25 DIAGNOSIS — I10 Essential (primary) hypertension: Secondary | ICD-10-CM | POA: Diagnosis not present

## 2015-05-26 DIAGNOSIS — I10 Essential (primary) hypertension: Secondary | ICD-10-CM | POA: Diagnosis not present

## 2015-05-27 DIAGNOSIS — I10 Essential (primary) hypertension: Secondary | ICD-10-CM | POA: Diagnosis not present

## 2015-05-28 DIAGNOSIS — I10 Essential (primary) hypertension: Secondary | ICD-10-CM | POA: Diagnosis not present

## 2015-05-29 DIAGNOSIS — I10 Essential (primary) hypertension: Secondary | ICD-10-CM | POA: Diagnosis not present

## 2015-05-30 DIAGNOSIS — I10 Essential (primary) hypertension: Secondary | ICD-10-CM | POA: Diagnosis not present

## 2015-05-31 DIAGNOSIS — I10 Essential (primary) hypertension: Secondary | ICD-10-CM | POA: Diagnosis not present

## 2015-06-01 DIAGNOSIS — I10 Essential (primary) hypertension: Secondary | ICD-10-CM | POA: Diagnosis not present

## 2015-06-02 DIAGNOSIS — I10 Essential (primary) hypertension: Secondary | ICD-10-CM | POA: Diagnosis not present

## 2015-06-10 DIAGNOSIS — I10 Essential (primary) hypertension: Secondary | ICD-10-CM | POA: Diagnosis not present

## 2015-06-11 DIAGNOSIS — I10 Essential (primary) hypertension: Secondary | ICD-10-CM | POA: Diagnosis not present

## 2015-06-12 DIAGNOSIS — I10 Essential (primary) hypertension: Secondary | ICD-10-CM | POA: Diagnosis not present

## 2015-06-13 DIAGNOSIS — I10 Essential (primary) hypertension: Secondary | ICD-10-CM | POA: Diagnosis not present

## 2015-06-14 DIAGNOSIS — I10 Essential (primary) hypertension: Secondary | ICD-10-CM | POA: Diagnosis not present

## 2015-06-15 DIAGNOSIS — I10 Essential (primary) hypertension: Secondary | ICD-10-CM | POA: Diagnosis not present

## 2015-06-16 DIAGNOSIS — I10 Essential (primary) hypertension: Secondary | ICD-10-CM | POA: Diagnosis not present

## 2015-06-17 DIAGNOSIS — I10 Essential (primary) hypertension: Secondary | ICD-10-CM | POA: Diagnosis not present

## 2015-06-18 DIAGNOSIS — I10 Essential (primary) hypertension: Secondary | ICD-10-CM | POA: Diagnosis not present

## 2015-06-19 DIAGNOSIS — I10 Essential (primary) hypertension: Secondary | ICD-10-CM | POA: Diagnosis not present

## 2015-06-20 DIAGNOSIS — I10 Essential (primary) hypertension: Secondary | ICD-10-CM | POA: Diagnosis not present

## 2015-06-21 DIAGNOSIS — I10 Essential (primary) hypertension: Secondary | ICD-10-CM | POA: Diagnosis not present

## 2015-06-22 DIAGNOSIS — I10 Essential (primary) hypertension: Secondary | ICD-10-CM | POA: Diagnosis not present

## 2015-06-23 DIAGNOSIS — I10 Essential (primary) hypertension: Secondary | ICD-10-CM | POA: Diagnosis not present

## 2015-06-24 DIAGNOSIS — N184 Chronic kidney disease, stage 4 (severe): Secondary | ICD-10-CM | POA: Diagnosis not present

## 2015-06-24 DIAGNOSIS — E1122 Type 2 diabetes mellitus with diabetic chronic kidney disease: Secondary | ICD-10-CM | POA: Diagnosis not present

## 2015-06-24 DIAGNOSIS — I10 Essential (primary) hypertension: Secondary | ICD-10-CM | POA: Diagnosis not present

## 2015-06-24 DIAGNOSIS — Z0000 Encounter for general adult medical examination without abnormal findings: Secondary | ICD-10-CM | POA: Diagnosis not present

## 2015-06-25 DIAGNOSIS — I10 Essential (primary) hypertension: Secondary | ICD-10-CM | POA: Diagnosis not present

## 2015-06-26 DIAGNOSIS — I10 Essential (primary) hypertension: Secondary | ICD-10-CM | POA: Diagnosis not present

## 2015-06-27 DIAGNOSIS — I10 Essential (primary) hypertension: Secondary | ICD-10-CM | POA: Diagnosis not present

## 2015-06-28 DIAGNOSIS — I10 Essential (primary) hypertension: Secondary | ICD-10-CM | POA: Diagnosis not present

## 2015-06-29 DIAGNOSIS — I10 Essential (primary) hypertension: Secondary | ICD-10-CM | POA: Diagnosis not present

## 2015-06-30 DIAGNOSIS — I10 Essential (primary) hypertension: Secondary | ICD-10-CM | POA: Diagnosis not present

## 2015-07-01 DIAGNOSIS — I10 Essential (primary) hypertension: Secondary | ICD-10-CM | POA: Diagnosis not present

## 2015-07-02 DIAGNOSIS — I10 Essential (primary) hypertension: Secondary | ICD-10-CM | POA: Diagnosis not present

## 2015-07-03 DIAGNOSIS — I10 Essential (primary) hypertension: Secondary | ICD-10-CM | POA: Diagnosis not present

## 2015-07-04 DIAGNOSIS — I10 Essential (primary) hypertension: Secondary | ICD-10-CM | POA: Diagnosis not present

## 2015-07-05 DIAGNOSIS — I10 Essential (primary) hypertension: Secondary | ICD-10-CM | POA: Diagnosis not present

## 2015-07-06 DIAGNOSIS — I10 Essential (primary) hypertension: Secondary | ICD-10-CM | POA: Diagnosis not present

## 2015-07-07 DIAGNOSIS — I10 Essential (primary) hypertension: Secondary | ICD-10-CM | POA: Diagnosis not present

## 2015-07-08 DIAGNOSIS — I10 Essential (primary) hypertension: Secondary | ICD-10-CM | POA: Diagnosis not present

## 2015-07-09 DIAGNOSIS — I10 Essential (primary) hypertension: Secondary | ICD-10-CM | POA: Diagnosis not present

## 2015-07-10 DIAGNOSIS — I10 Essential (primary) hypertension: Secondary | ICD-10-CM | POA: Diagnosis not present

## 2015-07-11 DIAGNOSIS — I10 Essential (primary) hypertension: Secondary | ICD-10-CM | POA: Diagnosis not present

## 2015-07-12 DIAGNOSIS — I10 Essential (primary) hypertension: Secondary | ICD-10-CM | POA: Diagnosis not present

## 2015-07-13 DIAGNOSIS — I10 Essential (primary) hypertension: Secondary | ICD-10-CM | POA: Diagnosis not present

## 2015-07-14 DIAGNOSIS — I10 Essential (primary) hypertension: Secondary | ICD-10-CM | POA: Diagnosis not present

## 2015-07-15 DIAGNOSIS — I10 Essential (primary) hypertension: Secondary | ICD-10-CM | POA: Diagnosis not present

## 2015-07-16 DIAGNOSIS — I10 Essential (primary) hypertension: Secondary | ICD-10-CM | POA: Diagnosis not present

## 2015-07-17 DIAGNOSIS — I10 Essential (primary) hypertension: Secondary | ICD-10-CM | POA: Diagnosis not present

## 2015-07-18 DIAGNOSIS — I10 Essential (primary) hypertension: Secondary | ICD-10-CM | POA: Diagnosis not present

## 2015-07-19 DIAGNOSIS — I10 Essential (primary) hypertension: Secondary | ICD-10-CM | POA: Diagnosis not present

## 2015-07-20 DIAGNOSIS — I10 Essential (primary) hypertension: Secondary | ICD-10-CM | POA: Diagnosis not present

## 2015-07-21 DIAGNOSIS — I10 Essential (primary) hypertension: Secondary | ICD-10-CM | POA: Diagnosis not present

## 2015-07-22 DIAGNOSIS — I10 Essential (primary) hypertension: Secondary | ICD-10-CM | POA: Diagnosis not present

## 2015-07-23 DIAGNOSIS — I10 Essential (primary) hypertension: Secondary | ICD-10-CM | POA: Diagnosis not present

## 2015-07-24 DIAGNOSIS — I10 Essential (primary) hypertension: Secondary | ICD-10-CM | POA: Diagnosis not present

## 2015-07-25 DIAGNOSIS — I10 Essential (primary) hypertension: Secondary | ICD-10-CM | POA: Diagnosis not present

## 2015-07-26 DIAGNOSIS — I10 Essential (primary) hypertension: Secondary | ICD-10-CM | POA: Diagnosis not present

## 2015-07-27 DIAGNOSIS — I10 Essential (primary) hypertension: Secondary | ICD-10-CM | POA: Diagnosis not present

## 2015-07-28 DIAGNOSIS — I10 Essential (primary) hypertension: Secondary | ICD-10-CM | POA: Diagnosis not present

## 2015-07-29 DIAGNOSIS — I10 Essential (primary) hypertension: Secondary | ICD-10-CM | POA: Diagnosis not present

## 2015-07-30 DIAGNOSIS — I10 Essential (primary) hypertension: Secondary | ICD-10-CM | POA: Diagnosis not present

## 2015-07-31 DIAGNOSIS — I10 Essential (primary) hypertension: Secondary | ICD-10-CM | POA: Diagnosis not present

## 2015-08-01 DIAGNOSIS — I10 Essential (primary) hypertension: Secondary | ICD-10-CM | POA: Diagnosis not present

## 2015-08-02 DIAGNOSIS — I10 Essential (primary) hypertension: Secondary | ICD-10-CM | POA: Diagnosis not present

## 2015-08-03 DIAGNOSIS — I10 Essential (primary) hypertension: Secondary | ICD-10-CM | POA: Diagnosis not present

## 2015-08-04 DIAGNOSIS — I10 Essential (primary) hypertension: Secondary | ICD-10-CM | POA: Diagnosis not present

## 2015-08-05 DIAGNOSIS — I10 Essential (primary) hypertension: Secondary | ICD-10-CM | POA: Diagnosis not present

## 2015-08-06 DIAGNOSIS — I10 Essential (primary) hypertension: Secondary | ICD-10-CM | POA: Diagnosis not present

## 2015-08-07 DIAGNOSIS — I10 Essential (primary) hypertension: Secondary | ICD-10-CM | POA: Diagnosis not present

## 2015-08-08 DIAGNOSIS — I10 Essential (primary) hypertension: Secondary | ICD-10-CM | POA: Diagnosis not present

## 2015-08-09 DIAGNOSIS — I10 Essential (primary) hypertension: Secondary | ICD-10-CM | POA: Diagnosis not present

## 2015-08-10 DIAGNOSIS — I10 Essential (primary) hypertension: Secondary | ICD-10-CM | POA: Diagnosis not present

## 2015-08-11 DIAGNOSIS — I10 Essential (primary) hypertension: Secondary | ICD-10-CM | POA: Diagnosis not present

## 2015-08-12 DIAGNOSIS — I10 Essential (primary) hypertension: Secondary | ICD-10-CM | POA: Diagnosis not present

## 2015-08-13 DIAGNOSIS — I10 Essential (primary) hypertension: Secondary | ICD-10-CM | POA: Diagnosis not present

## 2015-08-14 DIAGNOSIS — I10 Essential (primary) hypertension: Secondary | ICD-10-CM | POA: Diagnosis not present

## 2015-08-15 DIAGNOSIS — I10 Essential (primary) hypertension: Secondary | ICD-10-CM | POA: Diagnosis not present

## 2015-08-16 DIAGNOSIS — I10 Essential (primary) hypertension: Secondary | ICD-10-CM | POA: Diagnosis not present

## 2015-08-17 DIAGNOSIS — I10 Essential (primary) hypertension: Secondary | ICD-10-CM | POA: Diagnosis not present

## 2015-08-18 DIAGNOSIS — I10 Essential (primary) hypertension: Secondary | ICD-10-CM | POA: Diagnosis not present

## 2015-08-19 DIAGNOSIS — I10 Essential (primary) hypertension: Secondary | ICD-10-CM | POA: Diagnosis not present

## 2015-08-20 DIAGNOSIS — I10 Essential (primary) hypertension: Secondary | ICD-10-CM | POA: Diagnosis not present

## 2015-08-21 DIAGNOSIS — I10 Essential (primary) hypertension: Secondary | ICD-10-CM | POA: Diagnosis not present

## 2015-08-22 DIAGNOSIS — I10 Essential (primary) hypertension: Secondary | ICD-10-CM | POA: Diagnosis not present

## 2015-08-23 DIAGNOSIS — I10 Essential (primary) hypertension: Secondary | ICD-10-CM | POA: Diagnosis not present

## 2015-08-24 DIAGNOSIS — I10 Essential (primary) hypertension: Secondary | ICD-10-CM | POA: Diagnosis not present

## 2015-08-25 DIAGNOSIS — I10 Essential (primary) hypertension: Secondary | ICD-10-CM | POA: Diagnosis not present

## 2015-09-23 DIAGNOSIS — E1122 Type 2 diabetes mellitus with diabetic chronic kidney disease: Secondary | ICD-10-CM | POA: Diagnosis not present

## 2015-09-23 DIAGNOSIS — N184 Chronic kidney disease, stage 4 (severe): Secondary | ICD-10-CM | POA: Diagnosis not present

## 2015-09-23 DIAGNOSIS — I10 Essential (primary) hypertension: Secondary | ICD-10-CM | POA: Diagnosis not present

## 2016-01-08 DIAGNOSIS — N184 Chronic kidney disease, stage 4 (severe): Secondary | ICD-10-CM | POA: Diagnosis not present

## 2016-01-08 DIAGNOSIS — I10 Essential (primary) hypertension: Secondary | ICD-10-CM | POA: Diagnosis not present

## 2016-01-08 DIAGNOSIS — Z23 Encounter for immunization: Secondary | ICD-10-CM | POA: Diagnosis not present

## 2016-01-08 DIAGNOSIS — E1122 Type 2 diabetes mellitus with diabetic chronic kidney disease: Secondary | ICD-10-CM | POA: Diagnosis not present

## 2016-01-09 ENCOUNTER — Encounter (HOSPITAL_COMMUNITY): Payer: Self-pay

## 2016-01-09 ENCOUNTER — Emergency Department (HOSPITAL_COMMUNITY): Payer: Medicare Other

## 2016-01-09 ENCOUNTER — Observation Stay (HOSPITAL_COMMUNITY)
Admission: EM | Admit: 2016-01-09 | Discharge: 2016-01-10 | Disposition: A | Discharge status: Home / Self Care | Payer: Medicare Other | Attending: Internal Medicine | Admitting: Internal Medicine

## 2016-01-09 DIAGNOSIS — D649 Anemia, unspecified: Secondary | ICD-10-CM | POA: Diagnosis not present

## 2016-01-09 DIAGNOSIS — N289 Disorder of kidney and ureter, unspecified: Secondary | ICD-10-CM | POA: Insufficient documentation

## 2016-01-09 DIAGNOSIS — Z8673 Personal history of transient ischemic attack (TIA), and cerebral infarction without residual deficits: Secondary | ICD-10-CM | POA: Diagnosis not present

## 2016-01-09 DIAGNOSIS — N19 Unspecified kidney failure: Secondary | ICD-10-CM | POA: Diagnosis present

## 2016-01-09 DIAGNOSIS — Z79899 Other long term (current) drug therapy: Secondary | ICD-10-CM | POA: Insufficient documentation

## 2016-01-09 DIAGNOSIS — N183 Chronic kidney disease, stage 3 unspecified: Secondary | ICD-10-CM | POA: Diagnosis present

## 2016-01-09 DIAGNOSIS — Z7982 Long term (current) use of aspirin: Secondary | ICD-10-CM | POA: Insufficient documentation

## 2016-01-09 DIAGNOSIS — I10 Essential (primary) hypertension: Secondary | ICD-10-CM | POA: Diagnosis present

## 2016-01-09 DIAGNOSIS — Z7984 Long term (current) use of oral hypoglycemic drugs: Secondary | ICD-10-CM | POA: Insufficient documentation

## 2016-01-09 DIAGNOSIS — D500 Iron deficiency anemia secondary to blood loss (chronic): Secondary | ICD-10-CM | POA: Diagnosis not present

## 2016-01-09 DIAGNOSIS — E875 Hyperkalemia: Secondary | ICD-10-CM | POA: Diagnosis not present

## 2016-01-09 DIAGNOSIS — D509 Iron deficiency anemia, unspecified: Secondary | ICD-10-CM | POA: Diagnosis not present

## 2016-01-09 DIAGNOSIS — E119 Type 2 diabetes mellitus without complications: Secondary | ICD-10-CM | POA: Diagnosis not present

## 2016-01-09 DIAGNOSIS — E1122 Type 2 diabetes mellitus with diabetic chronic kidney disease: Secondary | ICD-10-CM | POA: Diagnosis present

## 2016-01-09 LAB — GLUCOSE, CAPILLARY
GLUCOSE-CAPILLARY: 81 mg/dL (ref 65–99)
Glucose-Capillary: 168 mg/dL — ABNORMAL HIGH (ref 65–99)
Glucose-Capillary: 68 mg/dL (ref 65–99)

## 2016-01-09 LAB — CBC WITH DIFFERENTIAL/PLATELET
Basophils Absolute: 0 10*3/uL (ref 0.0–0.1)
Basophils Relative: 1 %
Eosinophils Absolute: 0.3 10*3/uL (ref 0.0–0.7)
Eosinophils Relative: 7 %
HCT: 23.1 % — ABNORMAL LOW (ref 39.0–52.0)
Hemoglobin: 6.9 g/dL — CL (ref 13.0–17.0)
Lymphocytes Relative: 21 %
Lymphs Abs: 1 10*3/uL (ref 0.7–4.0)
MCH: 25.6 pg — ABNORMAL LOW (ref 26.0–34.0)
MCHC: 29.9 g/dL — ABNORMAL LOW (ref 30.0–36.0)
MCV: 85.6 fL (ref 78.0–100.0)
Monocytes Absolute: 0.4 10*3/uL (ref 0.1–1.0)
Monocytes Relative: 8 %
Neutro Abs: 3 10*3/uL (ref 1.7–7.7)
Neutrophils Relative %: 64 %
Platelets: 272 10*3/uL (ref 150–400)
RBC: 2.7 MIL/uL — ABNORMAL LOW (ref 4.22–5.81)
RDW: 15.3 % (ref 11.5–15.5)
WBC: 4.7 10*3/uL (ref 4.0–10.5)

## 2016-01-09 LAB — POC OCCULT BLOOD, ED: Fecal Occult Bld: NEGATIVE

## 2016-01-09 LAB — BASIC METABOLIC PANEL
Anion gap: 6 (ref 5–15)
BUN: 27 mg/dL — ABNORMAL HIGH (ref 6–20)
CO2: 22 mmol/L (ref 22–32)
Calcium: 8.6 mg/dL — ABNORMAL LOW (ref 8.9–10.3)
Chloride: 109 mmol/L (ref 101–111)
Creatinine, Ser: 3.12 mg/dL — ABNORMAL HIGH (ref 0.61–1.24)
GFR calc Af Amer: 19 mL/min — ABNORMAL LOW (ref >60)
GFR calc non Af Amer: 17 mL/min — ABNORMAL LOW (ref >60)
Glucose, Bld: 268 mg/dL — ABNORMAL HIGH (ref 65–99)
Potassium: 5.7 mmol/L — ABNORMAL HIGH (ref 3.5–5.1)
Sodium: 137 mmol/L (ref 135–145)

## 2016-01-09 LAB — RETICULOCYTES
RBC.: 2.67 MIL/uL — ABNORMAL LOW (ref 4.22–5.81)
Retic Count, Absolute: 32 10*3/uL (ref 19.0–186.0)
Retic Ct Pct: 1.2 % (ref 0.4–3.1)

## 2016-01-09 LAB — VITAMIN B12: Vitamin B-12: 262 pg/mL (ref 180–914)

## 2016-01-09 LAB — IRON AND TIBC
Iron: 21 ug/dL — ABNORMAL LOW (ref 45–182)
Saturation Ratios: 5 % — ABNORMAL LOW (ref 17.9–39.5)
TIBC: 455 ug/dL — AB (ref 250–450)
UIBC: 434 ug/dL

## 2016-01-09 LAB — PREPARE RBC (CROSSMATCH)

## 2016-01-09 LAB — FERRITIN: FERRITIN: 4 ng/mL — AB (ref 24–336)

## 2016-01-09 LAB — ABO/RH: ABO/RH(D): O POS

## 2016-01-09 LAB — FOLATE: Folate: 11.4 ng/mL (ref >5.9)

## 2016-01-09 MED ORDER — ACETAMINOPHEN 650 MG RE SUPP: 650.0000 mg | Freq: Four times a day (QID) | RECTAL | Status: DC | PRN

## 2016-01-09 MED ORDER — INSULIN ASPART 100 UNIT/ML ~~LOC~~ SOLN
0.0000 [IU] | Freq: Three times a day (TID) | SUBCUTANEOUS | Status: DC
  Administered 2016-01-09 – 2016-01-10 (×2): 3 [IU] via SUBCUTANEOUS

## 2016-01-09 MED ORDER — SODIUM CHLORIDE 0.9 % IV SOLN: 10.0000 mL/h | Freq: Once | INTRAVENOUS | Status: DC

## 2016-01-09 MED ORDER — INSULIN ASPART 100 UNIT/ML ~~LOC~~ SOLN
4.0000 [IU] | Freq: Three times a day (TID) | SUBCUTANEOUS | Status: DC
  Administered 2016-01-09 – 2016-01-10 (×3): 4 [IU] via SUBCUTANEOUS

## 2016-01-09 MED ORDER — ONDANSETRON HCL 4 MG/2ML IJ SOLN: 4.0000 mg | Freq: Four times a day (QID) | INTRAMUSCULAR | Status: DC | PRN

## 2016-01-09 MED ORDER — INSULIN ASPART 100 UNIT/ML ~~LOC~~ SOLN: 0.0000 [IU] | Freq: Every day | SUBCUTANEOUS | Status: DC

## 2016-01-09 MED ORDER — SENNOSIDES-DOCUSATE SODIUM 8.6-50 MG PO TABS: 1.0000 | ORAL_TABLET | Freq: Every evening | ORAL | Status: DC | PRN

## 2016-01-09 MED ORDER — ACETAMINOPHEN 325 MG PO TABS: 650.0000 mg | ORAL_TABLET | Freq: Four times a day (QID) | ORAL | Status: DC | PRN

## 2016-01-09 MED ORDER — TAMSULOSIN HCL 0.4 MG PO CAPS
0.4000 mg | ORAL_CAPSULE | Freq: Every day | ORAL | Status: DC
  Administered 2016-01-09 – 2016-01-10 (×2): 0.4 mg via ORAL
  Filled 2016-01-09 (×2): qty 1

## 2016-01-09 MED ORDER — AMLODIPINE BESYLATE 5 MG PO TABS
2.5000 mg | ORAL_TABLET | Freq: Every day | ORAL | Status: DC
  Administered 2016-01-09 – 2016-01-10 (×2): 2.5 mg via ORAL
  Filled 2016-01-09 (×2): qty 1

## 2016-01-09 MED ORDER — ONDANSETRON HCL 4 MG PO TABS: 4.0000 mg | ORAL_TABLET | Freq: Four times a day (QID) | ORAL | Status: DC | PRN

## 2016-01-09 MED ORDER — SODIUM POLYSTYRENE SULFONATE 15 GM/60ML PO SUSP
15.0000 g | Freq: Once | ORAL | Status: AC
  Administered 2016-01-09: 15 g via ORAL
  Filled 2016-01-09: qty 60

## 2016-01-09 MED ORDER — SODIUM CHLORIDE 0.9 % IV SOLN: INTRAVENOUS | Status: DC

## 2016-01-09 MED ORDER — ASPIRIN EC 81 MG PO TBEC
81.0000 mg | DELAYED_RELEASE_TABLET | Freq: Every day | ORAL | Status: DC
  Administered 2016-01-09 – 2016-01-10 (×2): 81 mg via ORAL
  Filled 2016-01-09 (×2): qty 1

## 2016-01-09 NOTE — ED Triage Notes (Signed)
Pt reports saw Dr. Loleta ChanceHill yesterday for c/o sob and feeling cold.  Reports his left leg will get cold intermittently.  Denies left feeling cold at this time.  Reports Dr. Loleta ChanceHill ordered blood work and called today and reported his hemoglobin was low.  Pt alert and oriented.

## 2016-01-09 NOTE — Care Management Note (Signed)
Case Management Note  Patient Details  Name: Marny Lowensteinrvin U Balthaser MRN: [id number] Date of Birth: 1927-04-12  Subjective/Objective:                  Pt admitted for anemia. He is from home, lives alone and is ind with ADL's. He has aid that comes 5 days a week. He uses a cane for mobility. He has 2 twin sisters who check in on him frequently. He plans to return home with self care.   Action/Plan: No CM needs anticipated.   Expected Discharge Date:     01/10/2016             Expected Discharge Plan:  Home/Self Care  In-House Referral:  NA  Discharge planning Services  CM Consult  Post Acute Care Choice:  NA Choice offered to:  NA  DME Arranged:    DME Agency:     HH Arranged:    HH Agency:     Status of Service:  Completed, signed off  If discussed at [employer] Length of Stay Meetings, dates discussed:    Additional Comments:  Malcolm MetroChildress, Yandriel Boening Demske, RN 01/09/2016, 3:27 PM

## 2016-01-09 NOTE — H&P (Signed)
History and Physical    Jerry Lane:[id number] DOB: 06/22/1927 DOA: 01/09/2016  Referring MD/NP/PA: Benjiman Core, EDP PCP: Evlyn Courier, MD  Patient coming from: Home  Chief Complaint: doctor sent me here  HPI: Jerry Lane is a 82 y.o. male with h/o DM II, HTN and CKD at least stage III, who went to see his PCP yesterday for a regularly scheduled visit during which labs were drawn. He was called today and told to come to the ED because his blood counts were low. He was found to have a Hb of 6.9. He denies melena, hematochezia, GERD symptoms. He has been complaining of fatigue and mild SOB with ambulation, no CP. FOBT was negative as performed by EDP. He is also noted to have a Cr of 3.12, above from his last known baseline of 2.49 in 2012. He says he used to see a kidney doctor but does not remember the last time he did so. Also noted to have a potassium of 5.7, no EKG abnormalities other than scatered PVCs.  Admission requested.  Past Medical/Surgical History: Past Medical History:  Diagnosis Date  . Arthritis   . Diabetes mellitus    Type II  . Hypertension   . Stroke [facility])    no residual weakness    Past Surgical History:  Procedure Laterality Date  . TIBIA FRACTURE SURGERY  1987   Right, [facility]?    Social History:  reports that he quit smoking about 10 years ago. His smoking use included Cigarettes. He quit after 60.00 years of use. He has never used smokeless tobacco. He reports that he does not drink alcohol or use drugs.  Allergies: No Known Allergies  Family History:  Family History  Problem Relation Age of Onset  . Anesthesia problems Neg Hx   . Hypotension Neg Hx   . Malignant hyperthermia Neg Hx   . Pseudochol deficiency Neg Hx     Prior to Admission medications   Medication Sig Start Date End Date Taking? Authorizing Provider  amLODipine (NORVASC) 2.5 MG tablet Take 2.5 mg by mouth daily.     Yes Historical Provider, MD  aspirin 81 MG tablet Take  81 mg by mouth daily. Aspirin 81 mg daily    Yes Historical Provider, MD  hydrochlorothiazide (MICROZIDE) 12.5 MG capsule Take 12.5 mg by mouth daily.   Yes Historical Provider, MD  metFORMIN (GLUCOPHAGE) 1000 MG tablet Take 1,000 mg by mouth 2 (two) times daily with a meal.     Yes Historical Provider, MD  tamsulosin (FLOMAX) 0.4 MG CAPS capsule Take 0.4 mg by mouth daily.   Yes Historical Provider, MD  valsartan (DIOVAN) 160 MG tablet Take 1 tablet by mouth daily. 11/12/15  Yes Historical Provider, MD    Review of Systems:  Constitutional: Denies fever, chills, diaphoresis, appetite change and fatigue.  HEENT: Denies photophobia, eye pain, redness, hearing loss, ear pain, congestion, sore throat, rhinorrhea, sneezing, mouth sores, trouble swallowing, neck pain, neck stiffness and tinnitus.   Respiratory: Denies SOB, DOE, cough, chest tightness,  and wheezing.   Cardiovascular: Denies chest pain, palpitations and leg swelling.  Gastrointestinal: Denies nausea, vomiting, abdominal pain, diarrhea, constipation, blood in stool and abdominal distention.  Genitourinary: Denies dysuria, urgency, frequency, hematuria, flank pain and difficulty urinating.  Endocrine: Denies: hot or cold intolerance, sweats, changes in hair or nails, polyuria, polydipsia. Musculoskeletal: Denies myalgias, back pain, joint swelling, arthralgias and gait problem.  Skin: Denies pallor, rash and wound.  Neurological: Denies dizziness,  seizures, syncope, weakness, light-headedness, numbness and headaches.  Hematological: Denies adenopathy. Easy bruising, personal or family bleeding history  Psychiatric/Behavioral: Denies suicidal ideation, mood changes, confusion, nervousness, sleep disturbance and agitation    Physical Exam: Vitals:   01/09/16 1218 01/09/16 1458 01/09/16 1522 01/09/16 1557  BP: 140/64 (!) 164/55 (!) 143/84 (!) 144/71  Pulse: 105 91 82 82  Resp: 20 14 14 14   Temp: 98.3 F (36.8 C) 98.6 F (37 C) 98.4  F (36.9 C) 98.7 F (37.1 C)  TempSrc: Oral Oral Oral Oral  SpO2: 100% 100% 100% 100%  Weight: 59 kg (130 lb)     Height:  5\' 6"  (1.676 m)       Constitutional: NAD, calm, comfortable Eyes: PERRL, lids and conjunctivae normal ENMT: Mucous membranes are moist. Posterior pharynx clear of any exudate or lesions.Normal dentition.  Neck: normal, supple, no masses, no thyromegaly Respiratory: clear to auscultation bilaterally, no wheezing, no crackles. Normal respiratory effort. No accessory muscle use.  Cardiovascular: Regular rate and rhythm, no murmurs / rubs / gallops. No extremity edema. 2+ pedal pulses. No carotid bruits.  Abdomen: no tenderness, no masses palpated. No hepatosplenomegaly. Bowel sounds positive.  Musculoskeletal: no clubbing / cyanosis. No joint deformity upper and lower extremities. Good ROM, no contractures. Normal muscle tone.  Skin: no rashes, lesions, ulcers. No induration Neurologic: CN 2-12 grossly intact. Sensation intact, DTR normal. Strength 5/5 in all 4.  Psychiatric: Normal judgment and insight. Alert and oriented x 3. Normal mood.    Labs on Admission: I have personally reviewed the following labs and imaging studies  CBC:  Recent Labs Lab 01/09/16 1236  WBC 4.7  NEUTROABS 3.0  HGB 6.9*  HCT 23.1*  MCV 85.6  PLT 272   Basic Metabolic Panel:  Recent Labs Lab 01/09/16 1236  NA 137  K 5.7*  CL 109  CO2 22  GLUCOSE 268*  BUN 27*  CREATININE 3.12*  CALCIUM 8.6*   GFR: Estimated Creatinine Clearance: 14.2 mL/min (by C-G formula based on SCr of 3.12 mg/dL (H)). Liver Function Tests: No results for input(s): AST, ALT, ALKPHOS, BILITOT, PROT, ALBUMIN in the last 168 hours. No results for input(s): LIPASE, AMYLASE in the last 168 hours. No results for input(s): AMMONIA in the last 168 hours. Coagulation Profile: No results for input(s): INR, PROTIME in the last 168 hours. Cardiac Enzymes: No results for input(s): CKTOTAL, CKMB, CKMBINDEX,  TROPONINI in the last 168 hours. BNP (last 3 results) No results for input(s): PROBNP in the last 8760 hours. HbA1C: No results for input(s): HGBA1C in the last 72 hours. CBG: No results for input(s): GLUCAP in the last 168 hours. Lipid Profile: No results for input(s): CHOL, HDL, LDLCALC, TRIG, CHOLHDL, LDLDIRECT in the last 72 hours. Thyroid Function Tests: No results for input(s): TSH, T4TOTAL, FREET4, T3FREE, THYROIDAB in the last 72 hours. Anemia Panel:  Recent Labs  01/09/16 1236  RETICCTPCT 1.2   Urine analysis:    Component Value Date/Time   COLORURINE YELLOW 10/11/2006 1026   APPEARANCEUR CLEAR 10/11/2006 1026   LABSPEC 1.020 10/11/2006 1026   PHURINE 5.5 10/11/2006 1026   GLUCOSEU 100 (A) 10/11/2006 1026   HGBUR NEGATIVE 10/11/2006 1026   BILIRUBINUR NEGATIVE 10/11/2006 1026   KETONESUR NEGATIVE 10/11/2006 1026   PROTEINUR NEGATIVE 10/11/2006 1026   UROBILINOGEN 0.2 10/11/2006 1026   NITRITE NEGATIVE 10/11/2006 1026   LEUKOCYTESUR  10/11/2006 1026    NEGATIVE MICROSCOPIC NOT DONE ON URINES WITH NEGATIVE PROTEIN, BLOOD, LEUKOCYTES, NITRITE, OR  GLUCOSE <1000 mg/dL.   Sepsis Labs: @LABRCNTIP (procalcitonin:4,lacticidven:4) )No results found for this or any previous visit (from the past 240 hour(s)).   Radiological Exams on Admission: No results found.  EKG: Independently reviewed. NSR, left axis deviation, bifascicular block  Assessment/Plan Principal Problem:   Anemia Active Problems:   Hyperkalemia   Renal failure   DM (diabetes mellitus), type 2 with renal complications (HCC)   HTN (hypertension)   CKD (chronic kidney disease) stage 3, GFR 30-59 ml/min    Anemia -Hb 6.9 on admission. -Will transfuse 2 units of PRBCs. -No signs of active GI bleeding. -Suspect anemia is related to CKD. -May need procrit; will leave decision to nephrology. -Anemia panel requested prior to transfusion as is currently pending.  Acute on CKD Stage III-IV -Suspect  most of it is chronic from DM and HTN. -Will request renal consultation: will need regular followup as an OP for his advanced CKD. Should be prepering for HD.  Hyperkalemia -K 5.7 on admission; no EKG changes. -Give kayexalate and recheck in am.  DM II -Check A1c. -Hold metformin given CKD and risk for lactic acidosis. -Place on a moderate SSI.  HTN -Hold ARB and HCTZ given worsening renal function. -Monitor BP to see if further changes are necessary.   DVT prophylaxis: SCDs  Code Status: full code  Family Communication: patient only  Disposition Plan: Likely DC home in 24 hours  Consults called: Nephrology  Admission status: observation    Time Spent: 65 minutes  Chaya JanHERNANDEZ ACOSTA,[name] MD [facility] Pager [phone]  If 7PM-7AM, please contact night-coverage [email or web address] Password [email or web address]  01/09/2016, 4:01 PM

## 2016-01-09 NOTE — ED Provider Notes (Signed)
[facility]-EMERGENCY DEPT Provider Note   CSN: [id number] Arrival date & time: 01/09/16  1202  By signing my name below, I, Sonum Patel, attest that this documentation has been prepared under the direction and in the presence of Benjiman Core, MD. Electronically Signed: Sonum Patel, Neurosurgeon. 01/09/16. 12:46 PM.  History   Chief Complaint Chief Complaint  Patient presents with  . Low hemoglobin  . Shortness of Breath    The history is provided by the patient and a relative. No language interpreter was used.     HPI Comments: Jerry Lane is a 82 y.o. male with past medical history of DM, HTN, CVA who presents to the Emergency Department today with low hemoglobin levels. Patient had PCP appointment yesterday and had blood work completed; he was called today and advised to come to the ED for a possible blood transfusion. He has recently complained of intermittently feeling cold and SOB. He also reports recent weight loss, states he is unsure how much but his clothes feel looser. He denies associated fever, chills, cough, hematochezia, melena.    Past Medical History:  Diagnosis Date  . Arthritis   . Diabetes mellitus    Type II  . Hypertension   . Stroke [facility])    no residual weakness    Patient Active Problem List   Diagnosis Date Noted  . Hyperkalemia 10/20/2010  . Renal failure 10/20/2010  . DM (diabetes mellitus) (HCC) 10/20/2010  . HTN (hypertension) 10/20/2010  . Cerebrovascular disease 10/20/2010    Past Surgical History:  Procedure Laterality Date  . TIBIA FRACTURE SURGERY  1987   Right, [facility]?       Home Medications    Prior to Admission medications   Medication Sig Start Date End Date Taking? Authorizing Provider  amLODipine (NORVASC) 2.5 MG tablet Take 2.5 mg by mouth daily.      Historical Provider, MD  aspirin 81 MG tablet Take 81 mg by mouth daily. Aspirin 81 mg daily     Historical Provider, MD  metFORMIN (GLUCOPHAGE) 1000 MG tablet Take 1,000 mg by mouth  2 (two) times daily with a meal.      Historical Provider, MD    Family History Family History  Problem Relation Age of Onset  . Anesthesia problems Neg Hx   . Hypotension Neg Hx   . Malignant hyperthermia Neg Hx   . Pseudochol deficiency Neg Hx     Social History Social History  Substance Use Topics  . Smoking status: Former Smoker    Years: 60.00    Types: Cigarettes    Quit date: 10/13/2005  . Smokeless tobacco: Never Used  . Alcohol use No     Allergies   Review of patient's allergies indicates no known allergies.   Review of Systems Review of Systems  Constitutional: Negative for chills and fever.  Respiratory: Positive for shortness of breath. Negative for cough.   Gastrointestinal: Negative for blood in stool.  All other systems reviewed and are negative.    Physical Exam Updated Vital Signs BP 140/64 (BP Location: Left Arm)   Pulse 105   Temp 98.3 F (36.8 C) (Oral)   Resp 20   Wt 130 lb (59 kg)   SpO2 100%   BMI 22.31 kg/m   Physical Exam  Constitutional: He is oriented to person, place, and time. He appears well-developed and well-nourished.  HENT:  Head: Normocephalic and atraumatic.  Cardiovascular: Normal rate, regular rhythm and normal heart sounds.   Pulmonary/Chest: Effort normal  and breath sounds normal. No respiratory distress. He has no wheezes. He has no rales.  Abdominal: Soft. He exhibits no mass.  Musculoskeletal: He exhibits no edema.  Neurological: He is alert and oriented to person, place, and time.  Skin: Skin is warm and dry. There is pallor.  Psychiatric: He has a normal mood and affect.  Nursing note and vitals reviewed.   ED Treatments / Results  DIAGNOSTIC STUDIES: Oxygen Saturation is 100% on RA, normal by my interpretation.    COORDINATION OF CARE: 12:47 PM Discussed treatment plan with pt at bedside and pt agreed to plan.    Labs (all labs ordered are listed, but only abnormal results are displayed) Labs  Reviewed  CBC WITH DIFFERENTIAL/PLATELET  BASIC METABOLIC PANEL  TYPE AND SCREEN    EKG  EKG Interpretation None       Radiology No results found.  Procedures Procedures (including critical care time)  Medications Ordered in ED Medications - No data to display   Initial Impression / Assessment and Plan / ED Course  I have reviewed the triage vital signs and the nursing notes.  Pertinent labs & imaging results that were available during my care of the patient were reviewed by me and considered in my medical decision making (see chart for details).  Clinical Course    Patient with anemia. History of chronic mild anemia but unsure of baseline. Thought at hemoglobin of 6.8 and is guaiac negative. Admit to internal medicine. Also mild hyperkalemia. Discuss with hospitalist and admission observation to medical floor.  Final Clinical Impressions(s) / ED Diagnoses   Final diagnoses:  None    New Prescriptions New Prescriptions   No medications on file   I personally performed the services described in this documentation, which was scribed in my presence. The recorded information has been reviewed and is accurate.      Benjiman CoreNathan Deshayla Empson, MD 01/09/16 [id number]

## 2016-01-09 NOTE — Care Management Obs Status (Signed)
MEDICARE OBSERVATION STATUS NOTIFICATION   Patient Details  Name: Jerry Lane MRN: [id number] Date of Birth: Jun 23, 1927   Medicare Observation Status Notification Given:  Yes    Malcolm MetroChildress, Nekia Maxham Demske, RN 01/09/2016, 3:29 PM

## 2016-01-09 NOTE — ED Notes (Signed)
EDP aware of pt, blood work ordered as requested.

## 2016-01-09 NOTE — Progress Notes (Signed)
RN, Morrison Oldisha, paged NP because pt pulled out his IV before 2nd unit of blood could be given. His Hgb was 6.9 and attending ordered 2 Units. He did receive 1 unit. He will not allow RN to restart IV and refuses any further IV, IVF, or blood tonight. He stated someone told him he only needed one unit. Per attending's note, no active GIB. Will recheck CBC in am and go from there. Will leave note for attending about event. KJKG, NP [facility]

## 2016-01-10 DIAGNOSIS — D500 Iron deficiency anemia secondary to blood loss (chronic): Secondary | ICD-10-CM | POA: Diagnosis not present

## 2016-01-10 DIAGNOSIS — D509 Iron deficiency anemia, unspecified: Secondary | ICD-10-CM

## 2016-01-10 DIAGNOSIS — N183 Chronic kidney disease, stage 3 (moderate): Secondary | ICD-10-CM | POA: Diagnosis not present

## 2016-01-10 DIAGNOSIS — D649 Anemia, unspecified: Secondary | ICD-10-CM | POA: Diagnosis not present

## 2016-01-10 DIAGNOSIS — I10 Essential (primary) hypertension: Secondary | ICD-10-CM | POA: Diagnosis not present

## 2016-01-10 DIAGNOSIS — E875 Hyperkalemia: Secondary | ICD-10-CM | POA: Diagnosis not present

## 2016-01-10 LAB — TYPE AND SCREEN
ABO/RH(D): O POS
Antibody Screen: NEGATIVE
Unit division: 0
Unit division: 0

## 2016-01-10 LAB — BASIC METABOLIC PANEL
ANION GAP: 6 (ref 5–15)
BUN: 28 mg/dL — ABNORMAL HIGH (ref 6–20)
CALCIUM: 9 mg/dL (ref 8.9–10.3)
CO2: 23 mmol/L (ref 22–32)
Chloride: 110 mmol/L (ref 101–111)
Creatinine, Ser: 2.88 mg/dL — ABNORMAL HIGH (ref 0.61–1.24)
GFR, EST AFRICAN AMERICAN: 21 mL/min — AB (ref >60)
GFR, EST NON AFRICAN AMERICAN: 18 mL/min — AB (ref >60)
GLUCOSE: 99 mg/dL (ref 65–99)
POTASSIUM: 4.8 mmol/L (ref 3.5–5.1)
SODIUM: 139 mmol/L (ref 135–145)

## 2016-01-10 LAB — CBC
HCT: 25.7 % — ABNORMAL LOW (ref 39.0–52.0)
HEMOGLOBIN: 8 g/dL — AB (ref 13.0–17.0)
MCH: 25.9 pg — ABNORMAL LOW (ref 26.0–34.0)
MCHC: 31.1 g/dL (ref 30.0–36.0)
MCV: 83.2 fL (ref 78.0–100.0)
Platelets: 266 10*3/uL (ref 150–400)
RBC: 3.09 MIL/uL — ABNORMAL LOW (ref 4.22–5.81)
RDW: 16.8 % — ABNORMAL HIGH (ref 11.5–15.5)
WBC: 6.1 10*3/uL (ref 4.0–10.5)

## 2016-01-10 LAB — HEMOGLOBIN A1C
HEMOGLOBIN A1C: 7.3 % — AB (ref 4.8–5.6)
MEAN PLASMA GLUCOSE: 163 mg/dL

## 2016-01-10 LAB — GLUCOSE, CAPILLARY
GLUCOSE-CAPILLARY: 164 mg/dL — AB (ref 65–99)
Glucose-Capillary: 104 mg/dL — ABNORMAL HIGH (ref 65–99)

## 2016-01-10 MED ORDER — FERROUS SULFATE 325 (65 FE) MG PO TABS: 325.0000 mg | ORAL_TABLET | Freq: Two times a day (BID) | ORAL | 3 refills | Status: DC

## 2016-01-10 MED ORDER — GLIPIZIDE 5 MG PO TABS: 2.5000 mg | ORAL_TABLET | Freq: Every day | ORAL | 2 refills | Status: DC

## 2016-01-10 MED ORDER — SODIUM CHLORIDE 0.9 % IV SOLN
510.0000 mg | Freq: Once | INTRAVENOUS | Status: DC
  Filled 2016-01-10: qty 17

## 2016-01-10 MED ORDER — FERROUS SULFATE 325 (65 FE) MG PO TABS
325.0000 mg | ORAL_TABLET | Freq: Two times a day (BID) | ORAL | Status: DC
  Administered 2016-01-10: 325 mg via ORAL
  Filled 2016-01-10: qty 1

## 2016-01-10 NOTE — Progress Notes (Signed)
Inpatient Diabetes Program Recommendations  AACE/ADA: New Consensus Statement on Inpatient Glycemic Control (2015)  Target Ranges:  Prepandial:   less than 140 mg/dL      Peak postprandial:   less than 180 mg/dL (1-2 hours)      Critically ill patients:  140 - 180 mg/dL   Results for [name], Jerry Lane (MRN [id number]) as of 01/10/2016 09:00  Ref. Range 01/09/2016 17:00 01/09/2016 21:10 01/09/2016 23:26 01/10/2016 07:30  Glucose-Capillary Latest Ref Range: 65 - 99 mg/dL 308168 (H)  Novolog 7 units (4 MC + 3 SSI) 68 81 104 (H)   Review of Glycemic Control  Diabetes history: DM2 Outpatient Diabetes medications: Metformin 1000 mg BID Current orders for Inpatient glycemic control: Novolog 0-15 units TID with meals, Novolog 0-5 units QHS, Novolog 4 units TID with meals for meal coverage  Inpatient Diabetes Program Recommendations: Insulin - Meal Coverage: If patient experiences any other episodes of hypoglycemia, may want to consider discontinuing meal coverage.  Thanks, Orlando PennerMarie Rhylee Pucillo, RN, MSN, CDE Diabetes Coordinator Inpatient Diabetes Program [phone] (Team Pager from 8am to 5pm) [phone] ([facility] office) [phone] [facility] office) [phone] [facility] office)

## 2016-01-10 NOTE — Progress Notes (Signed)
Pt discharged in stable condition into the care of his daughter via wheelchair into private vehicle.  Discharge instructions reviewed with son/pt.  Son/pt verbalized understanding.

## 2016-01-10 NOTE — Discharge Summary (Signed)
Physician Discharge Summary  Jerry Lane ZOX:[id number] DOB: 01/30/1928 DOA: 01/09/2016  PCP: Jerry Courier, MD  Admit date: 01/09/2016 Discharge date: 01/10/2016  Time spent: 45 minutes  Recommendations for Outpatient Follow-up:  -Will be discharged home today. -Advised to follow up with PCP in 2 weeks. -Needs to be referred to nephrology in the OP setting.   Discharge Diagnoses:  Principal Problem:   Anemia Active Problems:   Hyperkalemia   Renal failure   DM (diabetes mellitus), type 2 with renal complications (HCC)   HTN (hypertension)   CKD (chronic kidney disease) stage 3, GFR 30-59 ml/min   Discharge Condition: Stable and improved  Filed Weights   01/09/16 1218  Weight: 59 kg (130 lb)    History of present illness:   Jerry Lane is a 82 y.o. male with h/o DM II, HTN and CKD at least stage III, who went to see his PCP yesterday for a regularly scheduled visit during which labs were drawn. He was called today and told to come to the ED because his blood counts were low. He was found to have a Hb of 6.9. He denies melena, hematochezia, GERD symptoms. He has been complaining of fatigue and mild SOB with ambulation, no CP. FOBT was negative as performed by EDP. He is also noted to have a Cr of 3.12, above from his last known baseline of 2.49 in 2012. He says he used to see a kidney doctor but does not remember the last time he did so. Also noted to have a potassium of 5.7, no EKG abnormalities other than scatered PVCs.  Admission requested.  Hospital Course:   Iron deficiency anemia/anemia of chronic disease -No signs of active GI bleed. -Ferritin is 4. -Has been given a dose of feraheme and will be placed on ferous sulfate. -Received 2 units of PRBCs (altho it appears that second unit was not completed as [name]) -Hb has increased from 6.9 on admission to 8.0 on DC.  Acute on CKD Stage III_IV -Baseline Cr appears to be around 2.6. -Needs OP  follow up with nephrology.  Hyperkalemia -Resolved at time of DC. Received 1 dose of keyexalate on admission.  DM II -Have discontinued metformin due to advanced CKD and risk for lactic acidosis. -Have decided to start low-dose glipizide 2.5 mg daily. -Will need close diabetic monitoring in the OP setting.  HTN -Fair control despite cessation of ARB and HCTZ. -Continue to monitor as an OP and adjust medications as necessary.   Procedures:  None   Consultations:  None  Discharge Instructions  Discharge Instructions    Diet - low sodium heart healthy    Complete by:  As directed    Increase activity slowly    Complete by:  As directed        Medication List    STOP taking these medications   hydrochlorothiazide 12.5 MG capsule Commonly known as:  MICROZIDE   metFORMIN 1000 MG tablet Commonly known as:  GLUCOPHAGE   valsartan 160 MG tablet Commonly known as:  DIOVAN     TAKE these medications   amLODipine 2.5 MG tablet Commonly known as:  NORVASC Take 2.5 mg by mouth daily.   aspirin 81 MG tablet Take 81 mg by mouth daily. Aspirin 81 mg daily   ferrous sulfate 325 (65 FE) MG tablet Take 1 tablet (325 mg total) by mouth 2 (two) times daily with a meal.   glipiZIDE 5 MG tablet  Commonly known as:  GLUCOTROL Take 0.5 tablets (2.5 mg total) by mouth daily before breakfast.   tamsulosin 0.4 MG Caps capsule Commonly known as:  FLOMAX Take 0.4 mg by mouth daily.      No Known Allergies Follow-up Information    Jerry CourierGerald K Hill, MD. Schedule an appointment as soon as possible for a visit in 2 week(s).   Specialty:  Family Medicine Contact information: [address] [place] [address] [phone]            The results of significant diagnostics from this hospitalization (including imaging, microbiology, ancillary and laboratory) are listed below for reference.    Significant Diagnostic Studies: No results found.  Microbiology: No  results found for this or any previous visit (from the past 240 hour(s)).   Labs: Basic Metabolic Panel:  Recent Labs Lab 01/09/16 1236 01/10/16 0602  NA 137 139  K 5.7* 4.8  CL 109 110  CO2 22 23  GLUCOSE 268* 99  BUN 27* 28*  CREATININE 3.12* 2.88*  CALCIUM 8.6* 9.0   Liver Function Tests: No results for input(s): AST, ALT, ALKPHOS, BILITOT, PROT, ALBUMIN in the last 168 hours. No results for input(s): LIPASE, AMYLASE in the last 168 hours. No results for input(s): AMMONIA in the last 168 hours. CBC:  Recent Labs Lab 01/09/16 1236 01/10/16 0602  WBC 4.7 6.1  NEUTROABS 3.0  --   HGB 6.9* 8.0*  HCT 23.1* 25.7*  MCV 85.6 83.2  PLT 272 266   Cardiac Enzymes: No results for input(s): CKTOTAL, CKMB, CKMBINDEX, TROPONINI in the last 168 hours. BNP: BNP (last 3 results) No results for input(s): BNP in the last 8760 hours.  ProBNP (last 3 results) No results for input(s): PROBNP in the last 8760 hours.  CBG:  Recent Labs Lab 01/09/16 1700 01/09/16 2110 01/09/16 2326 01/10/16 0730 01/10/16 1154  GLUCAP 168* 68 81 104* 164*       Signed:  HERNANDEZ Lane  [facility] Pager: [phone] 01/10/2016, 2:37 PM

## 2016-04-28 DIAGNOSIS — D631 Anemia in chronic kidney disease: Secondary | ICD-10-CM | POA: Diagnosis not present

## 2016-04-28 DIAGNOSIS — E1122 Type 2 diabetes mellitus with diabetic chronic kidney disease: Secondary | ICD-10-CM | POA: Diagnosis not present

## 2016-04-28 DIAGNOSIS — I10 Essential (primary) hypertension: Secondary | ICD-10-CM | POA: Diagnosis not present

## 2016-04-28 DIAGNOSIS — N184 Chronic kidney disease, stage 4 (severe): Secondary | ICD-10-CM | POA: Diagnosis not present

## 2016-06-02 DIAGNOSIS — E1169 Type 2 diabetes mellitus with other specified complication: Secondary | ICD-10-CM | POA: Diagnosis not present

## 2016-06-02 DIAGNOSIS — N184 Chronic kidney disease, stage 4 (severe): Secondary | ICD-10-CM | POA: Diagnosis not present

## 2016-06-02 DIAGNOSIS — R51 Headache: Secondary | ICD-10-CM | POA: Diagnosis not present

## 2016-06-02 DIAGNOSIS — E1122 Type 2 diabetes mellitus with diabetic chronic kidney disease: Secondary | ICD-10-CM | POA: Diagnosis not present

## 2016-06-02 DIAGNOSIS — I10 Essential (primary) hypertension: Secondary | ICD-10-CM | POA: Diagnosis not present

## 2016-06-02 DIAGNOSIS — D631 Anemia in chronic kidney disease: Secondary | ICD-10-CM | POA: Diagnosis not present

## 2016-06-10 DIAGNOSIS — N184 Chronic kidney disease, stage 4 (severe): Secondary | ICD-10-CM | POA: Diagnosis not present

## 2016-06-10 DIAGNOSIS — E876 Hypokalemia: Secondary | ICD-10-CM | POA: Diagnosis not present

## 2016-07-07 DIAGNOSIS — R530 Neoplastic (malignant) related fatigue: Secondary | ICD-10-CM | POA: Diagnosis not present

## 2016-07-07 DIAGNOSIS — E1122 Type 2 diabetes mellitus with diabetic chronic kidney disease: Secondary | ICD-10-CM | POA: Diagnosis not present

## 2016-07-07 DIAGNOSIS — N184 Chronic kidney disease, stage 4 (severe): Secondary | ICD-10-CM | POA: Diagnosis not present

## 2016-07-07 DIAGNOSIS — I69959 Hemiplegia and hemiparesis following unspecified cerebrovascular disease affecting unspecified side: Secondary | ICD-10-CM | POA: Diagnosis not present

## 2016-07-07 DIAGNOSIS — D649 Anemia, unspecified: Secondary | ICD-10-CM | POA: Diagnosis not present

## 2016-07-13 ENCOUNTER — Encounter (HOSPITAL_COMMUNITY): Payer: Self-pay

## 2016-07-13 ENCOUNTER — Emergency Department (HOSPITAL_COMMUNITY)
Admission: EM | Admit: 2016-07-13 | Discharge: 2016-07-13 | Disposition: A | Discharge status: Home / Self Care | Payer: Medicare Other | Attending: Emergency Medicine | Admitting: Emergency Medicine

## 2016-07-13 ENCOUNTER — Emergency Department (HOSPITAL_COMMUNITY): Payer: Medicare Other

## 2016-07-13 DIAGNOSIS — N183 Chronic kidney disease, stage 3 (moderate): Secondary | ICD-10-CM | POA: Insufficient documentation

## 2016-07-13 DIAGNOSIS — E1122 Type 2 diabetes mellitus with diabetic chronic kidney disease: Secondary | ICD-10-CM | POA: Insufficient documentation

## 2016-07-13 DIAGNOSIS — R269 Unspecified abnormalities of gait and mobility: Secondary | ICD-10-CM | POA: Diagnosis not present

## 2016-07-13 DIAGNOSIS — Z7982 Long term (current) use of aspirin: Secondary | ICD-10-CM | POA: Insufficient documentation

## 2016-07-13 DIAGNOSIS — I129 Hypertensive chronic kidney disease with stage 1 through stage 4 chronic kidney disease, or unspecified chronic kidney disease: Secondary | ICD-10-CM | POA: Diagnosis not present

## 2016-07-13 DIAGNOSIS — I6789 Other cerebrovascular disease: Secondary | ICD-10-CM | POA: Diagnosis not present

## 2016-07-13 DIAGNOSIS — F0390 Unspecified dementia without behavioral disturbance: Secondary | ICD-10-CM | POA: Diagnosis not present

## 2016-07-13 DIAGNOSIS — Z79899 Other long term (current) drug therapy: Secondary | ICD-10-CM | POA: Insufficient documentation

## 2016-07-13 DIAGNOSIS — R531 Weakness: Secondary | ICD-10-CM | POA: Diagnosis not present

## 2016-07-13 DIAGNOSIS — Z87891 Personal history of nicotine dependence: Secondary | ICD-10-CM | POA: Diagnosis not present

## 2016-07-13 LAB — URINALYSIS, ROUTINE W REFLEX MICROSCOPIC
BILIRUBIN URINE: NEGATIVE
Bacteria, UA: NONE SEEN
Glucose, UA: 50 mg/dL — AB
HGB URINE DIPSTICK: NEGATIVE
Ketones, ur: NEGATIVE mg/dL
Leukocytes, UA: NEGATIVE
NITRITE: NEGATIVE
Protein, ur: 30 mg/dL — AB
SPECIFIC GRAVITY, URINE: 1.016 (ref 1.005–1.030)
pH: 5 (ref 5.0–8.0)

## 2016-07-13 LAB — CBC WITH DIFFERENTIAL/PLATELET
BASOS ABS: 0 10*3/uL (ref 0.0–0.1)
BASOS PCT: 1 %
EOS ABS: 0.1 10*3/uL (ref 0.0–0.7)
Eosinophils Relative: 1 %
HCT: 34.3 % — ABNORMAL LOW (ref 39.0–52.0)
HEMOGLOBIN: 10.5 g/dL — AB (ref 13.0–17.0)
Lymphocytes Relative: 9 %
Lymphs Abs: 0.5 10*3/uL — ABNORMAL LOW (ref 0.7–4.0)
MCH: 26.3 pg (ref 26.0–34.0)
MCHC: 30.6 g/dL (ref 30.0–36.0)
MCV: 86 fL (ref 78.0–100.0)
Monocytes Absolute: 0.4 10*3/uL (ref 0.1–1.0)
Monocytes Relative: 7 %
Neutro Abs: 4.6 10*3/uL (ref 1.7–7.7)
Neutrophils Relative %: 82 %
Platelets: 241 10*3/uL (ref 150–400)
RBC: 3.99 MIL/uL — AB (ref 4.22–5.81)
RDW: 14.7 % (ref 11.5–15.5)
WBC: 5.5 10*3/uL (ref 4.0–10.5)

## 2016-07-13 LAB — BASIC METABOLIC PANEL
Anion gap: 12 (ref 5–15)
BUN: 45 mg/dL — AB (ref 6–20)
CALCIUM: 9.5 mg/dL (ref 8.9–10.3)
CHLORIDE: 106 mmol/L (ref 101–111)
CO2: 19 mmol/L — ABNORMAL LOW (ref 22–32)
CREATININE: 3.12 mg/dL — AB (ref 0.61–1.24)
GFR, EST AFRICAN AMERICAN: 19 mL/min — AB (ref >60)
GFR, EST NON AFRICAN AMERICAN: 17 mL/min — AB (ref >60)
Glucose, Bld: 220 mg/dL — ABNORMAL HIGH (ref 65–99)
POTASSIUM: 4.9 mmol/L (ref 3.5–5.1)
SODIUM: 137 mmol/L (ref 135–145)

## 2016-07-13 NOTE — ED Triage Notes (Signed)
Patient brought to ED by [facility] with [place] [employer] at bedside. Per PD patient was dropped off at [facility] by family today and unable to get in contact with family for information. [facility] sent patient here to have a medical clearance. Patient has hx of dementia. Per [facility], [facility] states they will accept patient back at facility.

## 2016-07-13 NOTE — ED Provider Notes (Signed)
[facility]-EMERGENCY DEPT Provider Note   CSN: [id number] Arrival date & time: 07/13/16  1652     History   Chief Complaint Chief Complaint  Patient presents with  . Medical Clearance   Level V caveat due to dementia HPI Jerry Lane is a 83 y.o. male.  HPI Patient sent in from nursing home for medical clearance. Poorly has dropped off at [facility] without much information. Facility was unable to get in touch with the family. Patient is not sure why he is here. Reported history of dementia. Apparently has a bad back for. Patient is without complaints.   Past Medical History:  Diagnosis Date  . Arthritis   . Diabetes mellitus    Type II  . Hypertension   . Stroke [facility])    no residual weakness    Patient Active Problem List   Diagnosis Date Noted  . Anemia 01/09/2016  . CKD (chronic kidney disease) stage 3, GFR 30-59 ml/min 01/09/2016  . Hyperkalemia 10/20/2010  . Renal failure 10/20/2010  . DM (diabetes mellitus), type 2 with renal complications (HCC) 10/20/2010  . HTN (hypertension) 10/20/2010  . Cerebrovascular disease 10/20/2010    Past Surgical History:  Procedure Laterality Date  . TIBIA FRACTURE SURGERY  1987   Right, [facility]?       Home Medications    Prior to Admission medications   Medication Sig Start Date End Date Taking? Authorizing Provider  amLODipine (NORVASC) 2.5 MG tablet Take 2.5 mg by mouth daily.     Yes Historical Provider, MD  aspirin 81 MG tablet Take 81 mg by mouth daily. Aspirin 81 mg daily    Yes Historical Provider, MD  ferrous sulfate 325 (65 FE) MG tablet Take 1 tablet (325 mg total) by mouth 2 (two) times daily with a meal. 01/10/16  Yes [name] Isaiah Blakes, MD  tamsulosin (FLOMAX) 0.4 MG CAPS capsule Take 0.4 mg by mouth daily.   Yes Historical Provider, MD  TRADJENTA 5 MG TABS tablet Take 5 mg by mouth daily.  06/02/16  Yes Historical Provider, MD    Family History Family History  Problem Relation Age of Onset  . Anesthesia  problems Neg Hx   . Hypotension Neg Hx   . Malignant hyperthermia Neg Hx   . Pseudochol deficiency Neg Hx     Social History Social History  Substance Use Topics  . Smoking status: Former Smoker    Years: 60.00    Types: Cigarettes    Quit date: 10/13/2005  . Smokeless tobacco: Never Used  . Alcohol use No     Allergies   Patient has no known allergies.   Review of Systems Review of Systems  Unable to perform ROS: Dementia  Constitutional: Negative for appetite change.     Physical Exam Updated Vital Signs BP 134/77 (BP Location: Left Arm)   Pulse 66   Temp 98.3 F (36.8 C) (Oral)   Resp 15   Ht  (1.727 m)   Wt 135 lb (61.2 kg)   SpO2 99%   BMI 20.53 kg/m   Physical Exam  Constitutional: He appears well-developed.  HENT:  Head: Atraumatic.  Neck: Neck supple.  Cardiovascular: Normal rate.   Pulmonary/Chest: Effort normal.  Abdominal: Soft. There is no tenderness.  Musculoskeletal: He exhibits no edema.  Neurological: He is alert.  Patient is oriented to self. Can tell me is in the hospital. States the date is April 8 when it is active at night. Was not able to  easily tolerate the year. Not able to tell me the president's name but said that he is a crook.   Skin: Skin is warm. Capillary refill takes less than 2 seconds.     ED Treatments / Results  Labs (all labs ordered are listed, but only abnormal results are displayed) Labs Reviewed  BASIC METABOLIC PANEL - Abnormal; Notable for the following:       Result Value   CO2 19 (*)    Glucose, Bld 220 (*)    BUN 45 (*)    Creatinine, Ser 3.12 (*)    GFR calc non Af Amer 17 (*)    GFR calc Af Amer 19 (*)    All other components within normal limits  CBC WITH DIFFERENTIAL/PLATELET - Abnormal; Notable for the following:    RBC 3.99 (*)    Hemoglobin 10.5 (*)    HCT 34.3 (*)    Lymphs Abs 0.5 (*)    All other components within normal limits  URINALYSIS, ROUTINE W REFLEX MICROSCOPIC - Abnormal;  Notable for the following:    Glucose, UA 50 (*)    Protein, ur 30 (*)    Squamous Epithelial / LPF 0-5 (*)    All other components within normal limits    EKG  EKG Interpretation None       Radiology Dg Chest 2 View  Result Date: 07/13/2016 CLINICAL DATA:  Weakness EXAM: CHEST  2 VIEW COMPARISON:  01/26/2005 FINDINGS: Cardiac shadow is stable. Mild aortic calcifications are seen. The lungs are well aerated bilaterally with chronic interstitial changes. No focal confluent infiltrate is seen. Scarring is noted in the apices bilaterally. No effusion or acute bony abnormality is noted. IMPRESSION: Chronic changes without acute abnormality. Electronically Signed   By: Alcide Clever M.D.   On: 07/13/2016 17:36   Ct Head Wo Contrast  Result Date: 07/13/2016 CLINICAL DATA:  83 year old male with dementia. Medical clearance. Initial encounter. EXAM: CT HEAD WITHOUT CONTRAST TECHNIQUE: Contiguous axial images were obtained from the base of the skull through the vertex without intravenous contrast. COMPARISON:  None. FINDINGS: Brain: No intracranial hemorrhage or CT evidence of large acute infarct. Moderate global atrophy. Remote basal ganglia infarcts and right paracentral pontine infarct. Prominent chronic microvascular changes. No intracranial mass lesion noted on this unenhanced exam. Vascular: Vascular calcifications. Skull: Negative Sinuses/Orbits: No acute orbital abnormality. Remote right maxillary sinus and possibly remote nasal bone fracture. Visualized sinuses are clear. Other: Negative. IMPRESSION: No acute intracranial abnormality. Specifically, no intracranial hemorrhage or CT evidence of large acute infarct. Remote basal ganglia infarcts and right paracentral infarct. Prominent chronic microvascular changes. Moderate global atrophy. Electronically Signed   By: Lacy Duverney M.D.   On: 07/13/2016 19:21    Procedures Procedures (including critical care time)  Medications Ordered in  ED Medications - No data to display   Initial Impression / Assessment and Plan / ED Course  I have reviewed the triage vital signs and the nursing notes.  Pertinent labs & imaging results that were available during my care of the patient were reviewed by me and considered in my medical decision making (see chart for details).     Patient was sent from nursing home for medical clearance. Reportedly they did not have much information on him. At this time he appears to have some dementia but otherwise medically cleared. Will transfer back to nursing home.  Final Clinical Impressions(s) / ED Diagnoses   Final diagnoses:  Dementia without behavioral disturbance, unspecified dementia type  New Prescriptions New Prescriptions   No medications on file     Benjiman Core, MD 07/13/16 [phone]

## 2016-07-13 NOTE — ED Notes (Signed)
[facility] called back and stated they did not find any belongings

## 2016-07-13 NOTE — ED Notes (Signed)
Officer Crabstree from [place] [employer] came to ED and told writer he got in touch with patients family and they did not take patient to [facility], a neighbor did.  Officer Michail Jewels spoke to patients nephew Marcin Holte and Clinical research associate listened to conversation between Technical sales engineer and [facility] stating that [facility] will be here to take patient home.

## 2016-07-13 NOTE — ED Notes (Signed)
Pt's family member asking about pt's cane and hat; [facility] called and asked if belongings were left over at facility; pt was brought by ems; [facility] called and asked if belongings left; awaiting answer

## 2016-07-21 DIAGNOSIS — R2689 Other abnormalities of gait and mobility: Secondary | ICD-10-CM | POA: Diagnosis not present

## 2016-07-21 DIAGNOSIS — E1129 Type 2 diabetes mellitus with other diabetic kidney complication: Secondary | ICD-10-CM | POA: Diagnosis not present

## 2016-07-21 DIAGNOSIS — I679 Cerebrovascular disease, unspecified: Secondary | ICD-10-CM | POA: Diagnosis not present

## 2016-07-21 DIAGNOSIS — D649 Anemia, unspecified: Secondary | ICD-10-CM | POA: Diagnosis not present

## 2016-07-21 DIAGNOSIS — N183 Chronic kidney disease, stage 3 (moderate): Secondary | ICD-10-CM | POA: Diagnosis not present

## 2016-08-20 DIAGNOSIS — N183 Chronic kidney disease, stage 3 (moderate): Secondary | ICD-10-CM | POA: Diagnosis not present

## 2016-08-20 DIAGNOSIS — R2689 Other abnormalities of gait and mobility: Secondary | ICD-10-CM | POA: Diagnosis not present

## 2016-08-21 DIAGNOSIS — E119 Type 2 diabetes mellitus without complications: Secondary | ICD-10-CM | POA: Diagnosis not present

## 2016-08-24 DIAGNOSIS — E1122 Type 2 diabetes mellitus with diabetic chronic kidney disease: Secondary | ICD-10-CM | POA: Diagnosis not present

## 2016-08-24 DIAGNOSIS — E1142 Type 2 diabetes mellitus with diabetic polyneuropathy: Secondary | ICD-10-CM | POA: Diagnosis not present

## 2016-08-24 DIAGNOSIS — I10 Essential (primary) hypertension: Secondary | ICD-10-CM | POA: Diagnosis not present

## 2016-08-24 DIAGNOSIS — R972 Elevated prostate specific antigen [PSA]: Secondary | ICD-10-CM | POA: Diagnosis not present

## 2016-08-24 DIAGNOSIS — N184 Chronic kidney disease, stage 4 (severe): Secondary | ICD-10-CM | POA: Diagnosis not present

## 2016-09-20 DIAGNOSIS — R2689 Other abnormalities of gait and mobility: Secondary | ICD-10-CM | POA: Diagnosis not present

## 2016-09-20 DIAGNOSIS — N183 Chronic kidney disease, stage 3 (moderate): Secondary | ICD-10-CM | POA: Diagnosis not present

## 2016-10-20 DIAGNOSIS — R2689 Other abnormalities of gait and mobility: Secondary | ICD-10-CM | POA: Diagnosis not present

## 2016-10-20 DIAGNOSIS — N183 Chronic kidney disease, stage 3 (moderate): Secondary | ICD-10-CM | POA: Diagnosis not present

## 2016-11-20 DIAGNOSIS — R2689 Other abnormalities of gait and mobility: Secondary | ICD-10-CM | POA: Diagnosis not present

## 2016-11-20 DIAGNOSIS — N183 Chronic kidney disease, stage 3 (moderate): Secondary | ICD-10-CM | POA: Diagnosis not present

## 2016-11-24 DIAGNOSIS — Z0000 Encounter for general adult medical examination without abnormal findings: Secondary | ICD-10-CM | POA: Diagnosis not present

## 2016-11-24 DIAGNOSIS — I10 Essential (primary) hypertension: Secondary | ICD-10-CM | POA: Diagnosis not present

## 2016-11-24 DIAGNOSIS — E1122 Type 2 diabetes mellitus with diabetic chronic kidney disease: Secondary | ICD-10-CM | POA: Diagnosis not present

## 2016-11-24 DIAGNOSIS — N184 Chronic kidney disease, stage 4 (severe): Secondary | ICD-10-CM | POA: Diagnosis not present

## 2016-11-25 DIAGNOSIS — E876 Hypokalemia: Secondary | ICD-10-CM | POA: Diagnosis not present

## 2016-11-25 DIAGNOSIS — I10 Essential (primary) hypertension: Secondary | ICD-10-CM | POA: Diagnosis not present

## 2016-11-25 DIAGNOSIS — N184 Chronic kidney disease, stage 4 (severe): Secondary | ICD-10-CM | POA: Diagnosis not present

## 2016-11-25 DIAGNOSIS — E1122 Type 2 diabetes mellitus with diabetic chronic kidney disease: Secondary | ICD-10-CM | POA: Diagnosis not present

## 2016-12-21 DIAGNOSIS — N183 Chronic kidney disease, stage 3 (moderate): Secondary | ICD-10-CM | POA: Diagnosis not present

## 2016-12-21 DIAGNOSIS — R2689 Other abnormalities of gait and mobility: Secondary | ICD-10-CM | POA: Diagnosis not present

## 2016-12-25 ENCOUNTER — Inpatient Hospital Stay (HOSPITAL_COMMUNITY)
Admission: EM | Admit: 2016-12-25 | Discharge: 2016-12-30 | DRG: 064 | Disposition: A | Discharge status: Skilled Nursing Facility | Payer: Medicare Other | Attending: Internal Medicine | Admitting: Internal Medicine

## 2016-12-25 ENCOUNTER — Emergency Department (HOSPITAL_COMMUNITY): Payer: Medicare Other

## 2016-12-25 DIAGNOSIS — J849 Interstitial pulmonary disease, unspecified: Secondary | ICD-10-CM | POA: Diagnosis not present

## 2016-12-25 DIAGNOSIS — R531 Weakness: Secondary | ICD-10-CM | POA: Diagnosis not present

## 2016-12-25 DIAGNOSIS — N183 Chronic kidney disease, stage 3 (moderate): Secondary | ICD-10-CM | POA: Diagnosis not present

## 2016-12-25 DIAGNOSIS — I10 Essential (primary) hypertension: Secondary | ICD-10-CM

## 2016-12-25 DIAGNOSIS — E875 Hyperkalemia: Secondary | ICD-10-CM | POA: Diagnosis not present

## 2016-12-25 DIAGNOSIS — F0390 Unspecified dementia without behavioral disturbance: Secondary | ICD-10-CM | POA: Diagnosis present

## 2016-12-25 DIAGNOSIS — R52 Pain, unspecified: Secondary | ICD-10-CM | POA: Diagnosis not present

## 2016-12-25 DIAGNOSIS — N390 Urinary tract infection, site not specified: Secondary | ICD-10-CM | POA: Diagnosis not present

## 2016-12-25 DIAGNOSIS — N184 Chronic kidney disease, stage 4 (severe): Secondary | ICD-10-CM | POA: Diagnosis not present

## 2016-12-25 DIAGNOSIS — D638 Anemia in other chronic diseases classified elsewhere: Secondary | ICD-10-CM | POA: Diagnosis present

## 2016-12-25 DIAGNOSIS — E785 Hyperlipidemia, unspecified: Secondary | ICD-10-CM | POA: Diagnosis present

## 2016-12-25 DIAGNOSIS — I63231 Cerebral infarction due to unspecified occlusion or stenosis of right carotid arteries: Secondary | ICD-10-CM | POA: Diagnosis not present

## 2016-12-25 DIAGNOSIS — Z6822 Body mass index (BMI) 22.0-22.9, adult: Secondary | ICD-10-CM

## 2016-12-25 DIAGNOSIS — Z7984 Long term (current) use of oral hypoglycemic drugs: Secondary | ICD-10-CM

## 2016-12-25 DIAGNOSIS — L89152 Pressure ulcer of sacral region, stage 2: Secondary | ICD-10-CM | POA: Diagnosis present

## 2016-12-25 DIAGNOSIS — R05 Cough: Secondary | ICD-10-CM | POA: Diagnosis not present

## 2016-12-25 DIAGNOSIS — G9341 Metabolic encephalopathy: Secondary | ICD-10-CM | POA: Diagnosis not present

## 2016-12-25 DIAGNOSIS — E1122 Type 2 diabetes mellitus with diabetic chronic kidney disease: Secondary | ICD-10-CM | POA: Diagnosis not present

## 2016-12-25 DIAGNOSIS — R1312 Dysphagia, oropharyngeal phase: Secondary | ICD-10-CM | POA: Diagnosis not present

## 2016-12-25 DIAGNOSIS — I6789 Other cerebrovascular disease: Secondary | ICD-10-CM | POA: Diagnosis not present

## 2016-12-25 DIAGNOSIS — I63541 Cerebral infarction due to unspecified occlusion or stenosis of right cerebellar artery: Principal | ICD-10-CM | POA: Diagnosis present

## 2016-12-25 DIAGNOSIS — I63239 Cerebral infarction due to unspecified occlusion or stenosis of unspecified carotid arteries: Secondary | ICD-10-CM

## 2016-12-25 DIAGNOSIS — Z7982 Long term (current) use of aspirin: Secondary | ICD-10-CM | POA: Diagnosis not present

## 2016-12-25 DIAGNOSIS — R29707 NIHSS score 7: Secondary | ICD-10-CM | POA: Diagnosis present

## 2016-12-25 DIAGNOSIS — N179 Acute kidney failure, unspecified: Secondary | ICD-10-CM | POA: Diagnosis not present

## 2016-12-25 DIAGNOSIS — R471 Dysarthria and anarthria: Secondary | ICD-10-CM | POA: Diagnosis not present

## 2016-12-25 DIAGNOSIS — G464 Cerebellar stroke syndrome: Secondary | ICD-10-CM | POA: Diagnosis not present

## 2016-12-25 DIAGNOSIS — R29818 Other symptoms and signs involving the nervous system: Secondary | ICD-10-CM | POA: Diagnosis not present

## 2016-12-25 DIAGNOSIS — L8990 Pressure ulcer of unspecified site, unspecified stage: Secondary | ICD-10-CM | POA: Insufficient documentation

## 2016-12-25 DIAGNOSIS — Z87891 Personal history of nicotine dependence: Secondary | ICD-10-CM | POA: Diagnosis not present

## 2016-12-25 DIAGNOSIS — D649 Anemia, unspecified: Secondary | ICD-10-CM | POA: Diagnosis not present

## 2016-12-25 DIAGNOSIS — I4891 Unspecified atrial fibrillation: Secondary | ICD-10-CM | POA: Diagnosis not present

## 2016-12-25 DIAGNOSIS — E119 Type 2 diabetes mellitus without complications: Secondary | ICD-10-CM

## 2016-12-25 DIAGNOSIS — E118 Type 2 diabetes mellitus with unspecified complications: Secondary | ICD-10-CM | POA: Diagnosis not present

## 2016-12-25 DIAGNOSIS — I69354 Hemiplegia and hemiparesis following cerebral infarction affecting left non-dominant side: Secondary | ICD-10-CM | POA: Diagnosis not present

## 2016-12-25 DIAGNOSIS — I639 Cerebral infarction, unspecified: Secondary | ICD-10-CM | POA: Diagnosis not present

## 2016-12-25 DIAGNOSIS — R270 Ataxia, unspecified: Secondary | ICD-10-CM | POA: Diagnosis not present

## 2016-12-25 DIAGNOSIS — R4781 Slurred speech: Secondary | ICD-10-CM | POA: Diagnosis not present

## 2016-12-25 DIAGNOSIS — I129 Hypertensive chronic kidney disease with stage 1 through stage 4 chronic kidney disease, or unspecified chronic kidney disease: Secondary | ICD-10-CM | POA: Diagnosis present

## 2016-12-25 DIAGNOSIS — R2681 Unsteadiness on feet: Secondary | ICD-10-CM | POA: Diagnosis not present

## 2016-12-25 DIAGNOSIS — B9620 Unspecified Escherichia coli [E. coli] as the cause of diseases classified elsewhere: Secondary | ICD-10-CM | POA: Diagnosis present

## 2016-12-25 DIAGNOSIS — M1380 Other specified arthritis, unspecified site: Secondary | ICD-10-CM | POA: Diagnosis not present

## 2016-12-25 DIAGNOSIS — E1322 Other specified diabetes mellitus with diabetic chronic kidney disease: Secondary | ICD-10-CM | POA: Diagnosis not present

## 2016-12-25 DIAGNOSIS — E43 Unspecified severe protein-calorie malnutrition: Secondary | ICD-10-CM | POA: Diagnosis present

## 2016-12-25 DIAGNOSIS — M6281 Muscle weakness (generalized): Secondary | ICD-10-CM | POA: Diagnosis not present

## 2016-12-25 LAB — COMPREHENSIVE METABOLIC PANEL
ALK PHOS: 66 U/L (ref 38–126)
ALT: 18 U/L (ref 17–63)
ANION GAP: 10 (ref 5–15)
AST: 20 U/L (ref 15–41)
Albumin: 3.5 g/dL (ref 3.5–5.0)
BILIRUBIN TOTAL: 0.4 mg/dL (ref 0.3–1.2)
BUN: 62 mg/dL — ABNORMAL HIGH (ref 6–20)
CALCIUM: 8.7 mg/dL — AB (ref 8.9–10.3)
CO2: 20 mmol/L — ABNORMAL LOW (ref 22–32)
Chloride: 106 mmol/L (ref 101–111)
Creatinine, Ser: 3.6 mg/dL — ABNORMAL HIGH (ref 0.61–1.24)
GFR calc non Af Amer: 14 mL/min — ABNORMAL LOW (ref >60)
GFR, EST AFRICAN AMERICAN: 16 mL/min — AB (ref >60)
GLUCOSE: 225 mg/dL — AB (ref 65–99)
Potassium: 4.4 mmol/L (ref 3.5–5.1)
Sodium: 136 mmol/L (ref 135–145)
TOTAL PROTEIN: 8.1 g/dL (ref 6.5–8.1)

## 2016-12-25 LAB — DIFFERENTIAL
Basophils Absolute: 0 10*3/uL (ref 0.0–0.1)
Basophils Relative: 0 %
EOS PCT: 2 %
Eosinophils Absolute: 0.1 10*3/uL (ref 0.0–0.7)
LYMPHS ABS: 0.9 10*3/uL (ref 0.7–4.0)
LYMPHS PCT: 15 %
MONO ABS: 0.3 10*3/uL (ref 0.1–1.0)
MONOS PCT: 6 %
Neutro Abs: 4.6 10*3/uL (ref 1.7–7.7)
Neutrophils Relative %: 77 %

## 2016-12-25 LAB — CBC
HCT: 32.5 % — ABNORMAL LOW (ref 39.0–52.0)
HEMOGLOBIN: 10.6 g/dL — AB (ref 13.0–17.0)
MCH: 28 pg (ref 26.0–34.0)
MCHC: 32.6 g/dL (ref 30.0–36.0)
MCV: 85.8 fL (ref 78.0–100.0)
Platelets: 398 10*3/uL (ref 150–400)
RBC: 3.79 MIL/uL — AB (ref 4.22–5.81)
RDW: 14.5 % (ref 11.5–15.5)
WBC: 5.9 10*3/uL (ref 4.0–10.5)

## 2016-12-25 LAB — URINALYSIS, ROUTINE W REFLEX MICROSCOPIC
BILIRUBIN URINE: NEGATIVE
GLUCOSE, UA: NEGATIVE mg/dL
HGB URINE DIPSTICK: NEGATIVE
Ketones, ur: NEGATIVE mg/dL
NITRITE: NEGATIVE
PH: 5 (ref 5.0–8.0)
Protein, ur: 30 mg/dL — AB
SPECIFIC GRAVITY, URINE: 1.015 (ref 1.005–1.030)

## 2016-12-25 LAB — ETHANOL: Alcohol, Ethyl (B): <5 mg/dL (ref <5)

## 2016-12-25 LAB — I-STAT CHEM 8, ED
BUN: 56 mg/dL — AB (ref 6–20)
CALCIUM ION: 1.13 mmol/L — AB (ref 1.15–1.40)
CREATININE: 3.5 mg/dL — AB (ref 0.61–1.24)
Chloride: 107 mmol/L (ref 101–111)
Glucose, Bld: 204 mg/dL — ABNORMAL HIGH (ref 65–99)
HCT: 38 % — ABNORMAL LOW (ref 39.0–52.0)
Hemoglobin: 12.9 g/dL — ABNORMAL LOW (ref 13.0–17.0)
Potassium: 4.8 mmol/L (ref 3.5–5.1)
SODIUM: 138 mmol/L (ref 135–145)
TCO2: 21 mmol/L — AB (ref 22–32)

## 2016-12-25 LAB — RAPID URINE DRUG SCREEN, HOSP PERFORMED
Amphetamines: NOT DETECTED
Barbiturates: NOT DETECTED
Benzodiazepines: NOT DETECTED
Cocaine: NOT DETECTED
OPIATES: NOT DETECTED
TETRAHYDROCANNABINOL: NOT DETECTED

## 2016-12-25 LAB — PROTIME-INR
INR: 1.16
Prothrombin Time: 14.7 seconds (ref 11.4–15.2)

## 2016-12-25 LAB — I-STAT TROPONIN, ED: Troponin i, poc: 0 ng/mL (ref 0.00–0.08)

## 2016-12-25 LAB — CBG MONITORING, ED: GLUCOSE-CAPILLARY: 189 mg/dL — AB (ref 65–99)

## 2016-12-25 LAB — APTT: aPTT: 35 seconds (ref 24–36)

## 2016-12-25 MED ORDER — LEVOFLOXACIN IN D5W 500 MG/100ML IV SOLN
500.0000 mg | Freq: Once | INTRAVENOUS | Status: AC
  Administered 2016-12-25: 500 mg via INTRAVENOUS
  Filled 2016-12-25: qty 100

## 2016-12-25 NOTE — ED Notes (Signed)
Reported called to 5 Chad, Secondary school teacher.

## 2016-12-25 NOTE — ED Notes (Addendum)
Per Dr Sharl Ma, antibiotic not given until blood cultures obtained.

## 2016-12-25 NOTE — H&P (Addendum)
[facility] H&P    Patient Demographics:    Jerry Lane, is a 83 y.o. male  MRN: [id number]  DOB - 07-28-1927  Admit Date - 12/25/2016  Referring MD/NP/PA: Dr Estell Harpin  Outpatient Primary MD for the patient is Mirna Mires, MD  Patient coming from: home  Chief Complaint  Patient presents with  . Code Stroke      HPI:    Jerry Lane  is a 83 y.o. male, With history of diabetes, hypertension, stroke with residual weakness in left arm who came to hospital after patient was found to be weak on left side around 3:30 PM. Patient was unable to ambulate due to weakness in his left leg. He denies slurred speech. No passing out. No seizure-like activity. Patient was seen in the ED and code stroke was called, patient was evaluated by tele neurology, and no TPA was given. CT head showed Question tiny approximate 1 cm evolving hypodensity at the posterior right frontal lobe, precentral gyrus, which may reflect a tiny acute cortical ischemic infarct. MRI was recommended.  Patient denies chest pain, no shortness of breath. Denies dysuria. No abdominal pain. No nausea vomiting or diarrhea. Patient also had chest x-ray which showed questionable pneumonia, CT chest was performed which did not show pneumonia. He had abnormal UA with too numerous to count WBCs. Started on IV Levaquin. Blood and urine cultures have been obtained.    Review of systems:      All other systems reviewed and are negative.   With Past History of the following :    Past Medical History:  Diagnosis Date  . Arthritis   . Diabetes mellitus    Type II  . Hypertension   . Stroke [facility])    no residual weakness      Past Surgical History:  Procedure Laterality Date  . TIBIA FRACTURE SURGERY  1987   Right, [facility]?      Social History:      Social History  Substance Use Topics  . Smoking status: Former Smoker    Years: 60.00    Types:  Cigarettes    Quit date: 10/13/2005  . Smokeless tobacco: Never Used  . Alcohol use No       Family History :     Family History  Problem Relation Age of Onset  . Anesthesia problems Neg Hx   . Hypotension Neg Hx   . Malignant hyperthermia Neg Hx   . Pseudochol deficiency Neg Hx       Home Medications:   Prior to Admission medications   Medication Sig Start Date End Date Taking? Authorizing Provider  acetaminophen (TYLENOL) 500 MG tablet Take 500 mg by mouth every 6 (six) hours as needed for mild pain or moderate pain.   Yes [provider]  amLODipine (NORVASC) 2.5 MG tablet Take 2.5 mg by mouth daily.     Yes [provider]  bisacodyl (DULCOLAX) 5 MG EC tablet Take 5 mg by mouth daily.   Yes [provider]  TRADJENTA 5 MG TABS tablet Take  5 mg by mouth daily.  06/02/16  Yes [provider]     Allergies:    No Known Allergies   Physical Exam:   Vitals  Blood pressure (!) 153/55, pulse 66, temperature 98.7 F (37.1 C), temperature source Oral, resp. rate 16, height  (1.727 m), weight 65.8 kg (145 lb), SpO2 99 %.  1.  General: Appears in no acute distress  2. Psychiatric:  Intact judgement and  insight, awake alert, oriented x 3.  3. Neurologic: Motor strength 0/5 in left upper extremity, 5/5 in all other extremities. Sensations intact. Extraocular muscles intact. No other focal deficit noted at this time. Plantars downgoing bilaterally.  4. Eyes :  anicteric sclerae, moist conjunctivae with no lid lag. PERRLA.  5. ENMT:  Oropharynx clear with moist mucous membranes and good dentition  6. Neck:  supple, no cervical lymphadenopathy appriciated, No thyromegaly  7. Respiratory : Normal respiratory effort, good air movement bilaterally,clear to  auscultation bilaterally  8. Cardiovascular : RRR, no gallops, rubs or murmurs, no leg edema  9. Gastrointestinal:  Positive bowel sounds, abdomen soft, non-tender to  palpation,no hepatosplenomegaly, no rigidity or guarding       10. Skin:  No cyanosis, normal texture and turgor, no rash, lesions or ulcers  11.Musculoskeletal:  Good muscle tone,  joints appear normal , no effusions,  normal range of motion    Data Review:    CBC  Recent Labs Lab 12/25/16 1905 12/25/16 2009  WBC 5.9  --   HGB 10.6* 12.9*  HCT 32.5* 38.0*  PLT 398  --   MCV 85.8  --   MCH 28.0  --   MCHC 32.6  --   RDW 14.5  --   LYMPHSABS 0.9  --   MONOABS 0.3  --   EOSABS 0.1  --   BASOSABS 0.0  --    ------------------------------------------------------------------------------------------------------------------  Chemistries   Recent Labs Lab 12/25/16 1905 12/25/16 2009  NA 136 138  K 4.4 4.8  CL 106 107  CO2 20*  --   GLUCOSE 225* 204*  BUN 62* 56*  CREATININE 3.60* 3.50*  CALCIUM 8.7*  --   AST 20  --   ALT 18  --   ALKPHOS 66  --   BILITOT 0.4  --    ------------------------------------------------------------------------------------------------------------------  ------------------------------------------------------------------------------------------------------------------ GFR: Estimated Creatinine Clearance: 13.8 mL/min (A) (by C-G formula based on SCr of 3.5 mg/dL (H)). Liver Function Tests:  Recent Labs Lab 12/25/16 1905  AST 20  ALT 18  ALKPHOS 66  BILITOT 0.4  PROT 8.1  ALBUMIN 3.5   No results for input(s): LIPASE, AMYLASE in the last 168 hours. No results for input(s): AMMONIA in the last 168 hours. Coagulation Profile:  Recent Labs Lab 12/25/16 1905  INR 1.16   Cardiac Enzymes: No results for input(s): CKTOTAL, CKMB, CKMBINDEX, TROPONINI in the last 168 hours. BNP (last 3 results) No results for input(s): PROBNP in the last 8760 hours. HbA1C: No results for input(s): HGBA1C in the last 72 hours. CBG:  Recent Labs Lab 12/25/16 1925  GLUCAP 189*   Lipid Profile: No results for input(s): CHOL, HDL, LDLCALC,  TRIG, CHOLHDL, LDLDIRECT in the last 72 hours. Thyroid Function Tests: No results for input(s): TSH, T4TOTAL, FREET4, T3FREE, THYROIDAB in the last 72 hours. Anemia Panel: No results for input(s): VITAMINB12, FOLATE, FERRITIN, TIBC, IRON, RETICCTPCT in the last 72 hours.  --------------------------------------------------------------------------------------------------------------- Urine analysis:    Component Value Date/Time   COLORURINE YELLOW 12/25/2016  2003   APPEARANCEUR HAZY (A) 12/25/2016 2003   LABSPEC 1.015 12/25/2016 2003   PHURINE 5.0 12/25/2016 2003   GLUCOSEU NEGATIVE 12/25/2016 2003   HGBUR NEGATIVE 12/25/2016 2003   BILIRUBINUR NEGATIVE 12/25/2016 2003   KETONESUR NEGATIVE 12/25/2016 2003   PROTEINUR 30 (A) 12/25/2016 2003   UROBILINOGEN 0.2 10/11/2006 1026   NITRITE NEGATIVE 12/25/2016 2003   LEUKOCYTESUR MODERATE (A) 12/25/2016 2003      Imaging Results:    Ct Chest Wo Contrast  Result Date: 12/25/2016 CLINICAL DATA:  Altered mental status. Interstitial lung disease. Increased weakness to the left side and now has difficulty moving the left side. EXAM: CT CHEST WITHOUT CONTRAST TECHNIQUE: Multidetector CT imaging of the chest was performed following the standard protocol without IV contrast. COMPARISON:  07/08/2004 FINDINGS: Cardiovascular: Normal heart size. No pericardial effusion. Coronary artery and aortic calcifications. Diffusely ectatic thoracic aorta. Ascending aorta measures 3.6 cm diameter. Descending aorta measures 3.2 cm diameter. Mediastinum/Nodes: Esophagus is decompressed. Scattered mediastinal lymph nodes are not pathologically enlarged. No abnormal mediastinal fluid collection or gas. Lungs/Pleura: There is chronic left apical pleural thickening, scarring, and volume loss in the left upper lung with scarring and honeycomb changes. This appearance is similar to the previous study without significant progression. Scarring and emphysematous changes also in  the right upper lung as before. No definite evidence of any developing mass or acute consolidation. No pleural effusions. No pneumothorax. Diffuse emphysematous changes throughout the lungs. Upper Abdomen: Configuration of the liver suggest possible hepatic cirrhosis. Diffuse calcification throughout the pancreas consistent with chronic pancreatitis. No acute process suggested in the visualized upper abdomen. Musculoskeletal: Degenerative changes in the spine. No destructive bone lesions. IMPRESSION: 1. Diffuse emphysematous change throughout the lungs. Chronic bilateral apical scarring, chronic pleural thickening and volume loss in the left upper lung with scarring and honeycomb changes. No significant change since 2006. No evidence of acute consolidation. 2. A ectatic thoracic aorta. Aortic and coronary artery calcifications. 3. Probable changes of hepatic cirrhosis. Pancreatic calcification consistent with chronic pancreatitis. Aortic Atherosclerosis (ICD10-I70.0) and Emphysema (ICD10-J43.9). Electronically Signed   By: Burman Nieves M.D.   On: 12/25/2016 21:09   Dg Chest Portable 1 View  Result Date: 12/25/2016 CLINICAL DATA:  Cough EXAM: PORTABLE CHEST 1 VIEW COMPARISON:  07/13/2016 FINDINGS: Development of multifocal opacities within the lung apices and left base. No pleural effusion. Mild cardiomegaly. Stable slightly enlarged cardiomediastinal silhouette with tortuosity of the aortic arch. No pneumothorax. Possible left hilar density IMPRESSION: New multifocal opacities within the apical lungs and left base which may reflect multifocal pneumonia. Possible left hilar opacity, CT chest suggested for further evaluation. Electronically Signed   By: Jasmine Pang M.D.   On: 12/25/2016 20:07   Ct Head Code Stroke Wo Contrast  Result Date: 12/25/2016 CLINICAL DATA:  Code stroke. Initial evaluation for increase left-sided weakness for 4 hours. EXAM: CT HEAD WITHOUT CONTRAST TECHNIQUE: Contiguous axial  images were obtained from the base of the skull through the vertex without intravenous contrast. COMPARISON:  Prior CT from 07/13/2016. FINDINGS: Brain: Advanced age-related cerebral atrophy with chronic microvascular ischemic disease. Encephalomalacia within the anterior left frontal lobe compatible with remote left MCA territory infarct. Multiple remote lacunar infarcts present within the bilateral basal ganglia and thalami as well as the brainstem. Few small remote right cerebellar infarcts. There is question of a tiny approximate 1 cm hypodensity involving the cortical gray matter of the posterior left frontal lobe, precentral gyrus/ motor strip, which may reflect an evolving tiny  acute ischemic infarct (series 2, image 29). Finding not entirely certain given the small size. No other definite acute large vessel territory infarct. No acute intracranial hemorrhage. No mass lesion or midline shift. No mass effect. No hydrocephalus. No extra-axial fluid collection. Vascular: No worrisome hyperdense vessel. Prominent vascular calcifications noted within the carotid siphons and distal vertebral artery's. Skull: Scalp soft tissues and calvarium within normal limits. Sinuses/Orbits: Globes and orbital soft tissues within normal limits. Paranasal sinuses are clear. Small right mastoid effusion noted. Other: None. ASPECTS [facility] Stroke Program Early CT Score) - Ganglionic level infarction (caudate, lentiform nuclei, internal capsule, insula, M1-M3 cortex): 7 - Supraganglionic infarction (M4-M6 cortex): 3 Total score (0-10 with 10 being normal): 10 IMPRESSION: 1. Question tiny approximate 1 cm evolving hypodensity at the posterior right frontal lobe, precentral gyrus, which may reflect a tiny acute cortical ischemic infarct. Finding could be further assessed with dedicated MRI as desired. No acute intracranial hemorrhage. 2. ASPECTS is 10 3. Otherwise stable atrophy and chronic microvascular ischemic disease with  multiple remote infarcts as above. Critical Value/emergent results were called by telephone at the time of interpretation on 12/25/2016 at 7:24 pm to Dr. Bethann Berkshire , who verbally acknowledged these results. Electronically Signed   By: Rise Mu M.D.   On: 12/25/2016 19:33    My personal review of EKG: Rhythm ? Atrial fibrillation   Assessment & Plan:    Active Problems:   Stroke (cerebrum) (HCC)   1. Stroke- CT head showed questionable stroke. Will admit the patient to hospital. Will transfer to [facility] as no neurology service available over the weekend, also he will need MRI of the brain. Will obtain echocardiogram, carotid Dopplers, check hemoglobin A1c, lipid profile in a.m. 2. UTI- patient has abnormal UA with too numerous to count WBC. Started on IV Levaquin. Follow urine culture results. 3. Acute on chronic kidney disease stage IV- patient's baseline creatinine is around 3.12, today presented with creatinine of 3.60. We'll start gentle IV hydration with normal saline. Follow BMP in a.m. 4. Hypertension-blood pressure stable, hold amlodipine for permissive hypertension. Will treat for BP greater than 220/120. 5. Diabetes mellitus- initiate sliding scale insulin with NovoLog. Hold oral hypoglycemic agents. 6. Atrial fibrillation- EKG shows irregular rhythm, will monitor on telemetry. CHA2DS2VASc score of at least 6. Patient will definitely benefit from anti-coagulation. Will await neurology recommendations.  Discussed with Dr. Clyde Lundborg, he has accepted the patient in transfer to [facility].   DVT Prophylaxis-   Lovenox   AM Labs Ordered, also please review Full Orders  Family Communication: Admission, patients condition and plan of care including tests being ordered have been discussed with the patient and his family at bedside who indicate understanding and agree with the plan and Code Status.  Code Status:  Full code  Admission status: Inpatient    Time spent  in minutes : 60 minutes   Chyna Kneece SM.D on 12/25/2016 at 9:50 PM  Between 7am to 7pm - Pager - [phone]. After 7pm go to [email or web address] - password [facility]  [facility] - Office  [phone]

## 2016-12-25 NOTE — ED Notes (Signed)
Report given to [facility] for Transport to [facility] transport.  ETA 30 min.

## 2016-12-25 NOTE — Progress Notes (Signed)
Er called and brought patient 1904 Exam started 1905 exam finished 1906 Images sent to soc 1906 [facility] radiology called and exam completed @ 1908.

## 2016-12-25 NOTE — ED Triage Notes (Signed)
Pt caregiver at care home, pt has a history of L sided weakness from an old head injury This afternoon about 1500-1600 pt became more weak to left side and now has difficulty moving L side at all  He is brought to the back with stop outside of Dr Estell Harpin office then to room 2 Eval by Dr Estell Harpin and   To CT

## 2016-12-25 NOTE — ED Notes (Signed)
teleneuro complete

## 2016-12-25 NOTE — ED Provider Notes (Signed)
[facility] DEPT Provider Note   CSN: [id number] Arrival date & time: 12/25/16  1857   An emergency department physician performed an initial assessment on this suspected stroke patient at 1900.  History   Chief Complaint Chief Complaint  Patient presents with  . Code Stroke    HPI Jerry Lane is a 83 y.o. male.  Patient has a history of diabetes hypertension previous stroke that left his left side week. He was noticed to be even more weak on the left side around 3:30 today. He was unable to ambulate due to his weakness in his left leg   The history is provided by a caregiver. No language interpreter was used.  Weakness  Primary symptoms include focal weakness. This is a new problem. The current episode started 3 to 5 hours ago. The problem has not changed since onset.There was left upper extremity and left lower extremity focality noted. There has been no fever. Pertinent negatives include no shortness of breath. There were no medications administered prior to arrival. Associated medical issues do not include trauma.    Past Medical History:  Diagnosis Date  . Arthritis   . Diabetes mellitus    Type II  . Hypertension   . Stroke [facility])    no residual weakness    Patient Active Problem List   Diagnosis Date Noted  . Anemia 01/09/2016  . CKD (chronic kidney disease) stage 3, GFR 30-59 ml/min 01/09/2016  . Hyperkalemia 10/20/2010  . Renal failure 10/20/2010  . DM (diabetes mellitus), type 2 with renal complications (HCC) 10/20/2010  . HTN (hypertension) 10/20/2010  . Cerebrovascular disease 10/20/2010    Past Surgical History:  Procedure Laterality Date  . TIBIA FRACTURE SURGERY  1987   Right, [facility]?       Home Medications    Prior to Admission medications   Medication Sig Start Date End Date Taking? Authorizing Provider  amLODipine (NORVASC) 2.5 MG tablet Take 2.5 mg by mouth daily.     Yes [provider]  aspirin 81 MG tablet Take 81 mg by  mouth daily. Aspirin 81 mg daily    Yes [provider]  tamsulosin (FLOMAX) 0.4 MG CAPS capsule Take 0.4 mg by mouth daily.   Yes [provider]  TRADJENTA 5 MG TABS tablet Take 5 mg by mouth daily.  06/02/16  Yes [provider]    Family History Family History  Problem Relation Age of Onset  . Anesthesia problems Neg Hx   . Hypotension Neg Hx   . Malignant hyperthermia Neg Hx   . Pseudochol deficiency Neg Hx     Social History Social History  Substance Use Topics  . Smoking status: Former Smoker    Years: 60.00    Types: Cigarettes    Quit date: 10/13/2005  . Smokeless tobacco: Never Used  . Alcohol use No     Allergies   Patient has no known allergies.   Review of Systems Review of Systems  Unable to perform ROS: Dementia  Respiratory: Negative for shortness of breath.   Neurological: Positive for focal weakness and weakness.     Physical Exam Updated Vital Signs BP (!) 153/55 (BP Location: Left Arm)   Pulse 66   Temp 98.7 F (37.1 C) (Oral)   Resp 16   Ht  (1.727 m)   Wt 65.8 kg (145 lb)   SpO2 99%   BMI 22.05 kg/m   Physical Exam  Constitutional: He is oriented to person, place,  and time. He appears well-developed.  HENT:  Head: Normocephalic.  Eyes: Conjunctivae and EOM are normal. No scleral icterus.  Neck: Neck supple. No thyromegaly present.  Cardiovascular: Normal rate and regular rhythm.  Exam reveals no gallop and no friction rub.   No murmur heard. Pulmonary/Chest: No stridor. He has no wheezes. He has no rales. He exhibits no tenderness.  Abdominal: He exhibits no distension. There is no tenderness. There is no rebound.  Musculoskeletal: Normal range of motion. He exhibits no edema.  Patient has profound weakness in left arm and left leg. He can barely move his left arm off the table and he cannot lift his left leg off the bed  Lymphadenopathy:    He has no cervical adenopathy.  Neurological: He is oriented  to person, place, and time. He exhibits normal muscle tone. Coordination normal.  Skin: No rash noted. No erythema.  Psychiatric: He has a normal mood and affect. His behavior is normal.     ED Treatments / Results  Labs (all labs ordered are listed, but only abnormal results are displayed) Labs Reviewed  CBC - Abnormal; Notable for the following:       Result Value   RBC 3.79 (*)    Hemoglobin 10.6 (*)    HCT 32.5 (*)    All other components within normal limits  COMPREHENSIVE METABOLIC PANEL - Abnormal; Notable for the following:    CO2 20 (*)    Glucose, Bld 225 (*)    BUN 62 (*)    Creatinine, Ser 3.60 (*)    Calcium 8.7 (*)    GFR calc non Af Amer 14 (*)    GFR calc Af Amer 16 (*)    All other components within normal limits  URINALYSIS, ROUTINE W REFLEX MICROSCOPIC - Abnormal; Notable for the following:    APPearance HAZY (*)    Protein, ur 30 (*)    Leukocytes, UA MODERATE (*)    Bacteria, UA RARE (*)    Squamous Epithelial / LPF 0-5 (*)    All other components within normal limits  I-STAT CHEM 8, ED - Abnormal; Notable for the following:    BUN 56 (*)    Creatinine, Ser 3.50 (*)    Glucose, Bld 204 (*)    Calcium, Ion 1.13 (*)    TCO2 21 (*)    Hemoglobin 12.9 (*)    HCT 38.0 (*)    All other components within normal limits  CBG MONITORING, ED - Abnormal; Notable for the following:    Glucose-Capillary 189 (*)    All other components within normal limits  URINE CULTURE  ETHANOL  PROTIME-INR  APTT  DIFFERENTIAL  RAPID URINE DRUG SCREEN, HOSP PERFORMED  I-STAT TROPONIN, ED    EKG  EKG Interpretation  Date/Time:  Friday December 25 2016 19:25:52 EDT Ventricular Rate:  53 PR Interval:    QRS Duration: 136 QT Interval:  469 QTC Calculation: 441 R Axis:   -19 Text Interpretation:  Sinus rhythm Atrial premature complexes Right bundle branch block Anteroseptal infarct, age indeterminate ST elevation, consider inferior injury Lateral leads are also  involved Confirmed by Bethann Berkshire [phone]) on 12/25/2016 8:59:38 PM       Radiology Dg Chest Portable 1 View  Result Date: 12/25/2016 CLINICAL DATA:  Cough EXAM: PORTABLE CHEST 1 VIEW COMPARISON:  07/13/2016 FINDINGS: Development of multifocal opacities within the lung apices and left base. No pleural effusion. Mild cardiomegaly. Stable slightly enlarged cardiomediastinal silhouette with tortuosity of the  aortic arch. No pneumothorax. Possible left hilar density IMPRESSION: New multifocal opacities within the apical lungs and left base which may reflect multifocal pneumonia. Possible left hilar opacity, CT chest suggested for further evaluation. Electronically Signed   By: Jasmine Pang M.D.   On: 12/25/2016 20:07   Ct Head Code Stroke Wo Contrast  Result Date: 12/25/2016 CLINICAL DATA:  Code stroke. Initial evaluation for increase left-sided weakness for 4 hours. EXAM: CT HEAD WITHOUT CONTRAST TECHNIQUE: Contiguous axial images were obtained from the base of the skull through the vertex without intravenous contrast. COMPARISON:  Prior CT from 07/13/2016. FINDINGS: Brain: Advanced age-related cerebral atrophy with chronic microvascular ischemic disease. Encephalomalacia within the anterior left frontal lobe compatible with remote left MCA territory infarct. Multiple remote lacunar infarcts present within the bilateral basal ganglia and thalami as well as the brainstem. Few small remote right cerebellar infarcts. There is question of a tiny approximate 1 cm hypodensity involving the cortical gray matter of the posterior left frontal lobe, precentral gyrus/ motor strip, which may reflect an evolving tiny acute ischemic infarct (series 2, image 29). Finding not entirely certain given the small size. No other definite acute large vessel territory infarct. No acute intracranial hemorrhage. No mass lesion or midline shift. No mass effect. No hydrocephalus. No extra-axial fluid collection. Vascular: No  worrisome hyperdense vessel. Prominent vascular calcifications noted within the carotid siphons and distal vertebral artery's. Skull: Scalp soft tissues and calvarium within normal limits. Sinuses/Orbits: Globes and orbital soft tissues within normal limits. Paranasal sinuses are clear. Small right mastoid effusion noted. Other: None. ASPECTS [facility] Stroke Program Early CT Score) - Ganglionic level infarction (caudate, lentiform nuclei, internal capsule, insula, M1-M3 cortex): 7 - Supraganglionic infarction (M4-M6 cortex): 3 Total score (0-10 with 10 being normal): 10 IMPRESSION: 1. Question tiny approximate 1 cm evolving hypodensity at the posterior right frontal lobe, precentral gyrus, which may reflect a tiny acute cortical ischemic infarct. Finding could be further assessed with dedicated MRI as desired. No acute intracranial hemorrhage. 2. ASPECTS is 10 3. Otherwise stable atrophy and chronic microvascular ischemic disease with multiple remote infarcts as above. Critical Value/emergent results were called by telephone at the time of interpretation on 12/25/2016 at 7:24 pm to Dr. Bethann Berkshire , who verbally acknowledged these results. Electronically Signed   By: Rise Mu M.D.   On: 12/25/2016 19:33    Procedures Procedures (including critical care time)  Medications Ordered in ED Medications  levofloxacin (LEVAQUIN) IVPB 500 mg (not administered)     Initial Impression / Assessment and Plan / ED Course  I have reviewed the triage vital signs and the nursing notes.  Pertinent labs & imaging results that were available during my care of the patient were reviewed by me and considered in my medical decision making (see chart for details).    CRITICAL CARE Performed by: Joevon Holliman L Total critical care time:35 minutes Critical care time was exclusive of separately billable procedures and treating other patients. Critical care was necessary to treat or prevent imminent or  life-threatening deterioration. Critical care was time spent personally by me on the following activities: development of treatment plan with patient and/or surrogate as well as nursing, discussions with consultants, evaluation of patient's response to treatment, examination of patient, obtaining history from patient or surrogate, ordering and performing treatments and interventions, ordering and review of laboratory studies, ordering and review of radiographic studies, pulse oximetry and re-evaluation of patient's condition.  Patient with a new stroke. I spoke with the  telemetry neurologist and it was decided the patient should not get thrombolytics. He will be admitted for acute stroke Final Clinical Impressions(s) / ED Diagnoses   Final diagnoses:  Left-sided weakness    New Prescriptions New Prescriptions   No medications on file     Bethann Berkshire, MD 12/25/16 2103

## 2016-12-26 ENCOUNTER — Inpatient Hospital Stay (HOSPITAL_COMMUNITY): Payer: Medicare Other

## 2016-12-26 DIAGNOSIS — Z7984 Long term (current) use of oral hypoglycemic drugs: Secondary | ICD-10-CM | POA: Diagnosis not present

## 2016-12-26 DIAGNOSIS — D638 Anemia in other chronic diseases classified elsewhere: Secondary | ICD-10-CM | POA: Diagnosis not present

## 2016-12-26 DIAGNOSIS — E43 Unspecified severe protein-calorie malnutrition: Secondary | ICD-10-CM | POA: Diagnosis not present

## 2016-12-26 DIAGNOSIS — N179 Acute kidney failure, unspecified: Secondary | ICD-10-CM | POA: Diagnosis not present

## 2016-12-26 DIAGNOSIS — L89152 Pressure ulcer of sacral region, stage 2: Secondary | ICD-10-CM | POA: Diagnosis not present

## 2016-12-26 DIAGNOSIS — Z6822 Body mass index (BMI) 22.0-22.9, adult: Secondary | ICD-10-CM | POA: Diagnosis not present

## 2016-12-26 DIAGNOSIS — N390 Urinary tract infection, site not specified: Secondary | ICD-10-CM | POA: Diagnosis not present

## 2016-12-26 DIAGNOSIS — I63541 Cerebral infarction due to unspecified occlusion or stenosis of right cerebellar artery: Secondary | ICD-10-CM | POA: Diagnosis not present

## 2016-12-26 DIAGNOSIS — E118 Type 2 diabetes mellitus with unspecified complications: Secondary | ICD-10-CM

## 2016-12-26 DIAGNOSIS — R29707 NIHSS score 7: Secondary | ICD-10-CM | POA: Diagnosis not present

## 2016-12-26 DIAGNOSIS — G9341 Metabolic encephalopathy: Secondary | ICD-10-CM | POA: Diagnosis not present

## 2016-12-26 DIAGNOSIS — R52 Pain, unspecified: Secondary | ICD-10-CM

## 2016-12-26 DIAGNOSIS — Z7982 Long term (current) use of aspirin: Secondary | ICD-10-CM | POA: Diagnosis not present

## 2016-12-26 DIAGNOSIS — I129 Hypertensive chronic kidney disease with stage 1 through stage 4 chronic kidney disease, or unspecified chronic kidney disease: Secondary | ICD-10-CM | POA: Diagnosis not present

## 2016-12-26 DIAGNOSIS — I639 Cerebral infarction, unspecified: Secondary | ICD-10-CM

## 2016-12-26 DIAGNOSIS — I69354 Hemiplegia and hemiparesis following cerebral infarction affecting left non-dominant side: Secondary | ICD-10-CM | POA: Diagnosis not present

## 2016-12-26 DIAGNOSIS — E785 Hyperlipidemia, unspecified: Secondary | ICD-10-CM | POA: Diagnosis not present

## 2016-12-26 DIAGNOSIS — N184 Chronic kidney disease, stage 4 (severe): Secondary | ICD-10-CM

## 2016-12-26 DIAGNOSIS — E875 Hyperkalemia: Secondary | ICD-10-CM | POA: Diagnosis not present

## 2016-12-26 DIAGNOSIS — E1122 Type 2 diabetes mellitus with diabetic chronic kidney disease: Secondary | ICD-10-CM | POA: Diagnosis not present

## 2016-12-26 DIAGNOSIS — R4781 Slurred speech: Secondary | ICD-10-CM | POA: Diagnosis not present

## 2016-12-26 DIAGNOSIS — I4891 Unspecified atrial fibrillation: Secondary | ICD-10-CM | POA: Diagnosis not present

## 2016-12-26 DIAGNOSIS — B9620 Unspecified Escherichia coli [E. coli] as the cause of diseases classified elsewhere: Secondary | ICD-10-CM | POA: Diagnosis not present

## 2016-12-26 DIAGNOSIS — Z87891 Personal history of nicotine dependence: Secondary | ICD-10-CM | POA: Diagnosis not present

## 2016-12-26 LAB — CBC
HEMATOCRIT: 34.1 % — AB (ref 39.0–52.0)
HEMOGLOBIN: 10.9 g/dL — AB (ref 13.0–17.0)
MCH: 27.4 pg (ref 26.0–34.0)
MCHC: 32 g/dL (ref 30.0–36.0)
MCV: 85.7 fL (ref 78.0–100.0)
Platelets: 409 10*3/uL — ABNORMAL HIGH (ref 150–400)
RBC: 3.98 MIL/uL — AB (ref 4.22–5.81)
RDW: 14.5 % (ref 11.5–15.5)
WBC: 6 10*3/uL (ref 4.0–10.5)

## 2016-12-26 LAB — BASIC METABOLIC PANEL
Anion gap: 12 (ref 5–15)
BUN: 48 mg/dL — ABNORMAL HIGH (ref 6–20)
CHLORIDE: 110 mmol/L (ref 101–111)
CO2: 14 mmol/L — ABNORMAL LOW (ref 22–32)
CREATININE: 3.1 mg/dL — AB (ref 0.61–1.24)
Calcium: 9 mg/dL (ref 8.9–10.3)
GFR, EST AFRICAN AMERICAN: 19 mL/min — AB (ref >60)
GFR, EST NON AFRICAN AMERICAN: 17 mL/min — AB (ref >60)
Glucose, Bld: 97 mg/dL (ref 65–99)
POTASSIUM: 5.6 mmol/L — AB (ref 3.5–5.1)
SODIUM: 136 mmol/L (ref 135–145)

## 2016-12-26 LAB — LIPID PANEL
Cholesterol: 146 mg/dL (ref 0–200)
HDL: 30 mg/dL — ABNORMAL LOW (ref >40)
LDL CALC: 102 mg/dL — AB (ref 0–99)
Total CHOL/HDL Ratio: 4.9 RATIO
Triglycerides: 70 mg/dL (ref <150)
VLDL: 14 mg/dL (ref 0–40)

## 2016-12-26 LAB — CREATININE, SERUM
Creatinine, Ser: 3.22 mg/dL — ABNORMAL HIGH (ref 0.61–1.24)
GFR calc non Af Amer: 16 mL/min — ABNORMAL LOW (ref >60)
GFR, EST AFRICAN AMERICAN: 18 mL/min — AB (ref >60)

## 2016-12-26 LAB — GLUCOSE, CAPILLARY
GLUCOSE-CAPILLARY: 91 mg/dL (ref 65–99)
Glucose-Capillary: 131 mg/dL — ABNORMAL HIGH (ref 65–99)
Glucose-Capillary: 72 mg/dL (ref 65–99)
Glucose-Capillary: 96 mg/dL (ref 65–99)

## 2016-12-26 LAB — HEMOGLOBIN A1C
HEMOGLOBIN A1C: 7.1 % — AB (ref 4.8–5.6)
MEAN PLASMA GLUCOSE: 157.07 mg/dL

## 2016-12-26 LAB — MAGNESIUM: MAGNESIUM: 2 mg/dL (ref 1.7–2.4)

## 2016-12-26 MED ORDER — LEVOFLOXACIN IN D5W 500 MG/100ML IV SOLN: 500.0000 mg | INTRAVENOUS | Status: DC

## 2016-12-26 MED ORDER — ATORVASTATIN CALCIUM 40 MG PO TABS
40.0000 mg | ORAL_TABLET | Freq: Every day | ORAL | Status: DC
  Administered 2016-12-26 – 2016-12-29 (×4): 40 mg via ORAL
  Filled 2016-12-26 (×4): qty 1

## 2016-12-26 MED ORDER — SODIUM CHLORIDE 0.9 % IV SOLN
INTRAVENOUS | Status: DC
  Administered 2016-12-26 – 2016-12-28 (×4): via INTRAVENOUS
  Administered 2016-12-29: 1000 mL via INTRAVENOUS

## 2016-12-26 MED ORDER — ASPIRIN 300 MG RE SUPP: 300.0000 mg | Freq: Every day | RECTAL | Status: DC

## 2016-12-26 MED ORDER — STROKE: EARLY STAGES OF RECOVERY BOOK
Freq: Once | Status: AC
  Administered 2016-12-26: 06:00:00
  Filled 2016-12-26: qty 1

## 2016-12-26 MED ORDER — SENNOSIDES-DOCUSATE SODIUM 8.6-50 MG PO TABS: 1.0000 | ORAL_TABLET | Freq: Every evening | ORAL | Status: DC | PRN

## 2016-12-26 MED ORDER — ENOXAPARIN SODIUM 30 MG/0.3ML ~~LOC~~ SOLN
30.0000 mg | SUBCUTANEOUS | Status: DC
  Administered 2016-12-26 – 2016-12-30 (×5): 30 mg via SUBCUTANEOUS
  Filled 2016-12-26 (×5): qty 0.3

## 2016-12-26 MED ORDER — ASPIRIN 325 MG PO TABS
325.0000 mg | ORAL_TABLET | Freq: Every day | ORAL | Status: DC
  Administered 2016-12-26 – 2016-12-30 (×5): 325 mg via ORAL
  Filled 2016-12-26 (×5): qty 1

## 2016-12-26 MED ORDER — INSULIN ASPART 100 UNIT/ML ~~LOC~~ SOLN
0.0000 [IU] | Freq: Three times a day (TID) | SUBCUTANEOUS | Status: DC
  Administered 2016-12-26 – 2016-12-29 (×4): 2 [IU] via SUBCUTANEOUS
  Administered 2016-12-30: 1 [IU] via SUBCUTANEOUS

## 2016-12-26 MED ORDER — SODIUM CHLORIDE 0.9 % IV SOLN: INTRAVENOUS | Status: DC

## 2016-12-26 MED ORDER — CLOPIDOGREL BISULFATE 75 MG PO TABS
75.0000 mg | ORAL_TABLET | Freq: Every day | ORAL | Status: DC
  Administered 2016-12-26 – 2016-12-30 (×5): 75 mg via ORAL
  Filled 2016-12-26 (×5): qty 1

## 2016-12-26 NOTE — Progress Notes (Signed)
*  PRELIMINARY RESULTS* Vascular Ultrasound Carotid Duplex (Doppler) has been completed.  Preliminary findings: Right 60-79% ICA stenosis. Left 1-39% ICA stenosis.  Technically difficult study due to patient movement and stiff neck.   Farrel Demark, RDMS, RVT  12/26/2016, 11:33 AM

## 2016-12-26 NOTE — Progress Notes (Signed)
PROGRESS NOTE    NIELS CRANSHAW  ZOX:[id number] DOB: 11-30-1927 DOA: 12/25/2016 PCP: Mirna Mires, MD   Chief Complaint  Patient presents with  . Code Stroke    Brief Narrative:  HPI on 12/25/2016 by Dr. Mauro Kaufmann Roi Jafari  is a 83 y.o. male, With history of diabetes, hypertension, stroke with residual weakness in left arm who came to hospital after patient was found to be weak on left side around 3:30 PM. Patient was unable to ambulate due to weakness in his left leg. He denies slurred speech. No passing out. No seizure-like activity. Patient was seen in the ED and code stroke was called, patient was evaluated by tele neurology, and no TPA was given. CT head showed Question tiny approximate 1 cm evolving hypodensity at the posterior right frontal lobe, precentral gyrus, which may reflect a tiny acute cortical ischemic infarct. MRI was recommended. Patient denies chest pain, no shortness of breath. Denies dysuria. No abdominal pain. No nausea vomiting or diarrhea. Patient also had chest x-ray which showed questionable pneumonia, CT chest was performed which did not show pneumonia. He had abnormal UA with too numerous to count WBCs. Started on IV Levaquin. Blood and urine cultures have been obtained. Assessment & Plan   Acute CVA -CT head: Question tiny approximate 1 cm evolving hypodensity at the posterior right frontal lobe, precentral gyrus, may reflect tiny acute cortical ischemic infarct -MRI brain: Acute patchy right MCA territory and right posterior watershed territory infarcts. Old small left frontal lobe/MCH are probably infarct. -MRA head: No emergent large vessel occlusion or flow-limiting stenosis. -Carotid doppler: Right 60-79% ICA stenosis. Left 1-39% ICA stenosis. -Echocardigoram pending -LDL 102, Hemoglobin A1c 7.1 -Neurology consulted and appreciated -Continue aspirin -Not sure why patient is not on statin from his prior strokes, will start on atorvastatin 40  mg  Abnormal UA -UA showed rare bacteria, TNTC WBC, moderate leukocytes, negative nitrites -It is questionable if patient is having urinary symptoms. -Currently afebrile and no leukocytosis -Will discontinue Levaquin and wait the results of the urine culture  Acute kidney injury on chronic kidney disease stage IV -Baseline creatinine approximately 2.7-3.2 -Creatinine on admission 3.6, currently 3.22 -Continue to monitor BMP  Essential hypertension -Hold amlodipine, allow permissive hypertension  Diabetes mellitus, type II -Patient uses Tradjenta at home, will hold -Continue insulin sliding scale CBG monitoring -Hemoglobin A1c 7.1  DVT Prophylaxis  Lovenox  Code Status: Full  Family Communication: None at bedside  Disposition Plan: Admitted, pending further workup  Consultants Neurology  Procedures  Carotid Doppler  Antibiotics   Anti-infectives    Start     Dose/Rate Route Frequency Ordered Stop   12/27/16 2300  levofloxacin (LEVAQUIN) IVPB 500 mg     500 mg 100 mL/hr over 60 Minutes Intravenous Every 48 hours 12/26/16 0118     12/25/16 2100  levofloxacin (LEVAQUIN) IVPB 500 mg     500 mg 100 mL/hr over 60 Minutes Intravenous  Once 12/25/16 2058 12/26/16 0026      Subjective:   Jarvin Ogren seen and examined today. Has no complaints today. Denies chest pain, shortness of breath, abdominal pain, N/V.  Objective:   Vitals:   12/26/16 0526 12/26/16 0600 12/26/16 0613 12/26/16 0849  BP: 100/67 (!) 124/52  (!) 151/68  Pulse:   (!) 37 65  Resp:      Temp:   98.2 F (36.8 C)   TempSrc:   Oral   SpO2:   96%   Weight:  Height:        Intake/Output Summary (Last 24 hours) at 12/26/16 1328 Last data filed at 12/26/16 0900  Gross per 24 hour  Intake                0 ml  Output                0 ml  Net                0 ml   Filed Weights   12/25/16 1925  Weight: 65.8 kg (145 lb)    Exam  General: Well developed, elderly, ill-appearing,  NAD  HEENT: NCAT, mucous membranes moist.   Cardiovascular: S1 S2 auscultated, irregular  Respiratory: Clear to auscultation bilaterally with equal chest rise  Abdomen: Soft, nontender, nondistended, + bowel sounds  Extremities: warm dry without cyanosis clubbing or edema  Neuro: AAOx3, LUE weakness, slurred speech   Data Reviewed: I have personally reviewed following labs and imaging studies  CBC:  Recent Labs Lab 12/25/16 1905 12/25/16 2009 12/26/16 0535  WBC 5.9  --  6.0  NEUTROABS 4.6  --   --   HGB 10.6* 12.9* 10.9*  HCT 32.5* 38.0* 34.1*  MCV 85.8  --  85.7  PLT 398  --  409*   Basic Metabolic Panel:  Recent Labs Lab 12/25/16 1905 12/25/16 2009 12/26/16 0535  NA 136 138  --   K 4.4 4.8  --   CL 106 107  --   CO2 20*  --   --   GLUCOSE 225* 204*  --   BUN 62* 56*  --   CREATININE 3.60* 3.50* 3.22*  CALCIUM 8.7*  --   --    GFR: Estimated Creatinine Clearance: 15 mL/min (A) (by C-G formula based on SCr of 3.22 mg/dL (H)). Liver Function Tests:  Recent Labs Lab 12/25/16 1905  AST 20  ALT 18  ALKPHOS 66  BILITOT 0.4  PROT 8.1  ALBUMIN 3.5   No results for input(s): LIPASE, AMYLASE in the last 168 hours. No results for input(s): AMMONIA in the last 168 hours. Coagulation Profile:  Recent Labs Lab 12/25/16 1905  INR 1.16   Cardiac Enzymes: No results for input(s): CKTOTAL, CKMB, CKMBINDEX, TROPONINI in the last 168 hours. BNP (last 3 results) No results for input(s): PROBNP in the last 8760 hours. HbA1C:  Recent Labs  12/26/16 0554  HGBA1C 7.1*   CBG:  Recent Labs Lab 12/25/16 1925 12/26/16 0807 12/26/16 1209  GLUCAP 189* 131* 72   Lipid Profile:  Recent Labs  12/26/16 0535  CHOL 146  HDL 30*  LDLCALC 102*  TRIG 70  CHOLHDL 4.9   Thyroid Function Tests: No results for input(s): TSH, T4TOTAL, FREET4, T3FREE, THYROIDAB in the last 72 hours. Anemia Panel: No results for input(s): VITAMINB12, FOLATE, FERRITIN, TIBC,  IRON, RETICCTPCT in the last 72 hours. Urine analysis:    Component Value Date/Time   COLORURINE YELLOW 12/25/2016 2003   APPEARANCEUR HAZY (A) 12/25/2016 2003   LABSPEC 1.015 12/25/2016 2003   PHURINE 5.0 12/25/2016 2003   GLUCOSEU NEGATIVE 12/25/2016 2003   HGBUR NEGATIVE 12/25/2016 2003   BILIRUBINUR NEGATIVE 12/25/2016 2003   KETONESUR NEGATIVE 12/25/2016 2003   PROTEINUR 30 (A) 12/25/2016 2003   UROBILINOGEN 0.2 10/11/2006 1026   NITRITE NEGATIVE 12/25/2016 2003   LEUKOCYTESUR MODERATE (A) 12/25/2016 2003   Sepsis Labs: (procalcitonin:4,lacticidven:4)  ) Recent Results (from the past 240 hour(s))  Culture, blood (Routine X 2) w  Reflex to ID Panel     Status: None (Preliminary result)   Collection Time: 12/25/16 10:35 PM  Result Value Ref Range Status   Specimen Description RIGHT ANTECUBITAL  Final   Special Requests   Final    BOTTLES DRAWN AEROBIC AND ANAEROBIC Blood Culture adequate volume   Culture NO GROWTH < 12 HOURS  Final   Report Status PENDING  Incomplete  Culture, blood (Routine X 2) w Reflex to ID Panel     Status: None (Preliminary result)   Collection Time: 12/25/16 10:49 PM  Result Value Ref Range Status   Specimen Description BLOOD LEFT HAND  Final   Special Requests   Final    BOTTLES DRAWN AEROBIC ONLY Blood Culture results may not be optimal due to an inadequate volume of blood received in culture bottles   Culture PENDING  Incomplete   Report Status PENDING  Incomplete      Radiology Studies: Ct Chest Wo Contrast  Result Date: 12/25/2016 CLINICAL DATA:  Altered mental status. Interstitial lung disease. Increased weakness to the left side and now has difficulty moving the left side. EXAM: CT CHEST WITHOUT CONTRAST TECHNIQUE: Multidetector CT imaging of the chest was performed following the standard protocol without IV contrast. COMPARISON:  07/08/2004 FINDINGS: Cardiovascular: Normal heart size. No pericardial effusion. Coronary artery and  aortic calcifications. Diffusely ectatic thoracic aorta. Ascending aorta measures 3.6 cm diameter. Descending aorta measures 3.2 cm diameter. Mediastinum/Nodes: Esophagus is decompressed. Scattered mediastinal lymph nodes are not pathologically enlarged. No abnormal mediastinal fluid collection or gas. Lungs/Pleura: There is chronic left apical pleural thickening, scarring, and volume loss in the left upper lung with scarring and honeycomb changes. This appearance is similar to the previous study without significant progression. Scarring and emphysematous changes also in the right upper lung as before. No definite evidence of any developing mass or acute consolidation. No pleural effusions. No pneumothorax. Diffuse emphysematous changes throughout the lungs. Upper Abdomen: Configuration of the liver suggest possible hepatic cirrhosis. Diffuse calcification throughout the pancreas consistent with chronic pancreatitis. No acute process suggested in the visualized upper abdomen. Musculoskeletal: Degenerative changes in the spine. No destructive bone lesions. IMPRESSION: 1. Diffuse emphysematous change throughout the lungs. Chronic bilateral apical scarring, chronic pleural thickening and volume loss in the left upper lung with scarring and honeycomb changes. No significant change since 2006. No evidence of acute consolidation. 2. A ectatic thoracic aorta. Aortic and coronary artery calcifications. 3. Probable changes of hepatic cirrhosis. Pancreatic calcification consistent with chronic pancreatitis. Aortic Atherosclerosis (ICD10-I70.0) and Emphysema (ICD10-J43.9). Electronically Signed   By: Burman Nieves M.D.   On: 12/25/2016 21:09   Mr Brain Wo Contrast  Result Date: 12/26/2016 CLINICAL DATA:  Ataxia, LEFT-sided weakness. Follow-up possible acute stroke. History of hypertension, diabetes. EXAM: MRI HEAD WITHOUT CONTRAST MRA HEAD WITHOUT CONTRAST TECHNIQUE: Multiplanar, multiecho pulse sequences of the brain  and surrounding structures were obtained without intravenous contrast. Angiographic images of the head were obtained using MRA technique without contrast. COMPARISON:  CT HEAD December 25, 2016 FINDINGS: MRI HEAD FINDINGS BRAIN: Patchy reduced diffusion in RIGHT frontal, RIGHT parietal, RIGHT occipital lobe laterally with low ADC values. Chronic microhemorrhage LEFT frontal lobe. Faint mineralization associated with old small pontine and cerebellar infarcts. Old bilateral basal ganglia and thalamus infarcts. Prominent basal ganglia and thalamus perivascular spaces associated with chronic hypertension. Patchy to confluent supratentorial white matter FLAIR T2 hyperintensities. No midline shift, mass effect or masses. Small area LEFT frontal encephalomalacia. VASCULAR: Normal major intracranial vascular flow  voids present at skull base. SKULL AND UPPER CERVICAL SPINE: No abnormal sellar expansion. No suspicious calvarial bone marrow signal. Craniocervical junction maintained. SINUSES/ORBITS: The mastoid air-cells and included paranasal sinuses are well-aerated. The included ocular globes and orbital contents are non-suspicious. OTHER: Patient is edentulous. MRA HEAD FINDINGS ANTERIOR CIRCULATION: Normal flow related enhancement of the included cervical, petrous, cavernous and supraclinoid internal carotid arteries. Patent anterior communicating artery, 2 mm medially directed LEFT A1-2 junction aneurysm. Mild stenosis RIGHT proximal M1 segment. Patent anterior and middle cerebral arteries, including distal segments. No large vessel occlusion, high-grade stenosis . POSTERIOR CIRCULATION: Codominant vertebral artery's, mild stenosis distal LEFT V4 segment. Basilar artery is patent, with normal flow related enhancement of the main branch vessels. Patent posterior cerebral arteries. Robust bilateral posterior communicating arteries present. Mild tandem stenoses bilateral posterior cerebral artery's. No large vessel  occlusion, high-grade stenosis,  aneurysm. ANATOMIC VARIANTS: None. Source images and MIP images were reviewed. IMPRESSION: MRI HEAD: 1. Acute patchy RIGHT MCA territory and RIGHT posterior watershed territory infarcts. 2. Old small LEFT frontal lobe/MCA territory infarct. 3. Multiple old small basal ganglia, thalamus, pontine and cerebellar infarcts. 4. Moderate to severe chronic small vessel ischemic disease. MRA HEAD: 1. No emergent large vessel occlusion or flow limiting stenosis. Given above MRI findings, recommend angiographic imaging of the neck to evaluate for proximal stenosis. 2. Mild intracranial atherosclerosis. 3. 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 Electronically Signed   By: Awilda Metro M.D.   On: 12/26/2016 05:08   Dg Chest Portable 1 View  Result Date: 12/25/2016 CLINICAL DATA:  Cough EXAM: PORTABLE CHEST 1 VIEW COMPARISON:  07/13/2016 FINDINGS: Development of multifocal opacities within the lung apices and left base. No pleural effusion. Mild cardiomegaly. Stable slightly enlarged cardiomediastinal silhouette with tortuosity of the aortic arch. No pneumothorax. Possible left hilar density IMPRESSION: New multifocal opacities within the apical lungs and left base which may reflect multifocal pneumonia. Possible left hilar opacity, CT chest suggested for further evaluation. Electronically Signed   By: Jasmine Pang M.D.   On: 12/25/2016 20:07   Mr Maxine Glenn Head/brain ZO Cm  Result Date: 12/26/2016 CLINICAL DATA:  Ataxia, LEFT-sided weakness. Follow-up possible acute stroke. History of hypertension, diabetes. EXAM: MRI HEAD WITHOUT CONTRAST MRA HEAD WITHOUT CONTRAST TECHNIQUE: Multiplanar, multiecho pulse sequences of the brain and surrounding structures were obtained without intravenous contrast. Angiographic images of the head were obtained using MRA technique without contrast. COMPARISON:  CT HEAD December 25, 2016 FINDINGS: MRI HEAD FINDINGS BRAIN: Patchy reduced diffusion in RIGHT  frontal, RIGHT parietal, RIGHT occipital lobe laterally with low ADC values. Chronic microhemorrhage LEFT frontal lobe. Faint mineralization associated with old small pontine and cerebellar infarcts. Old bilateral basal ganglia and thalamus infarcts. Prominent basal ganglia and thalamus perivascular spaces associated with chronic hypertension. Patchy to confluent supratentorial white matter FLAIR T2 hyperintensities. No midline shift, mass effect or masses. Small area LEFT frontal encephalomalacia. VASCULAR: Normal major intracranial vascular flow voids present at skull base. SKULL AND UPPER CERVICAL SPINE: No abnormal sellar expansion. No suspicious calvarial bone marrow signal. Craniocervical junction maintained. SINUSES/ORBITS: The mastoid air-cells and included paranasal sinuses are well-aerated. The included ocular globes and orbital contents are non-suspicious. OTHER: Patient is edentulous. MRA HEAD FINDINGS ANTERIOR CIRCULATION: Normal flow related enhancement of the included cervical, petrous, cavernous and supraclinoid internal carotid arteries. Patent anterior communicating artery, 2 mm medially directed LEFT A1-2 junction aneurysm. Mild stenosis RIGHT proximal M1 segment. Patent anterior and middle cerebral arteries, including distal segments. No large vessel  occlusion, high-grade stenosis . POSTERIOR CIRCULATION: Codominant vertebral artery's, mild stenosis distal LEFT V4 segment. Basilar artery is patent, with normal flow related enhancement of the main branch vessels. Patent posterior cerebral arteries. Robust bilateral posterior communicating arteries present. Mild tandem stenoses bilateral posterior cerebral artery's. No large vessel occlusion, high-grade stenosis,  aneurysm. ANATOMIC VARIANTS: None. Source images and MIP images were reviewed. IMPRESSION: MRI HEAD: 1. Acute patchy RIGHT MCA territory and RIGHT posterior watershed territory infarcts. 2. Old small LEFT frontal lobe/MCA territory  infarct. 3. Multiple old small basal ganglia, thalamus, pontine and cerebellar infarcts. 4. Moderate to severe chronic small vessel ischemic disease. MRA HEAD: 1. No emergent large vessel occlusion or flow limiting stenosis. Given above MRI findings, recommend angiographic imaging of the neck to evaluate for proximal stenosis. 2. Mild intracranial atherosclerosis. 3. 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 Electronically Signed   By: Awilda Metro M.D.   On: 12/26/2016 05:08   Ct Head Code Stroke Wo Contrast  Result Date: 12/25/2016 CLINICAL DATA:  Code stroke. Initial evaluation for increase left-sided weakness for 4 hours. EXAM: CT HEAD WITHOUT CONTRAST TECHNIQUE: Contiguous axial images were obtained from the base of the skull through the vertex without intravenous contrast. COMPARISON:  Prior CT from 07/13/2016. FINDINGS: Brain: Advanced age-related cerebral atrophy with chronic microvascular ischemic disease. Encephalomalacia within the anterior left frontal lobe compatible with remote left MCA territory infarct. Multiple remote lacunar infarcts present within the bilateral basal ganglia and thalami as well as the brainstem. Few small remote right cerebellar infarcts. There is question of a tiny approximate 1 cm hypodensity involving the cortical gray matter of the posterior left frontal lobe, precentral gyrus/ motor strip, which may reflect an evolving tiny acute ischemic infarct (series 2, image 29). Finding not entirely certain given the small size. No other definite acute large vessel territory infarct. No acute intracranial hemorrhage. No mass lesion or midline shift. No mass effect. No hydrocephalus. No extra-axial fluid collection. Vascular: No worrisome hyperdense vessel. Prominent vascular calcifications noted within the carotid siphons and distal vertebral artery's. Skull: Scalp soft tissues and calvarium within normal limits. Sinuses/Orbits: Globes and orbital soft tissues within normal limits.  Paranasal sinuses are clear. Small right mastoid effusion noted. Other: None. ASPECTS [facility] Stroke Program Early CT Score) - Ganglionic level infarction (caudate, lentiform nuclei, internal capsule, insula, M1-M3 cortex): 7 - Supraganglionic infarction (M4-M6 cortex): 3 Total score (0-10 with 10 being normal): 10 IMPRESSION: 1. Question tiny approximate 1 cm evolving hypodensity at the posterior right frontal lobe, precentral gyrus, which may reflect a tiny acute cortical ischemic infarct. Finding could be further assessed with dedicated MRI as desired. No acute intracranial hemorrhage. 2. ASPECTS is 10 3. Otherwise stable atrophy and chronic microvascular ischemic disease with multiple remote infarcts as above. Critical Value/emergent results were called by telephone at the time of interpretation on 12/25/2016 at 7:24 pm to Dr. Bethann Berkshire , who verbally acknowledged these results. Electronically Signed   By: Rise Mu M.D.   On: 12/25/2016 19:33     Scheduled Meds: . aspirin  300 mg Rectal Daily   Or  . aspirin  325 mg Oral Daily  . enoxaparin (LOVENOX) injection  30 mg Subcutaneous Q24H  . insulin aspart  0-9 Units Subcutaneous TID WC   Continuous Infusions: . sodium chloride 50 mL/hr at 12/26/16 0240  . [START ON 12/27/2016] levofloxacin (LEVAQUIN) IV       LOS: 1 day   Time Spent in minutes   30 minutes  Gloria Ricardo D.O. on 12/26/2016 at 1:28 PM  Between 7am to 7pm - Pager - [phone]  After 7pm go to [email or web address] - password [email or web address]  And look for the night coverage person covering for me after hours  [facility] Office  [phone]

## 2016-12-26 NOTE — Evaluation (Signed)
Occupational Therapy Evaluation Patient Details Name: Jerry Lane MRN: [id number] DOB: Apr 22, 1927 Today's Date: 12/26/2016    History of Present Illness Pt is an 83 y/o male admitted secondary to L sided weakness. Pt found to have an acute ischemic stroke. PMH including but not limited to DM, HTN, CKD and hx of CVA with residual L sided weakness.   Clinical Impression   Per RN, pt was received from SNF. Due to prior cognitive deficits, pt unable to provide reliable PLOF and home set up. Pt currently requiring Mod A for UB ADLs and Max A +2 for LB ADLs and toileting. Pt would benefit from further acute OT  To facilitate safe dc. Recommend dc to SNF for further OT to increase safety and occupational performance and participation.     Follow Up Recommendations  SNF;Supervision/Assistance - 24 hour    Equipment Recommendations   (Defer to next venue)    Recommendations for Other Services PT consult     Precautions / Restrictions Precautions Precautions: Fall Restrictions Weight Bearing Restrictions: No      Mobility Bed Mobility Overal bed mobility: Needs Assistance Bed Mobility: Supine to Sit     Supine to sit: Mod assist     General bed mobility comments: increased time and effort, pt assisting with R UE on bed rail, assist to elevate trunk and use of bed pads to position pt's hips at EOB  Transfers Overall transfer level: Needs assistance Equipment used: 2 person hand held assist Transfers: Sit to/from [employer] Sit to Stand: Mod assist;+2 safety/equipment Stand pivot transfers: Max assist;+2 physical assistance       General transfer comment: increased time and effort, cueing for technique, assist to power into standing from sitting EOB and heavy physical assist of two for pivotal movement to chair with physical assist for movement of L LE    Balance Overall balance assessment: Needs assistance Sitting-balance support: Feet supported Sitting  balance-Leahy Scale: Poor Sitting balance - Comments: reliant on R UE support   Standing balance support: During functional activity Standing balance-Leahy Scale: Zero Standing balance comment: max A for standing pericare                           ADL either performed or assessed with clinical judgement   ADL Overall ADL's : Needs assistance/impaired Eating/Feeding: Minimal assistance;Sitting;Cueing for sequencing   Grooming: Minimal assistance;Cueing for sequencing;Sitting   Upper Body Bathing: Moderate assistance;Sitting   Lower Body Bathing: Maximal assistance;Sit to/from stand Lower Body Bathing Details (indicate cue type and reason): Pt performed peri care after urination with Max A to maintain standing balance Upper Body Dressing : Moderate assistance;Sitting Upper Body Dressing Details (indicate cue type and reason): Pt lifting L hand with R hand to bring L arm through sleeve Lower Body Dressing: Maximal assistance;Sit to/from stand Lower Body Dressing Details (indicate cue type and reason): donned socks with Max A Toilet Transfer: Stand-pivot;Maximal assistance;+2 for physical assistance (Simualted to recliner)   Toileting- Clothing Manipulation and Hygiene: Maximal assistance;+2 for physical assistance;Sit to/from stand Toileting - Clothing Manipulation Details (indicate cue type and reason): Pt performed peri care with Max A +2 to maintain standing       General ADL Comments: Pt with decreased functional performance. Pt requiring Mod-Max A for dressing, bathing, and toileting. Pt with decreased cognittion and not a reliable historian     Vision         Perception  Praxis      Pertinent Vitals/Pain Pain Assessment: No/denies pain     Hand Dominance Right   Extremity/Trunk Assessment Upper Extremity Assessment Upper Extremity Assessment: LUE deficits/detail LUE Deficits / Details: pt with residual weakness from prior CVAs; functionally very weak  and limited. Pt with limited ROM and contracted LUE Coordination: decreased gross motor;decreased fine motor   Lower Extremity Assessment Lower Extremity Assessment: Defer to PT evaluation LLE Deficits / Details: pt with residual weakness from prior CVAs; functionally very weak and limited, unable to move LE during transfer   Cervical / Trunk Assessment Cervical / Trunk Assessment: Kyphotic   Communication Communication Communication: Expressive difficulties   Cognition Arousal/Alertness: Awake/alert Behavior During Therapy: WFL for tasks assessed/performed Overall Cognitive Status: History of cognitive impairments - at baseline                                 General Comments: Pt with decreased orientation, attention, memory, and awareness.   General Comments       Exercises     Shoulder Instructions      Home Living Family/patient expects to be discharged to:: Skilled nursing facility                                        Prior Functioning/Environment Level of Independence: Needs assistance  Gait / Transfers Assistance Needed: pt reported that he ambulates with use of a cane ADL's / Homemaking Assistance Needed: Pt reports that no one helps him with his ADLs   Comments: pt unreliable historian secondary to cognitive deficits at baseline and no family/caregivers present to provide information        OT Problem List: Decreased strength;Decreased range of motion;Decreased activity tolerance;Impaired balance (sitting and/or standing);Decreased cognition;Decreased safety awareness;Decreased coordination;Decreased knowledge of use of DME or AE;Decreased knowledge of precautions;Pain;Impaired UE functional use      OT Treatment/Interventions: Self-care/ADL training;Therapeutic exercise;Energy conservation;DME and/or AE instruction;Therapeutic activities;Patient/family education    OT Goals(Current goals can be found in the care plan section)  Acute Rehab OT Goals Patient Stated Goal: none stated OT Goal Formulation: With patient Time For Goal Achievement: 01/09/17 Potential to Achieve Goals: Good ADL Goals Pt Will Perform Grooming: with set-up;with supervision;sitting Pt Will Perform Upper Body Dressing: with supervision;with set-up;sitting Pt Will Transfer to Toilet: with min assist;with +2 assist;bedside commode;ambulating Pt Will Perform Toileting - Clothing Manipulation and hygiene: with min assist;sit to/from stand  OT Frequency: Min 2X/week   Barriers to D/C:            Co-evaluation PT/OT/SLP Co-Evaluation/Treatment: Yes Reason for Co-Treatment: For patient/therapist safety PT goals addressed during session: Mobility/safety with mobility OT goals addressed during session: ADL's and self-care      AM-PAC PT "6 Clicks" Daily Activity     Outcome Measure Help from another person eating meals?: A Little Help from another person taking care of personal grooming?: A Little Help from another person toileting, which includes using toliet, bedpan, or urinal?: A Lot Help from another person bathing (including washing, rinsing, drying)?: A Lot Help from another person to put on and taking off regular upper body clothing?: A Lot Help from another person to put on and taking off regular lower body clothing?: A Lot 6 Click Score: 14   End of Session Equipment Utilized During Treatment: Gait belt Nurse Communication: Mobility  status  Activity Tolerance: Patient tolerated treatment well;Patient limited by fatigue Patient left: in chair;with call bell/phone within reach;with chair alarm set  OT Visit Diagnosis: Unsteadiness on feet (R26.81);Other abnormalities of gait and mobility (R26.89);Muscle weakness (generalized) (M62.81);History of falling (Z91.81);Pain;Hemiplegia and hemiparesis;Other symptoms and signs involving cognitive function Hemiplegia - Right/Left: Left Hemiplegia - dominant/non-dominant:  Non-Dominant Hemiplegia - caused by: Cerebral infarction                Time: 1610-9604 OT Time Calculation (min): 26 min Charges:  OT General Charges $OT Visit: 1 Visit OT Evaluation $OT Eval Moderate Complexity: 1 Mod G-Codes:     Danielys Madry MSOT, OTR/L Acute Rehab Pager: [phone] Office: [phone]  Theodoro Grist Cola Gane 12/26/2016, 5:28 PM

## 2016-12-26 NOTE — Progress Notes (Signed)
Pharmacy Antibiotic Note  Jerry Lane is a 83 y.o. male admitted on 12/25/2016 as Code Stroke.  Pharmacy has been consulted for Levaquin dosing for suspected UTI.   Patient is afebrile and WBC normal. SCr elevated at 3.5 for estimated CrCl ~ 10-15 mL/min.   Patient received Levaquin 500 mg IV x1 in the ED.   Plan: Levaquin 500 mg IV q48 hr   Monitor renal function, clinical picture, and culture data F/u length of therapy and ability to de-escalate   Height:  (172.7 cm) Weight: 145 lb (65.8 kg) IBW/kg (Calculated) : 68.4  Temp (24hrs), Avg:98.5 F (36.9 C), Min:97.9 F (36.6 C), Max:98.7 F (37.1 C)   Recent Labs Lab 12/25/16 1905 12/25/16 2009  WBC 5.9  --   CREATININE 3.60* 3.50*    Estimated Creatinine Clearance: 13.8 mL/min (A) (by C-G formula based on SCr of 3.5 mg/dL (H)).    No Known Allergies  Antimicrobials this admission: 9/21 Levaquin >>   Microbiology results: pending   York Cerise, PharmD Clinical Pharmacist 12/26/16 1:12 AM

## 2016-12-26 NOTE — Evaluation (Signed)
Physical Therapy Evaluation Patient Details Name: Jerry Lane MRN: [id number] DOB: 06-19-1927 Today's Date: 12/26/2016   History of Present Illness  Pt is an 83 y/o male admitted secondary to L sided weakness. Pt found to have an acute ischemic stroke. PMH including but not limited to DM, HTN, CKD and hx of CVA with residual L sided weakness.  Clinical Impression  Pt presented supine in bed with HOB elevated, awake and willing to participate in therapy session. Pt is an unreliable historian with history of cognitive deficits and no family/caregivers present to provide reliable information. Per RN pt came from a SNF. Pt currently requires physical assistance for bed mobility and heavy physical assist of two for transfers. Pt would continue to benefit from skilled physical therapy services at this time while admitted and after d/c to address the below listed limitations in order to improve overall safety and independence with functional mobility.     Follow Up Recommendations SNF;Supervision/Assistance - 24 hour    Equipment Recommendations  None recommended by PT    Recommendations for Other Services       Precautions / Restrictions Precautions Precautions: Fall Restrictions Weight Bearing Restrictions: No      Mobility  Bed Mobility Overal bed mobility: Needs Assistance Bed Mobility: Supine to Sit     Supine to sit: Mod assist     General bed mobility comments: increased time and effort, pt assisting with R UE on bed rail, assist to elevate trunk and use of bed pads to position pt's hips at EOB  Transfers Overall transfer level: Needs assistance Equipment used: 2 person hand held assist Transfers: Sit to/from [employer] Sit to Stand: Mod assist;+2 safety/equipment Stand pivot transfers: Max assist;+2 physical assistance       General transfer comment: increased time and effort, cueing for technique, assist to power into standing from sitting EOB and  heavy physical assist of two for pivotal movement to chair with physical assist for movement of L LE  Ambulation/Gait                Stairs            Wheelchair Mobility    Modified Rankin (Stroke Patients Only) Modified Rankin (Stroke Patients Only) Pre-Morbid Rankin Score: Severe disability Modified Rankin: Severe disability     Balance Overall balance assessment: Needs assistance Sitting-balance support: Feet supported Sitting balance-Leahy Scale: Poor Sitting balance - Comments: reliant on R UE support   Standing balance support: During functional activity Standing balance-Leahy Scale: Zero Standing balance comment: max A for standing pericare                             Pertinent Vitals/Pain Pain Assessment: No/denies pain    Home Living Family/patient expects to be discharged to:: Skilled nursing facility                      Prior Function Level of Independence: Needs assistance   Gait / Transfers Assistance Needed: pt reported that he ambulates with use of a cane     Comments: pt unreliable historian secondary to cognitive deficits at baseline and no family/caregivers present to provide information     Hand Dominance   Dominant Hand: Right    Extremity/Trunk Assessment   Upper Extremity Assessment Upper Extremity Assessment: Defer to OT evaluation    Lower Extremity Assessment Lower Extremity Assessment: LLE deficits/detail;Generalized weakness LLE Deficits / Details:  pt with residual weakness from prior CVAs; functionally very weak and limited, unable to move LE during transfer    Cervical / Trunk Assessment Cervical / Trunk Assessment: Kyphotic  Communication   Communication: Expressive difficulties  Cognition Arousal/Alertness: Awake/alert Behavior During Therapy: WFL for tasks assessed/performed Overall Cognitive Status: History of cognitive impairments - at baseline                                         General Comments      Exercises     Assessment/Plan    PT Assessment Patient needs continued PT services  PT Problem List Decreased strength;Decreased range of motion;Decreased activity tolerance;Decreased balance;Decreased mobility;Decreased coordination;Decreased cognition;Decreased knowledge of use of DME;Decreased safety awareness;Decreased knowledge of precautions       PT Treatment Interventions DME instruction;Gait training;Functional mobility training;Balance training;Therapeutic activities;Therapeutic exercise;Neuromuscular re-education;Cognitive remediation;Patient/family education;Stair training    PT Goals (Current goals can be found in the Care Plan section)  Acute Rehab PT Goals Patient Stated Goal: none stated PT Goal Formulation: Patient unable to participate in goal setting Time For Goal Achievement: 01/09/17 Potential to Achieve Goals: Fair    Frequency Min 2X/week   Barriers to discharge        Co-evaluation PT/OT/SLP Co-Evaluation/Treatment: Yes Reason for Co-Treatment: For patient/therapist safety;To address functional/ADL transfers PT goals addressed during session: Mobility/safety with mobility;Balance;Proper use of DME;Strengthening/ROM         AM-PAC PT "6 Clicks" Daily Activity  Outcome Measure Difficulty turning over in bed (including adjusting bedclothes, sheets and blankets)?: Unable Difficulty moving from lying on back to sitting on the side of the bed? : Unable Difficulty sitting down on and standing up from a chair with arms (e.g., wheelchair, bedside commode, etc,.)?: Unable Help needed moving to and from a bed to chair (including a wheelchair)?: A Lot Help needed walking in hospital room?: Total Help needed climbing 3-5 steps with a railing? : Total 6 Click Score: 7    End of Session Equipment Utilized During Treatment: Gait belt Activity Tolerance: Patient tolerated treatment well Patient left: in chair;with call  bell/phone within reach;with chair alarm set Nurse Communication: Mobility status PT Visit Diagnosis: Other abnormalities of gait and mobility (R26.89);Other symptoms and signs involving the nervous system (R29.898)    Time: 1610-9604 PT Time Calculation (min) (ACUTE ONLY): 26 min   Charges:   PT Evaluation $PT Eval Moderate Complexity: 1 Mod     PT G Codes:        [place], PT, DPT [phone]   Alessandra Bevels Starlee Corralejo 12/26/2016, 4:53 PM

## 2016-12-26 NOTE — Consult Note (Signed)
Neurology Consultation  Reason for Consult: Stroke Referring Physician: Dr. Catha Gosselin  CC: Generalized weakness  History is obtained from: Chart HPI: Jerry Lane is a 83 y.o. male was a past medical history of diabetes, hypertension, chronic kidney disease, stroke with residual left-sided weakness per the chart, who presented to [facility] after he was found to be more weak on the left side around 3:30 PM on 12/25/2016. He was unable to walk at the time because of the weakness. He did not have any slurred speech or seizure-like activity per chart. He was seen at [facility] ED as a code stroke. Telemetry neurology evaluated the patient and no TPA was given. Noncontrast CT done at [facility] reported a questionable tiny 1 cm evolving hypodensity at the posterior right frontal lobe, precentral gyrus indicative of a possible acute stroke. An MRI was recommended and the patient was transferred to [facility] for formal neurological consultation and further workup. Patient denies any new complaints other than the ones listed above. He is a poor historian.  LKW: 3:30 PM on 12/25/2016. tpa given?: no, decision was made at the outside hospital where he was evaluated by telemedicine neurology. Premorbid modified Rankin scale (mRS): Unclear by history. Patient poor historian. Best guess-modified Rankin 2  ROS: Unable to obtain due to altered mental status.   Past Medical History:  Diagnosis Date  . Arthritis   . Diabetes mellitus    Type II  . Hypertension   . Stroke [facility])    no residual weakness   Family History  Problem Relation Age of Onset  . Anesthesia problems Neg Hx   . Hypotension Neg Hx   . Malignant hyperthermia Neg Hx   . Pseudochol deficiency Neg Hx    Social History:   reports that he quit smoking about 11 years ago. His smoking use included Cigarettes. He quit after 60.00 years of use. He has never used smokeless tobacco. He reports that he does not drink  alcohol or use drugs.  Medications  Current Facility-Administered Medications:  .  0.9 %  sodium chloride infusion, , Intravenous, Continuous, Sharl Ma, Sarina Ill, MD, Last Rate: 50 mL/hr at 12/26/16 0240 .  aspirin suppository 300 mg, 300 mg, Rectal, Daily **OR** aspirin tablet 325 mg, 325 mg, Oral, Daily, Sharl Ma, Sarina Ill, MD, 325 mg at 12/26/16 0844 .  enoxaparin (LOVENOX) injection 30 mg, 30 mg, Subcutaneous, Q24H, Sharl Ma, Sarina Ill, MD, 30 mg at 12/26/16 0844 .  insulin aspart (novoLOG) injection 0-9 Units, 0-9 Units, Subcutaneous, TID WC, Meredeth Ide, MD, 2 Units at 12/26/16 [id number] .  [START ON 12/27/2016] levofloxacin (LEVAQUIN) IVPB 500 mg, 500 mg, Intravenous, Q48H, Cherre Huger, RPH .  senna-docusate (Senokot-S) tablet 1 tablet, 1 tablet, Oral, QHS PRN, Sharl Ma, Sarina Ill, MD   Exam: Current vital signs: BP (!) 151/68 (BP Location: Left Arm)   Pulse 65   Temp 98.2 F (36.8 C) (Oral)   Resp (!) 22   Ht  (1.727 m)   Wt 65.8 kg (145 lb)   SpO2 96%   BMI 22.05 kg/m  Vital signs in last 24 hours: Temp:  [97.9 F (36.6 C)-98.7 F (37.1 C)] 98.2 F (36.8 C) (09/22 9147) Pulse Rate:  [25-142] 65 (09/22 0849) Resp:  [16-32] 22 (09/22 0100) BP: (100-157)/(48-74) 151/68 (09/22 0849) SpO2:  [80 %-100 %] 96 % (09/22 0613) Weight:  [65.8 kg (145 lb)] 65.8 kg (145 lb) (09/21 1925) GENERAL: Awake, alert in NAD HEENT: -  Normocephalic and atraumatic, dry mm, no LN++, no Thyromegally LUNGS - Clear to auscultation bilaterally with no wheezes CV - S1S2 RRR, no m/r/g, equal pulses bilaterally. ABDOMEN - Soft, nontender, nondistended with normoactive BS Ext: warm, well perfused, intact peripheral pulses, no edema NEURO:  Mental Status: AA&Ox2 Language: speech is dysarthric  Naming, repetition, fluency, and comprehension intact - follows one-step commands consistently, able to follow multistep commands. Cranial Nerves: PERRL 67mm/brisk. EOMI, visual fields full, left lower facial weakness facial  sensation intact, hearing intact, tongue/uvula/soft palate midline, normal sternocleidomastoid and trapezius muscle strength. No evidence of tongue atrophy or fibrillations Motor: 5/5 right upper and right lower extremity. 4/5 left lower extremity. 0/5 on the left shoulder, 0/5 on the left elbow flexion and extension, 0/5 left wrist flexion and extension,  2/5 small muscles of the hand as tested by grip. Tone: Increased tone in the left upper extremity, otherwise normal tone elsewhere. Decreased bulk all over. Sensation- decreased to touch on the left. Coordination: FTN intact on the right Gait- deferred  NIHSS 1a Level of Conscious.: 0 1b LOC Questions: 1 1c LOC Commands: 0 2 Best Gaze: 0 3 Visual: 0 4 Facial Palsy: 1 5a Motor Arm - left: 3 5b Motor Arm - Right: 0 6a Motor Leg - Left: 0 6b Motor Leg - Right: 0 7 Limb Ataxia: 0 8 Sensory: 1 9 Best Language: 0 10 Dysarthria: 1 11 Extinct. and Inatten.:0  TOTAL: 7  Labs I have reviewed labs in epic and the results pertinent to this consultation are:  CBC    Component Value Date/Time   WBC 6.0 12/26/2016 0535   RBC 3.98 (L) 12/26/2016 0535   HGB 10.9 (L) 12/26/2016 0535   HCT 34.1 (L) 12/26/2016 0535   PLT 409 (H) 12/26/2016 0535   MCV 85.7 12/26/2016 0535   MCH 27.4 12/26/2016 0535   MCHC 32.0 12/26/2016 0535   RDW 14.5 12/26/2016 0535   LYMPHSABS 0.9 12/25/2016 1905   MONOABS 0.3 12/25/2016 1905   EOSABS 0.1 12/25/2016 1905   BASOSABS 0.0 12/25/2016 1905    CMP     Component Value Date/Time   NA 138 12/25/2016 2009   K 4.8 12/25/2016 2009   CL 107 12/25/2016 2009   CO2 20 (L) 12/25/2016 1905   GLUCOSE 204 (H) 12/25/2016 2009   BUN 56 (H) 12/25/2016 2009   CREATININE 3.22 (H) 12/26/2016 0535   CALCIUM 8.7 (L) 12/25/2016 1905   PROT 8.1 12/25/2016 1905   ALBUMIN 3.5 12/25/2016 1905   AST 20 12/25/2016 1905   ALT 18 12/25/2016 1905   ALKPHOS 66 12/25/2016 1905   BILITOT 0.4 12/25/2016 1905   GFRNONAA 16 (L)  12/26/2016 0535   GFRAA 18 (L) 12/26/2016 0535    Lipid Panel     Component Value Date/Time   CHOL 146 12/26/2016 0535   TRIG 70 12/26/2016 0535   HDL 30 (L) 12/26/2016 0535   CHOLHDL 4.9 12/26/2016 0535   VLDL 14 12/26/2016 0535   LDLCALC 102 (H) 12/26/2016 0535   A1c 7.1  Imaging I have reviewed the images obtained:  CT-scan of the brain  12/25/16 done at [facility] right frontal hypodensity - which on my review I think was present on his CT scan from April 2018  MRI examination of the brain - watershed-looking infarcts in the right MCA PCA territory and more scattered infarcts Old left MCA territory infarct. Multiple lacunar in the basal ganglia, thalamus pons and cerebellum. Chronic small vessel disease.  MRA of the head unremarkable for significant large vessel occlusion. Mild intracranial atherosclerosis. 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  Preliminary report of the carotid ultrasound shows 60-79% ICA stenosis on the right, 1-39% on the left.  Assessment:  83 year old man with significant cerebral vascular risk factors and old stroke with residual left-sided weakness, presents with new right-sided strokes which appear to be in the watershed territory of the right MCA PCA and right MCA ACA territory. No carotid imaging available at this time.  Impression Acute ischemic stroke, likely large vessel from right carotid atherosclerosis/stenosis Evaluate for symptomatic carotid stenosis on the right.  Recommendations: Goal LDL less than 70 and A1c less than 6.5. Start atorvastatin 80 by mouth daily. MRA  of the neck without contrast. Patient not a good candidate for CTA or MRA with contrast given deranged renal function. Based on preliminary carotid Dopplers, this probably is a symptomatic carotid and will need right carotid revascularization in the coming 2 weeks. Stroke team to follow in the morning with final recommendations on carotid  revascularization. Frequent neuro checks Echocardiogram Prophylactic therapy-Antiplatelet med: Aspirin - dose  PO or  PR plus Plavix 75 given intra-cranial atherosclerosis. Risk factor modification Telemetry monitoring PT consult, OT consult, Speech consult  please page stroke NP  Or  PA  Or MD  from 8am -4 pm as this patient will be followed by the stroke team at this point.   You can look them up on [email or web address]    -- Milon Dikes, MD [facility] [phone]  If 7pm to 7am, please call on call as listed on AMION.

## 2016-12-27 ENCOUNTER — Inpatient Hospital Stay (HOSPITAL_COMMUNITY): Payer: Medicare Other

## 2016-12-27 DIAGNOSIS — N390 Urinary tract infection, site not specified: Secondary | ICD-10-CM

## 2016-12-27 DIAGNOSIS — E785 Hyperlipidemia, unspecified: Secondary | ICD-10-CM

## 2016-12-27 DIAGNOSIS — G9341 Metabolic encephalopathy: Secondary | ICD-10-CM

## 2016-12-27 DIAGNOSIS — I6789 Other cerebrovascular disease: Secondary | ICD-10-CM

## 2016-12-27 DIAGNOSIS — L8990 Pressure ulcer of unspecified site, unspecified stage: Secondary | ICD-10-CM | POA: Insufficient documentation

## 2016-12-27 DIAGNOSIS — I63231 Cerebral infarction due to unspecified occlusion or stenosis of right carotid arteries: Secondary | ICD-10-CM

## 2016-12-27 DIAGNOSIS — I63239 Cerebral infarction due to unspecified occlusion or stenosis of unspecified carotid arteries: Secondary | ICD-10-CM

## 2016-12-27 LAB — CBC
HEMATOCRIT: 35.5 % — AB (ref 39.0–52.0)
HEMOGLOBIN: 11.1 g/dL — AB (ref 13.0–17.0)
MCH: 27 pg (ref 26.0–34.0)
MCHC: 31.3 g/dL (ref 30.0–36.0)
MCV: 86.4 fL (ref 78.0–100.0)
Platelets: 463 10*3/uL — ABNORMAL HIGH (ref 150–400)
RBC: 4.11 MIL/uL — ABNORMAL LOW (ref 4.22–5.81)
RDW: 14.6 % (ref 11.5–15.5)
WBC: 5.3 10*3/uL (ref 4.0–10.5)

## 2016-12-27 LAB — BASIC METABOLIC PANEL
Anion gap: 10 (ref 5–15)
BUN: 41 mg/dL — AB (ref 6–20)
CALCIUM: 9 mg/dL (ref 8.9–10.3)
CHLORIDE: 109 mmol/L (ref 101–111)
CO2: 18 mmol/L — AB (ref 22–32)
CREATININE: 2.84 mg/dL — AB (ref 0.61–1.24)
GFR calc Af Amer: 21 mL/min — ABNORMAL LOW (ref >60)
GFR calc non Af Amer: 19 mL/min — ABNORMAL LOW (ref >60)
GLUCOSE: 107 mg/dL — AB (ref 65–99)
Potassium: 4 mmol/L (ref 3.5–5.1)
Sodium: 137 mmol/L (ref 135–145)

## 2016-12-27 LAB — ECHOCARDIOGRAM COMPLETE
CHL CUP MV DEC (S): 296
CHL CUP RV SYS PRESS: 24 mmHg
CHL CUP TV REG PEAK VELOCITY: 227 cm/s
E decel time: 296 msec
EERAT: 4.96
FS: 23 % — AB (ref 28–44)
Height: 68 in
IV/PV OW: 1.09
LA ID, A-P, ES: 41 mm
LA diam end sys: 41 mm
LA vol A4C: 32.6 ml
LA vol index: 20.2 mL/m2
LA vol: 35.8 mL
LADIAMINDEX: 2.31 cm/m2
LDCA: 3.14 cm2
LV E/e' medial: 4.96
LV E/e'average: 4.96
LV TDI E'LATERAL: 8.16
LVELAT: 8.16 cm/s
LVOTD: 20 mm
MV pk A vel: 84.2 m/s
MVPKEVEL: 40.5 m/s
PW: 12.8 mm — AB (ref 0.6–1.1)
RV LATERAL S' VELOCITY: 9.36 cm/s
RV TAPSE: 16.1 mm
TDI e' medial: 4.57
TR max vel: 227 cm/s
WEIGHTICAEL: 2320 [oz_av]

## 2016-12-27 LAB — GLUCOSE, CAPILLARY
GLUCOSE-CAPILLARY: 152 mg/dL — AB (ref 65–99)
Glucose-Capillary: 106 mg/dL — ABNORMAL HIGH (ref 65–99)
Glucose-Capillary: 107 mg/dL — ABNORMAL HIGH (ref 65–99)
Glucose-Capillary: 160 mg/dL — ABNORMAL HIGH (ref 65–99)

## 2016-12-27 LAB — MRSA PCR SCREENING: MRSA by PCR: NEGATIVE

## 2016-12-27 MED ORDER — DEXTROSE 5 % IV SOLN
1.0000 g | INTRAVENOUS | Status: DC
  Filled 2016-12-27: qty 10

## 2016-12-27 MED ORDER — LORAZEPAM 2 MG/ML IJ SOLN
1.0000 mg | Freq: Once | INTRAMUSCULAR | Status: AC
  Administered 2016-12-27: 1 mg via INTRAVENOUS
  Filled 2016-12-27: qty 1

## 2016-12-27 MED ORDER — DEXTROSE 5 % IV SOLN
1.0000 g | INTRAVENOUS | Status: DC
  Administered 2016-12-27 – 2016-12-29 (×3): 1 g via INTRAVENOUS
  Filled 2016-12-27 (×4): qty 10

## 2016-12-27 MED ORDER — RESOURCE THICKENUP CLEAR PO POWD
ORAL | Status: DC | PRN
  Filled 2016-12-27 (×2): qty 125

## 2016-12-27 NOTE — Progress Notes (Addendum)
PROGRESS NOTE    Jerry Lane  ZOX:[id number] DOB: May 01, 1927 DOA: 12/25/2016 PCP: Mirna Mires, MD   Chief Complaint  Jerry Lane presents with  . Code Stroke    Brief Narrative:  HPI on 12/25/2016 by Dr. Mauro Kaufmann Jerry Lane  is a 83 y.o. male, With history of diabetes, hypertension, stroke with residual weakness in left arm who came to hospital after Jerry Lane was found to be weak on left side around 3:30 PM. Jerry Lane was unable to ambulate due to weakness in his left leg. He denies slurred speech. No passing out. No seizure-like activity. Jerry Lane was seen in the ED and code stroke was called, Jerry Lane was evaluated by tele neurology, and no TPA was given. CT head showed Question tiny approximate 1 cm evolving hypodensity at the posterior right frontal lobe, precentral gyrus, which may reflect a tiny acute cortical ischemic infarct. MRI was recommended. Jerry Lane denies chest pain, no shortness of breath. Denies dysuria. No abdominal pain. No nausea vomiting or diarrhea. Jerry Lane also had chest x-ray which showed questionable pneumonia, CT chest was performed which did not show pneumonia. He had abnormal UA with too numerous to count WBCs. Started on IV Levaquin. Blood and urine cultures have been obtained. Assessment & Plan   Acute CVA -CT head: Question tiny approximate 1 cm evolving hypodensity at the posterior right frontal lobe, precentral gyrus, may reflect tiny acute cortical ischemic infarct -MRI brain: Acute patchy right MCA territory and right posterior watershed territory infarcts. Old small left frontal lobe/MCH are probably infarct. -MRA head: No emergent large vessel occlusion or flow-limiting stenosis. -Carotid doppler: Right 60-79% ICA stenosis. Left 1-39% ICA stenosis. -Echocardigoram pending -LDL 102, Hemoglobin A1c 7.1 -Neurology consulted and appreciated -Continue aspirin -Not sure why Jerry Lane is not on statin from his prior strokes, started atorvastatin 40 mg  UTI -UA  showed rare bacteria, TNTC WBC, moderate leukocytes, negative nitrites -Currently afebrile and no leukocytosis -urine culture >100K GNR -Jerry Lane was initially placed on levaquin- however discontinued -will start ceftriaxone today  Acute metabolic encephalopathy -suspect secondary to UTI vs Acute CVA -will treat UTI and continue to monitor  Acute kidney injury on chronic kidney disease stage IV -Baseline creatinine approximately 2.7-3.2 -Creatinine on admission 3.6, currently 2.84 -Continue to monitor BMP  Essential hypertension -Hold amlodipine, allow permissive hypertension  Diabetes mellitus, type II -Jerry Lane uses Tradjenta at home, continue to hold -Continue insulin sliding scale CBG monitoring -Hemoglobin A1c 7.1  DVT Prophylaxis  Lovenox  Code Status: Full  Family Communication: None at bedside  Disposition Plan: Admitted, pending further workup  Consultants Neurology  Procedures  Carotid Doppler  Antibiotics   Anti-infectives    Start     Dose/Rate Route Frequency Ordered Stop   12/27/16 2300  levofloxacin (LEVAQUIN) IVPB 500 mg  Status:  Discontinued     500 mg 100 mL/hr over 60 Minutes Intravenous Every 48 hours 12/26/16 0118 12/26/16 1337   12/25/16 2100  levofloxacin (LEVAQUIN) IVPB 500 mg     500 mg 100 mL/hr over 60 Minutes Intravenous  Once 12/25/16 2058 12/26/16 0026      Subjective:   Jerry Lane seen and examined today. Appears confused today. Unable to answer questions appropriately.   Objective:   Vitals:   12/26/16 1425 12/26/16 2222 12/27/16 0234 12/27/16 0547  BP: (!) 152/93 (!) 143/70 125/64 (!) 141/69  Pulse: 70 (!) 51 (!) 56 96  Resp: Temp: 97.7 F (36.5 C) 98.7 F (37.1 C) 98.2 F (  36.8 C) 97.9 F (36.6 C)  TempSrc: Oral Oral Oral Oral  SpO2: 99% 100% 91% 94%  Weight:      Height:        Intake/Output Summary (Last 24 hours) at 12/27/16 0948 Last data filed at 12/27/16 1610  Gross per 24 hour  Intake           1389.17 ml  Output              325 ml  Net          1064.17 ml   Filed Weights   12/25/16 1925  Weight: 65.8 kg (145 lb)   Exam  General: Well developed, elderly, chronically appearing, NAD  HEENT: NCAT, mucous membranes moist.   Cardiovascular: S1 S2 auscultated, irregular, 2/6 SEM  Respiratory: Clear to auscultation bilaterally with equal chest rise  Abdomen: Soft, nontender, nondistended, + bowel sounds  Extremities: warm dry without cyanosis clubbing or edema  Neuro: awake and alert, not oriented. Cannot fully assess   Data Reviewed: I have personally reviewed following labs and imaging studies  CBC:  Recent Labs Lab 12/25/16 1905 12/25/16 2009 12/26/16 0535 12/27/16 0502  WBC 5.9  --  6.0 5.3  NEUTROABS 4.6  --   --   --   HGB 10.6* 12.9* 10.9* 11.1*  HCT 32.5* 38.0* 34.1* 35.5*  MCV 85.8  --  85.7 86.4  PLT 398  --  409* 463*   Basic Metabolic Panel:  Recent Labs Lab 12/25/16 1905 12/25/16 2009 12/26/16 0535 12/26/16 1917 12/27/16 0502  NA 136 138  --  136 137  K 4.4 4.8  --  5.6* 4.0  CL 106 107  --  110 109  CO2 20*  --   --  14* 18*  GLUCOSE 225* 204*  --  97 107*  BUN 62* 56*  --  48* 41*  CREATININE 3.60* 3.50* 3.22* 3.10* 2.84*  CALCIUM 8.7*  --   --  9.0 9.0  MG  --   --   --  2.0  --    GFR: Estimated Creatinine Clearance: 17.1 mL/min (A) (by C-G formula based on SCr of 2.84 mg/dL (H)). Liver Function Tests:  Recent Labs Lab 12/25/16 1905  AST 20  ALT 18  ALKPHOS 66  BILITOT 0.4  PROT 8.1  ALBUMIN 3.5   No results for input(s): LIPASE, AMYLASE in the last 168 hours. No results for input(s): AMMONIA in the last 168 hours. Coagulation Profile:  Recent Labs Lab 12/25/16 1905  INR 1.16   Cardiac Enzymes: No results for input(s): CKTOTAL, CKMB, CKMBINDEX, TROPONINI in the last 168 hours. BNP (last 3 results) No results for input(s): PROBNP in the last 8760 hours. HbA1C:  Recent Labs  12/26/16 0554  HGBA1C 7.1*     CBG:  Recent Labs Lab 12/26/16 0807 12/26/16 1209 12/26/16 1734 12/26/16 2226 12/27/16 0755  GLUCAP 131* 72 96 91 106*   Lipid Profile:  Recent Labs  12/26/16 0535  CHOL 146  HDL 30*  LDLCALC 102*  TRIG 70  CHOLHDL 4.9   Thyroid Function Tests: No results for input(s): TSH, T4TOTAL, FREET4, T3FREE, THYROIDAB in the last 72 hours. Anemia Panel: No results for input(s): VITAMINB12, FOLATE, FERRITIN, TIBC, IRON, RETICCTPCT in the last 72 hours. Urine analysis:    Component Value Date/Time   COLORURINE YELLOW 12/25/2016 2003   APPEARANCEUR HAZY (A) 12/25/2016 2003   LABSPEC 1.015 12/25/2016 2003   PHURINE 5.0 12/25/2016 2003   GLUCOSEU  NEGATIVE 12/25/2016 2003   HGBUR NEGATIVE 12/25/2016 2003   BILIRUBINUR NEGATIVE 12/25/2016 2003   KETONESUR NEGATIVE 12/25/2016 2003   PROTEINUR 30 (A) 12/25/2016 2003   UROBILINOGEN 0.2 10/11/2006 1026   NITRITE NEGATIVE 12/25/2016 2003   LEUKOCYTESUR MODERATE (A) 12/25/2016 2003   Sepsis Labs: (procalcitonin:4,lacticidven:4)  ) Recent Results (from the past 240 hour(s))  Culture, blood (Routine X 2) w Reflex to ID Panel     Status: None (Preliminary result)   Collection Time: 12/25/16 10:35 PM  Result Value Ref Range Status   Specimen Description RIGHT ANTECUBITAL  Final   Special Requests   Final    BOTTLES DRAWN AEROBIC AND ANAEROBIC Blood Culture adequate volume   Culture NO GROWTH 2 DAYS  Final   Report Status PENDING  Incomplete  Culture, blood (Routine X 2) w Reflex to ID Panel     Status: None (Preliminary result)   Collection Time: 12/25/16 10:49 PM  Result Value Ref Range Status   Specimen Description BLOOD LEFT HAND  Final   Special Requests   Final    BOTTLES DRAWN AEROBIC ONLY Blood Culture results may not be optimal due to an inadequate volume of blood received in culture bottles   Culture PENDING  Incomplete   Report Status PENDING  Incomplete  MRSA PCR Screening     Status: None   Collection  Time: 12/26/16 11:02 PM  Result Value Ref Range Status   MRSA by PCR NEGATIVE NEGATIVE Final    Comment:        The GeneXpert MRSA Assay (FDA approved for NASAL specimens only), is one component of a comprehensive MRSA colonization surveillance program. It is not intended to diagnose MRSA infection nor to guide or monitor treatment for MRSA infections.       Radiology Studies: Ct Chest Wo Contrast  Result Date: 12/25/2016 CLINICAL DATA:  Altered mental status. Interstitial lung disease. Increased weakness to the left side and now has difficulty moving the left side. EXAM: CT CHEST WITHOUT CONTRAST TECHNIQUE: Multidetector CT imaging of the chest was performed following the standard protocol without IV contrast. COMPARISON:  07/08/2004 FINDINGS: Cardiovascular: Normal heart size. No pericardial effusion. Coronary artery and aortic calcifications. Diffusely ectatic thoracic aorta. Ascending aorta measures 3.6 cm diameter. Descending aorta measures 3.2 cm diameter. Mediastinum/Nodes: Esophagus is decompressed. Scattered mediastinal lymph nodes are not pathologically enlarged. No abnormal mediastinal fluid collection or gas. Lungs/Pleura: There is chronic left apical pleural thickening, scarring, and volume loss in the left upper lung with scarring and honeycomb changes. This appearance is similar to the previous study without significant progression. Scarring and emphysematous changes also in the right upper lung as before. No definite evidence of any developing mass or acute consolidation. No pleural effusions. No pneumothorax. Diffuse emphysematous changes throughout the lungs. Upper Abdomen: Configuration of the liver suggest possible hepatic cirrhosis. Diffuse calcification throughout the pancreas consistent with chronic pancreatitis. No acute process suggested in the visualized upper abdomen. Musculoskeletal: Degenerative changes in the spine. No destructive bone lesions. IMPRESSION: 1. Diffuse  emphysematous change throughout the lungs. Chronic bilateral apical scarring, chronic pleural thickening and volume loss in the left upper lung with scarring and honeycomb changes. No significant change since 2006. No evidence of acute consolidation. 2. A ectatic thoracic aorta. Aortic and coronary artery calcifications. 3. Probable changes of hepatic cirrhosis. Pancreatic calcification consistent with chronic pancreatitis. Aortic Atherosclerosis (ICD10-I70.0) and Emphysema (ICD10-J43.9). Electronically Signed   By: Burman Nieves M.D.   On: 12/25/2016 21:09  Mr Brain Wo Contrast  Result Date: 12/26/2016 CLINICAL DATA:  Ataxia, LEFT-sided weakness. Follow-up possible acute stroke. History of hypertension, diabetes. EXAM: MRI HEAD WITHOUT CONTRAST MRA HEAD WITHOUT CONTRAST TECHNIQUE: Multiplanar, multiecho pulse sequences of the brain and surrounding structures were obtained without intravenous contrast. Angiographic images of the head were obtained using MRA technique without contrast. COMPARISON:  CT HEAD December 25, 2016 FINDINGS: MRI HEAD FINDINGS BRAIN: Patchy reduced diffusion in RIGHT frontal, RIGHT parietal, RIGHT occipital lobe laterally with low ADC values. Chronic microhemorrhage LEFT frontal lobe. Faint mineralization associated with old small pontine and cerebellar infarcts. Old bilateral basal ganglia and thalamus infarcts. Prominent basal ganglia and thalamus perivascular spaces associated with chronic hypertension. Patchy to confluent supratentorial white matter FLAIR T2 hyperintensities. No midline shift, mass effect or masses. Small area LEFT frontal encephalomalacia. VASCULAR: Normal major intracranial vascular flow voids present at skull base. SKULL AND UPPER CERVICAL SPINE: No abnormal sellar expansion. No suspicious calvarial bone marrow signal. Craniocervical junction maintained. SINUSES/ORBITS: The mastoid air-cells and included paranasal sinuses are well-aerated. The included ocular  globes and orbital contents are non-suspicious. OTHER: Jerry Lane is edentulous. MRA HEAD FINDINGS ANTERIOR CIRCULATION: Normal flow related enhancement of the included cervical, petrous, cavernous and supraclinoid internal carotid arteries. Patent anterior communicating artery, 2 mm medially directed LEFT A1-2 junction aneurysm. Mild stenosis RIGHT proximal M1 segment. Patent anterior and middle cerebral arteries, including distal segments. No large vessel occlusion, high-grade stenosis . POSTERIOR CIRCULATION: Codominant vertebral artery's, mild stenosis distal LEFT V4 segment. Basilar artery is patent, with normal flow related enhancement of the main branch vessels. Patent posterior cerebral arteries. Robust bilateral posterior communicating arteries present. Mild tandem stenoses bilateral posterior cerebral artery's. No large vessel occlusion, high-grade stenosis,  aneurysm. ANATOMIC VARIANTS: None. Source images and MIP images were reviewed. IMPRESSION: MRI HEAD: 1. Acute patchy RIGHT MCA territory and RIGHT posterior watershed territory infarcts. 2. Old small LEFT frontal lobe/MCA territory infarct. 3. Multiple old small basal ganglia, thalamus, pontine and cerebellar infarcts. 4. Moderate to severe chronic small vessel ischemic disease. MRA HEAD: 1. No emergent large vessel occlusion or flow limiting stenosis. Given above MRI findings, recommend angiographic imaging of the neck to evaluate for proximal stenosis. 2. Mild intracranial atherosclerosis. 3. 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 Electronically Signed   By: Awilda Metro M.D.   On: 12/26/2016 05:08   Dg Chest Portable 1 View  Result Date: 12/25/2016 CLINICAL DATA:  Cough EXAM: PORTABLE CHEST 1 VIEW COMPARISON:  07/13/2016 FINDINGS: Development of multifocal opacities within the lung apices and left base. No pleural effusion. Mild cardiomegaly. Stable slightly enlarged cardiomediastinal silhouette with tortuosity of the aortic arch. No pneumothorax.  Possible left hilar density IMPRESSION: New multifocal opacities within the apical lungs and left base which may reflect multifocal pneumonia. Possible left hilar opacity, CT chest suggested for further evaluation. Electronically Signed   By: Jasmine Pang M.D.   On: 12/25/2016 20:07   Mr Maxine Glenn Head/brain VW Cm  Result Date: 12/26/2016 CLINICAL DATA:  Ataxia, LEFT-sided weakness. Follow-up possible acute stroke. History of hypertension, diabetes. EXAM: MRI HEAD WITHOUT CONTRAST MRA HEAD WITHOUT CONTRAST TECHNIQUE: Multiplanar, multiecho pulse sequences of the brain and surrounding structures were obtained without intravenous contrast. Angiographic images of the head were obtained using MRA technique without contrast. COMPARISON:  CT HEAD December 25, 2016 FINDINGS: MRI HEAD FINDINGS BRAIN: Patchy reduced diffusion in RIGHT frontal, RIGHT parietal, RIGHT occipital lobe laterally with low ADC values. Chronic microhemorrhage LEFT frontal lobe. Faint mineralization associated with  old small pontine and cerebellar infarcts. Old bilateral basal ganglia and thalamus infarcts. Prominent basal ganglia and thalamus perivascular spaces associated with chronic hypertension. Patchy to confluent supratentorial white matter FLAIR T2 hyperintensities. No midline shift, mass effect or masses. Small area LEFT frontal encephalomalacia. VASCULAR: Normal major intracranial vascular flow voids present at skull base. SKULL AND UPPER CERVICAL SPINE: No abnormal sellar expansion. No suspicious calvarial bone marrow signal. Craniocervical junction maintained. SINUSES/ORBITS: The mastoid air-cells and included paranasal sinuses are well-aerated. The included ocular globes and orbital contents are non-suspicious. OTHER: Jerry Lane is edentulous. MRA HEAD FINDINGS ANTERIOR CIRCULATION: Normal flow related enhancement of the included cervical, petrous, cavernous and supraclinoid internal carotid arteries. Patent anterior communicating artery, 2  mm medially directed LEFT A1-2 junction aneurysm. Mild stenosis RIGHT proximal M1 segment. Patent anterior and middle cerebral arteries, including distal segments. No large vessel occlusion, high-grade stenosis . POSTERIOR CIRCULATION: Codominant vertebral artery's, mild stenosis distal LEFT V4 segment. Basilar artery is patent, with normal flow related enhancement of the main branch vessels. Patent posterior cerebral arteries. Robust bilateral posterior communicating arteries present. Mild tandem stenoses bilateral posterior cerebral artery's. No large vessel occlusion, high-grade stenosis,  aneurysm. ANATOMIC VARIANTS: None. Source images and MIP images were reviewed. IMPRESSION: MRI HEAD: 1. Acute patchy RIGHT MCA territory and RIGHT posterior watershed territory infarcts. 2. Old small LEFT frontal lobe/MCA territory infarct. 3. Multiple old small basal ganglia, thalamus, pontine and cerebellar infarcts. 4. Moderate to severe chronic small vessel ischemic disease. MRA HEAD: 1. No emergent large vessel occlusion or flow limiting stenosis. Given above MRI findings, recommend angiographic imaging of the neck to evaluate for proximal stenosis. 2. Mild intracranial atherosclerosis. 3. 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 Electronically Signed   By: Awilda Metro M.D.   On: 12/26/2016 05:08   Ct Head Code Stroke Wo Contrast  Result Date: 12/25/2016 CLINICAL DATA:  Code stroke. Initial evaluation for increase left-sided weakness for 4 hours. EXAM: CT HEAD WITHOUT CONTRAST TECHNIQUE: Contiguous axial images were obtained from the base of the skull through the vertex without intravenous contrast. COMPARISON:  Prior CT from 07/13/2016. FINDINGS: Brain: Advanced age-related cerebral atrophy with chronic microvascular ischemic disease. Encephalomalacia within the anterior left frontal lobe compatible with remote left MCA territory infarct. Multiple remote lacunar infarcts present within the bilateral basal ganglia and  thalami as well as the brainstem. Few small remote right cerebellar infarcts. There is question of a tiny approximate 1 cm hypodensity involving the cortical gray matter of the posterior left frontal lobe, precentral gyrus/ motor strip, which may reflect an evolving tiny acute ischemic infarct (series 2, image 29). Finding not entirely certain given the small size. No other definite acute large vessel territory infarct. No acute intracranial hemorrhage. No mass lesion or midline shift. No mass effect. No hydrocephalus. No extra-axial fluid collection. Vascular: No worrisome hyperdense vessel. Prominent vascular calcifications noted within the carotid siphons and distal vertebral artery's. Skull: Scalp soft tissues and calvarium within normal limits. Sinuses/Orbits: Globes and orbital soft tissues within normal limits. Paranasal sinuses are clear. Small right mastoid effusion noted. Other: None. ASPECTS [facility] Stroke Program Early CT Score) - Ganglionic level infarction (caudate, lentiform nuclei, internal capsule, insula, M1-M3 cortex): 7 - Supraganglionic infarction (M4-M6 cortex): 3 Total score (0-10 with 10 being normal): 10 IMPRESSION: 1. Question tiny approximate 1 cm evolving hypodensity at the posterior right frontal lobe, precentral gyrus, which may reflect a tiny acute cortical ischemic infarct. Finding could be further assessed with dedicated MRI as desired.  No acute intracranial hemorrhage. 2. ASPECTS is 10 3. Otherwise stable atrophy and chronic microvascular ischemic disease with multiple remote infarcts as above. Critical Value/emergent results were called by telephone at the time of interpretation on 12/25/2016 at 7:24 pm to Dr. Bethann Berkshire , who verbally acknowledged these results. Electronically Signed   By: Rise Mu M.D.   On: 12/25/2016 19:33     Scheduled Meds: . aspirin  300 mg Rectal Daily   Or  . aspirin  325 mg Oral Daily  . atorvastatin  40 mg Oral q1800  .  clopidogrel  75 mg Oral Daily  . enoxaparin (LOVENOX) injection  30 mg Subcutaneous Q24H  . insulin aspart  0-9 Units Subcutaneous TID WC   Continuous Infusions: . sodium chloride 50 mL/hr at 12/27/16 0220     LOS: 2 days   Time Spent in minutes   30 minutes  Breeann Reposa D.O. on 12/27/2016 at 9:48 AM  Between 7am to 7pm - Pager - [phone]  After 7pm go to [email or web address] - password [email or web address]  And look for the night coverage person covering for me after hours  [facility] Office  [phone]

## 2016-12-27 NOTE — Evaluation (Signed)
Speech Language Pathology Evaluation Patient Details Name: Jerry Lane MRN: [id number] DOB: Jan 05, 1928 Today's Date: 12/27/2016 Time: 4098-1191 SLP Time Calculation (min) (ACUTE ONLY): 14 min  Problem List:  Patient Active Problem List   Diagnosis Date Noted  . Pressure injury of skin 12/27/2016  . Stroke (cerebrum) (HCC) 12/25/2016  . Anemia 01/09/2016  . CKD (chronic kidney disease) stage 3, GFR 30-59 ml/min 01/09/2016  . Hyperkalemia 10/20/2010  . Renal failure 10/20/2010  . DM (diabetes mellitus), type 2 with renal complications (HCC) 10/20/2010  . HTN (hypertension) 10/20/2010  . Cerebrovascular disease 10/20/2010   Past Medical History:  Past Medical History:  Diagnosis Date  . Arthritis   . Diabetes mellitus    Type II  . Hypertension   . Stroke [facility])    no residual weakness   Past Surgical History:  Past Surgical History:  Procedure Laterality Date  . TIBIA FRACTURE SURGERY  1987   Right, [facility]?   HPI:  Pt is an 83 y/o male admitted secondary to L sided weakness. Pt found to have an acute ischemic stroke. PMH including but not limited to DM, HTN, CKD and hx of CVA with residual L sided weakness. MRI 12/26/16 revealed acute patchy RIGHT MCA territory and RIGHT posterior watershed territory infarcts. Old small LEFT frontal lobe/MCA territory infarct, multiple old small basal ganglia, thalamus, pontine and cerebellar infarcts. Pt having difficulty masticating solids, with wet vocal quality despite passing RN swallow screen at [facility].    Assessment / Plan / Recommendation Clinical Impression  Patient presents with moderate dysarthria impacting intelligibility as well as cognitive communication impairment with deficits in sustained attention, memory, problem solving, safety awareness. Difficult to determine patient's prior level of function as no family members present. Pt's voice is notably hoarse, which he reports is not his baseline. Along with suspected aspiration of  secretions, concerning for VC involvement. Pt with generalized oral weakness, significant lingual weakness. Pt oriented to self only, disoriented to location, time/date and situation. Pt unable to state his birthplace or current president; question whether this is due to impaired recall or possible language impairment given old L MCA territory infarcts. Follows simple contextual commands accurately, decreased accuracy with multi-step or increased complexity (suspect working memory/attention vs language). Recommend skilled ST services to address the above impairments to maxmize cognitive and communication abilities for safety, reduced caregiver burden and to improve quality of life. Anticipate he will need ongoing SLP services post d/c.     SLP Assessment  SLP Recommendation/Assessment: Patient needs continued Speech Lanaguage Pathology Services SLP Visit Diagnosis: Dysarthria and anarthria (R47.1);Cognitive communication deficit (R41.841)    Follow Up Recommendations  Skilled Nursing facility    Frequency and Duration min 2x/week  2 weeks      SLP Evaluation Cognition  Overall Cognitive Status: History of cognitive impairments - at baseline Arousal/Alertness: Awake/alert Orientation Level: Oriented to person;Disoriented to place;Disoriented to time;Disoriented to situation Attention: Focused;Sustained Focused Attention: Appears intact Sustained Attention: Impaired Sustained Attention Impairment: Verbal basic;Functional basic Memory: Impaired Memory Impairment: Decreased long term memory;Decreased short term memory Decreased Long Term Memory: Verbal basic;Functional basic Decreased Short Term Memory: Verbal basic;Functional basic Awareness: Impaired Awareness Impairment: Intellectual impairment Problem Solving: Impaired Problem Solving Impairment: Verbal basic;Functional basic Safety/Judgment: Impaired       Comprehension  Auditory Comprehension Overall Auditory Comprehension:  Appears within functional limits for tasks assessed Yes/No Questions: Within Functional Limits Commands:  (simple one-step commands WFL) Visual Recognition/Discrimination Discrimination: Not tested Reading Comprehension Reading Status: Not tested  Expression Verbal Expression Overall Verbal Expression: Impaired Initiation: No impairment Level of Generative/Spontaneous Verbalization: Sentence Naming: Not tested Pragmatics: Impairment Impairments: Dysprosody;Eye contact Interfering Components: Attention Written Expression Dominant Hand: Right Written Expression: Not tested   Oral / Motor  Oral Motor/Sensory Function Overall Oral Motor/Sensory Function: Generalized oral weakness Lingual Strength: Reduced Motor Speech Overall Motor Speech: Impaired Respiration: Within functional limits Phonation: Hoarse;Low vocal intensity;Wet Articulation: Impaired Level of Impairment: Word Intelligibility: Intelligibility reduced Word: 75-100% accurate Phrase: 75-100% accurate Sentence: 50-74% accurate Motor Planning: Witnin functional limits Motor Speech Errors: Consistent Interfering Components: Inadequate dentition;Premorbid status Effective Techniques: Slow rate;Increased vocal intensity;Over-articulate   GO                   Rondel Baton, [place], CCC-SLP Speech-Language Pathologist [phone]  Arlana Lindau 12/27/2016, 3:30 PM

## 2016-12-27 NOTE — Progress Notes (Signed)
STROKE TEAM PROGRESS NOTE   HISTORY OF PRESENT ILLNESS (per record) Jerry Lane is a 83 y.o. male was a past medical history of diabetes, hypertension, chronic kidney disease, stroke with residual left-sided weakness per the chart, who presented to [facility] after he was found to be more weak on the left side around 3:30 PM on 12/25/2016. He was unable to walk at the time because of the weakness. He did not have any slurred speech or seizure-like activity per chart. He was seen at [facility] ED as a code stroke. Telemetry neurology evaluated the patient and no TPA was given. Noncontrast CT done at [facility] reported a questionable tiny 1 cm evolving hypodensity at the posterior right frontal lobe, precentral gyrus indicative of a possible acute stroke. An MRI was recommended and the patient was transferred to [facility] for formal neurological consultation and further workup. Patient denies any new complaints other than the ones listed above. He is a poor historian.  LKW: 3:30 PM on 12/25/2016. tpa given?: no, decision was made at the outside hospital where he was evaluated by telemedicine neurology. Premorbid modified Rankin scale (mRS): Unclear by history. Patient poor historian. Best guess-modified Rankin 2   SUBJECTIVE (INTERVAL HISTORY) His church friends are at the bedside. Pt severe dysarthric, not fully orientated. As per friends, he has baseline residue left sided hemiparesis from his previous stroke. This time it seems that his left arm weakness worsened, but left leg seems same as before. He was able to walk with walker or cane at home and at church.    OBJECTIVE Temp:  [97.7 F (36.5 C)-98.7 F (37.1 C)] 97.9 F (36.6 C) (09/23 0547) Pulse Rate:  [51-96] 96 (09/23 0547) Cardiac Rhythm: Sinus bradycardia;Heart block;Bundle branch block (09/23 0806) Resp:  [17-20] 17 (09/23 0547) BP: (125-152)/(64-93) 141/69 (09/23 0547) SpO2:  [91 %-100 %] 94 % (09/23  0547)  CBC:  Recent Labs Lab 12/25/16 1905  12/26/16 0535 12/27/16 0502  WBC 5.9  --  6.0 5.3  NEUTROABS 4.6  --   --   --   HGB 10.6*  < > 10.9* 11.1*  HCT 32.5*  < > 34.1* 35.5*  MCV 85.8  --  85.7 86.4  PLT 398  --  409* 463*  < > = values in this interval not displayed.  Basic Metabolic Panel:   Recent Labs Lab 12/26/16 1917 12/27/16 0502  NA 136 137  K 5.6* 4.0  CL 110 109  CO2 14* 18*  GLUCOSE 97 107*  BUN 48* 41*  CREATININE 3.10* 2.84*  CALCIUM 9.0 9.0  MG 2.0  --     Lipid Panel:     Component Value Date/Time   CHOL 146 12/26/2016 0535   TRIG 70 12/26/2016 0535   HDL 30 (L) 12/26/2016 0535   CHOLHDL 4.9 12/26/2016 0535   VLDL 14 12/26/2016 0535   LDLCALC 102 (H) 12/26/2016 0535   HgbA1c:  Lab Results  Component Value Date   HGBA1C 7.1 (H) 12/26/2016   Urine Drug Screen:     Component Value Date/Time   LABOPIA NONE DETECTED 12/25/2016 2003   COCAINSCRNUR NONE DETECTED 12/25/2016 2003   LABBENZ NONE DETECTED 12/25/2016 2003   AMPHETMU NONE DETECTED 12/25/2016 2003   THCU NONE DETECTED 12/25/2016 2003   LABBARB NONE DETECTED 12/25/2016 2003    Alcohol Level     Component Value Date/Time   ETH <5 12/25/2016 1905    IMAGING I have personally reviewed  the radiological images below and agree with the radiology interpretations.  Ct Head Code Stroke Wo Contrast 12/25/2016 IMPRESSION:  1. Question tiny approximate 1 cm evolving hypodensity at the posterior right frontal lobe, precentral gyrus, which may reflect a tiny acute cortical ischemic infarct. Finding could be further assessed with dedicated MRI as desired. No acute intracranial hemorrhage.  2. ASPECTS is 10  3. Otherwise stable atrophy and chronic microvascular ischemic disease with multiple remote infarcts as above.   Mr Brain Wo Contrast 12/26/2016 IMPRESSION:  MRI HEAD:  1. Acute patchy RIGHT MCA territory and RIGHT posterior watershed territory infarcts.  2. Old small LEFT  frontal lobe/MCA territory infarct.  3. Multiple old small basal ganglia, thalamus, pontine and cerebellar infarcts.  4. Moderate to severe chronic small vessel ischemic disease.   MRA HEAD:  1. No emergent large vessel occlusion or flow limiting stenosis. Given above MRI findings, recommend angiographic imaging of the neck to evaluate for proximal stenosis.  2. Mild intracranial atherosclerosis.  3. 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   Dg Chest Portable 1 View 12/25/2016 IMPRESSION:  New multifocal opacities within the apical lungs and left base which may reflect multifocal pneumonia. Possible left hilar opacity, CT chest suggested for further evaluation.   Ct Chest Wo Contrast 12/25/2016 IMPRESSION:  1. Diffuse emphysematous change throughout the lungs. Chronic bilateral apical scarring, chronic pleural thickening and volume loss in the left upper lung with scarring and honeycomb changes. No significant change since 2006. No evidence of acute consolidation.  2. A ectatic thoracic aorta. Aortic and coronary artery calcifications.  3. Probable changes of hepatic cirrhosis. Pancreatic calcification consistent with chronic pancreatitis. Aortic Atherosclerosis (ICD10-I70.0) and Emphysema (ICD10-J43.9).   Transthoracic Echocardiogram  12/27/2016 Study Conclusions - Left ventricle: The cavity size was normal. Wall thickness was   increased in a pattern of mild LVH. Systolic function was normal.   The estimated ejection fraction was in the range of 55% to 60%.   Wall motion was normal; there were no regional wall motion   abnormalities. Doppler parameters are consistent with abnormal   left ventricular relaxation (grade 1 diastolic dysfunction). - Aortic valve: Mildly calcified annulus. Trileaflet; mildly   calcified leaflets. There was trivial regurgitation. - Aortic root: The aortic root was mildly ectatic. - Mitral valve: Mildly calcified annulus. There was trivial   regurgitation. -  Atrial septum: No defect or patent foramen ovale was identified. - Tricuspid valve: There was trivial regurgitation. - Pulmonary arteries: PA peak pressure: 24 mm Hg (S). - Pericardium, extracardiac: There was no pericardial effusion. Impressions: - Mild LVH with LVEF 55-60% and grade 1 diastolic dysfunction.   Mildly sclerotic aortic valve with trivial aortic regurgitation.   Trivial mitral regurgitation. Trivial tricuspid regurgitation   with PASP 24 mmHg. No obvious PFO or ASD.  Bilateral Carotid Dopplers  12/26/2016 Right 60-79% ICA stenosis. Left 1-39% ICA stenosis.    PHYSICAL EXAM Vitals:   12/26/16 1425 12/26/16 2222 12/27/16 0234 12/27/16 0547  BP: (!) 152/93 (!) 143/70 125/64 (!) 141/69  Pulse: 70 (!) 51 (!) 56 96  Resp: Temp: 97.7 F (36.5 C) 98.7 F (37.1 C) 98.2 F (36.8 C) 97.9 F (36.6 C)  TempSrc: Oral Oral Oral Oral  SpO2: 99% 100% 91% 94%  Weight:      Height:        Temp:  [97.7 F (36.5 C)-98.2 F (36.8 C)] 97.7 F (36.5 C) (09/23 2201) Pulse Rate:  [56-96] 61 (09/23  2201) Resp:  [17-19] 18 (09/23 2201) BP: (125-141)/(62-76) 133/76 (09/23 2201) SpO2:  [91 %-100 %] 100 % (09/23 1729)  General - cachectic, well developed, in no apparent distress.  Ophthalmologic - Fundi not visualized due to noncooperation.  Cardiovascular - Regular rate and rhythm.  Mental Status -  Awake alert but severe dysarthria. Know he is in hospital but did not know the name of hospital, not orientated to time or people. Able to follow simple commands, but psychomotor slowing, naming 3/4 and able to repeat.  Cranial Nerves II - XII - II - blinking to visual threat bilaterally. III, IV, VI - Extraocular movements intact. V - Facial sensation intact bilaterally. VII - Facial movement intact bilaterally. VIII - hard of hearing & vestibular intact bilaterally. X - Palate elevates symmetrically, severe dysarthria. XI - Chin turning & shoulder shrug intact  bilaterally. XII - Tongue protrusion intact.  Motor Strength - The patient's strength was 4/5 RUE and BLEs, however, left UE 0/5 proximal and 2/5 distally, contracture at the elbow. Bulk was decreased LUE and fasciculations were absent.   Motor Tone - Muscle tone was assessed at the neck and appendages and was significantly increased muscle tone.  Reflexes - The patient's reflexes were 1+ in all extremities except 3+ LUE and he had no pathological reflexes.  Sensory - Light touch, temperature/pinprick were assessed and were symmetrical.    Coordination - not cooperative.  Tremor was absent.  Gait and Station - not tested   ASSESSMENT/PLAN Mr. KALON ERHARDT is a 83 y.o. male with history off diabetes, hypertension, emphysema, chronic kidney disease, and previous strokes presenting with increased left-sided weakness. He did not receive IV t-PA due to mild deficits.  Stroke:  Acute patchy Rt MCA territory and Rt posterior watershed territory infarcts - possibly embolic from high grade stenosis of right ICA proximal.  Resultant  Confusion, worsening left spastic hemiparesis  CT head - possible small infarct posterior right frontal lobe  MRI head - multiple acute right-sided infarcts as well as multiple remote infarcts.  MRA head - no high-grade stenosis. 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   Carotid Doppler - Right 60-79% ICA stenosis.   2D Echo - EF 55-60%. No cardiac source of emboli identified.  LDL - 102  HgbA1c - 7.1  VTE prophylaxis -  - Lovenox Diet heart healthy/carb modified Room service appropriate? Yes; Fluid consistency: Thin  No antithrombotic prior to admission, now on aspirin 325 mg daily and clopidogrel 75 mg daily.  Patient counseled to be compliant with his antithrombotic medications  Ongoing aggressive stroke risk factor management  Therapy recommendations: SNF recommended  Disposition: Pending  Right ICA stenosis, symptomatic  CUS with right ICA 60-79%  stenosis  Pt advance age, poor baseline, cognitive impairment, likely not a good candidate for CEA procedure.   Eventually still recommend VVS consultation either inpt or outpt follow up  Hx of stroke  Residue left spastic hemiparesis  He still able to walk with walker or cane  lacunar infarcts present within the b/l BG and thalami and brainstem as well as few small remote right cerebellar infarcts.  On DAPT  Hypertension  Stable  Permissive hypertension (OK if < 220/120) but gradually normalize in 5-7 days  Long-term BP goal normotensive  Hyperlipidemia  Home meds:  No lipid lowering medications prior to admission.  LDL 102, goal < 70  Now on Lipitor 40 mg daily  Continue statin at discharge  Diabetes  HgbA1c 7.1, goal <  7.0  Uncontrolled  SSI  UTI  UA WBCTNTC  On rocephin  Hydration   CKD  Cre 3.5-3.22-3.10-2.84  Other Stroke Risk Factors  Advanced age  Former cigarette smoker. Quit approximately 11 years ago.  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 Please call with questions. Pt will follow up with Dr. Darrol Angel at [facility] in about 6 weeks. Thanks for the consult.  Marvel Plan, MD PhD Stroke Neurology 12/27/2016 10:45 PM   To contact Stroke Continuity provider, please refer to [email or web address]. After hours, contact General Neurology

## 2016-12-27 NOTE — Progress Notes (Signed)
PROGRESS NOTE    Jerry Lane  ZOX:[id number] DOB: May 01, 1927 DOA: 12/25/2016 PCP: Mirna Mires, MD   Chief Complaint  Patient presents with  . Code Stroke    Brief Narrative:  HPI on 12/25/2016 by Dr. Mauro Kaufmann Jerry Lane  is a 83 y.o. male, With history of diabetes, hypertension, stroke with residual weakness in left arm who came to hospital after patient was found to be weak on left side around 3:30 PM. Patient was unable to ambulate due to weakness in his left leg. He denies slurred speech. No passing out. No seizure-like activity. Patient was seen in the ED and code stroke was called, patient was evaluated by tele neurology, and no TPA was given. CT head showed Question tiny approximate 1 cm evolving hypodensity at the posterior right frontal lobe, precentral gyrus, which may reflect a tiny acute cortical ischemic infarct. MRI was recommended. Patient denies chest pain, no shortness of breath. Denies dysuria. No abdominal pain. No nausea vomiting or diarrhea. Patient also had chest x-ray which showed questionable pneumonia, CT chest was performed which did not show pneumonia. He had abnormal UA with too numerous to count WBCs. Started on IV Levaquin. Blood and urine cultures have been obtained. Assessment & Plan   Acute CVA -CT head: Question tiny approximate 1 cm evolving hypodensity at the posterior right frontal lobe, precentral gyrus, may reflect tiny acute cortical ischemic infarct -MRI brain: Acute patchy right MCA territory and right posterior watershed territory infarcts. Old small left frontal lobe/MCH are probably infarct. -MRA head: No emergent large vessel occlusion or flow-limiting stenosis. -Carotid doppler: Right 60-79% ICA stenosis. Left 1-39% ICA stenosis. -Echocardigoram pending -LDL 102, Hemoglobin A1c 7.1 -Neurology consulted and appreciated -Continue aspirin -Not sure why patient is not on statin from his prior strokes, started atorvastatin 40 mg  UTI -UA  showed rare bacteria, TNTC WBC, moderate leukocytes, negative nitrites -Currently afebrile and no leukocytosis -urine culture >100K GNR -patient was initially placed on levaquin- however discontinued -will start ceftriaxone today  Acute metabolic encephalopathy -suspect secondary to UTI vs Acute CVA -will treat UTI and continue to monitor  Acute kidney injury on chronic kidney disease stage IV -Baseline creatinine approximately 2.7-3.2 -Creatinine on admission 3.6, currently 2.84 -Continue to monitor BMP  Essential hypertension -Hold amlodipine, allow permissive hypertension  Diabetes mellitus, type II -Patient uses Tradjenta at home, continue to hold -Continue insulin sliding scale CBG monitoring -Hemoglobin A1c 7.1  DVT Prophylaxis  Lovenox  Code Status: Full  Family Communication: None at bedside  Disposition Plan: Admitted, pending further workup  Consultants Neurology  Procedures  Carotid Doppler  Antibiotics   Anti-infectives    Start     Dose/Rate Route Frequency Ordered Stop   12/27/16 2300  levofloxacin (LEVAQUIN) IVPB 500 mg  Status:  Discontinued     500 mg 100 mL/hr over 60 Minutes Intravenous Every 48 hours 12/26/16 0118 12/26/16 1337   12/25/16 2100  levofloxacin (LEVAQUIN) IVPB 500 mg     500 mg 100 mL/hr over 60 Minutes Intravenous  Once 12/25/16 2058 12/26/16 0026      Subjective:   Jerry Lane seen and examined today. Appears confused today. Unable to answer questions appropriately.   Objective:   Vitals:   12/26/16 1425 12/26/16 2222 12/27/16 0234 12/27/16 0547  BP: (!) 152/93 (!) 143/70 125/64 (!) 141/69  Pulse: 70 (!) 51 (!) 56 96  Resp: Temp: 97.7 F (36.5 C) 98.7 F (37.1 C) 98.2 F (  36.8 C) 97.9 F (36.6 C)  TempSrc: Oral Oral Oral Oral  SpO2: 99% 100% 91% 94%  Weight:      Height:        Intake/Output Summary (Last 24 hours) at 12/27/16 1337 Last data filed at 12/27/16 1610  Gross per 24 hour  Intake           1389.17 ml  Output              325 ml  Net          1064.17 ml   Filed Weights   12/25/16 1925  Weight: 65.8 kg (145 lb)   Exam  General: Well developed, elderly, chronically appearing, NAD  HEENT: NCAT, mucous membranes moist.   Cardiovascular: S1 S2 auscultated, irregular, 2/6 SEM  Respiratory: Clear to auscultation bilaterally with equal chest rise  Abdomen: Soft, nontender, nondistended, + bowel sounds  Extremities: warm dry without cyanosis clubbing or edema  Neuro: awake and alert, not oriented. Cannot fully assess   Data Reviewed: I have personally reviewed following labs and imaging studies  CBC:  Recent Labs Lab 12/25/16 1905 12/25/16 2009 12/26/16 0535 12/27/16 0502  WBC 5.9  --  6.0 5.3  NEUTROABS 4.6  --   --   --   HGB 10.6* 12.9* 10.9* 11.1*  HCT 32.5* 38.0* 34.1* 35.5*  MCV 85.8  --  85.7 86.4  PLT 398  --  409* 463*   Basic Metabolic Panel:  Recent Labs Lab 12/25/16 1905 12/25/16 2009 12/26/16 0535 12/26/16 1917 12/27/16 0502  NA 136 138  --  136 137  K 4.4 4.8  --  5.6* 4.0  CL 106 107  --  110 109  CO2 20*  --   --  14* 18*  GLUCOSE 225* 204*  --  97 107*  BUN 62* 56*  --  48* 41*  CREATININE 3.60* 3.50* 3.22* 3.10* 2.84*  CALCIUM 8.7*  --   --  9.0 9.0  MG  --   --   --  2.0  --    GFR: Estimated Creatinine Clearance: 17.1 mL/min (A) (by C-G formula based on SCr of 2.84 mg/dL (H)). Liver Function Tests:  Recent Labs Lab 12/25/16 1905  AST 20  ALT 18  ALKPHOS 66  BILITOT 0.4  PROT 8.1  ALBUMIN 3.5   No results for input(s): LIPASE, AMYLASE in the last 168 hours. No results for input(s): AMMONIA in the last 168 hours. Coagulation Profile:  Recent Labs Lab 12/25/16 1905  INR 1.16   Cardiac Enzymes: No results for input(s): CKTOTAL, CKMB, CKMBINDEX, TROPONINI in the last 168 hours. BNP (last 3 results) No results for input(s): PROBNP in the last 8760 hours. HbA1C:  Recent Labs  12/26/16 0554  HGBA1C 7.1*     CBG:  Recent Labs Lab 12/26/16 1209 12/26/16 1734 12/26/16 2226 12/27/16 0755 12/27/16 1211  GLUCAP 72 96 91 106* 107*   Lipid Profile:  Recent Labs  12/26/16 0535  CHOL 146  HDL 30*  LDLCALC 102*  TRIG 70  CHOLHDL 4.9   Thyroid Function Tests: No results for input(s): TSH, T4TOTAL, FREET4, T3FREE, THYROIDAB in the last 72 hours. Anemia Panel: No results for input(s): VITAMINB12, FOLATE, FERRITIN, TIBC, IRON, RETICCTPCT in the last 72 hours. Urine analysis:    Component Value Date/Time   COLORURINE YELLOW 12/25/2016 2003   APPEARANCEUR HAZY (A) 12/25/2016 2003   LABSPEC 1.015 12/25/2016 2003   PHURINE 5.0 12/25/2016 2003   GLUCOSEU  NEGATIVE 12/25/2016 2003   HGBUR NEGATIVE 12/25/2016 2003   BILIRUBINUR NEGATIVE 12/25/2016 2003   KETONESUR NEGATIVE 12/25/2016 2003   PROTEINUR 30 (A) 12/25/2016 2003   UROBILINOGEN 0.2 10/11/2006 1026   NITRITE NEGATIVE 12/25/2016 2003   LEUKOCYTESUR MODERATE (A) 12/25/2016 2003   Sepsis Labs: (procalcitonin:4,lacticidven:4)  ) Recent Results (from the past 240 hour(s))  Urine Culture     Status: Abnormal (Preliminary result)   Collection Time: 12/25/16  8:03 PM  Result Value Ref Range Status   Specimen Description URINE, CLEAN CATCH  Final   Special Requests NONE  Final   Culture >=100,000 COLONIES/mL GRAM NEGATIVE RODS (A)  Final   Report Status PENDING  Incomplete  Culture, blood (Routine X 2) w Reflex to ID Panel     Status: None (Preliminary result)   Collection Time: 12/25/16 10:35 PM  Result Value Ref Range Status   Specimen Description RIGHT ANTECUBITAL  Final   Special Requests   Final    BOTTLES DRAWN AEROBIC AND ANAEROBIC Blood Culture adequate volume   Culture NO GROWTH 2 DAYS  Final   Report Status PENDING  Incomplete  Culture, blood (Routine X 2) w Reflex to ID Panel     Status: None (Preliminary result)   Collection Time: 12/25/16 10:49 PM  Result Value Ref Range Status   Specimen Description  BLOOD LEFT HAND  Final   Special Requests   Final    BOTTLES DRAWN AEROBIC ONLY Blood Culture results may not be optimal due to an inadequate volume of blood received in culture bottles   Culture PENDING  Incomplete   Report Status PENDING  Incomplete  MRSA PCR Screening     Status: None   Collection Time: 12/26/16 11:02 PM  Result Value Ref Range Status   MRSA by PCR NEGATIVE NEGATIVE Final    Comment:        The GeneXpert MRSA Assay (FDA approved for NASAL specimens only), is one component of a comprehensive MRSA colonization surveillance program. It is not intended to diagnose MRSA infection nor to guide or monitor treatment for MRSA infections.       Radiology Studies: Ct Chest Wo Contrast  Result Date: 12/25/2016 CLINICAL DATA:  Altered mental status. Interstitial lung disease. Increased weakness to the left side and now has difficulty moving the left side. EXAM: CT CHEST WITHOUT CONTRAST TECHNIQUE: Multidetector CT imaging of the chest was performed following the standard protocol without IV contrast. COMPARISON:  07/08/2004 FINDINGS: Cardiovascular: Normal heart size. No pericardial effusion. Coronary artery and aortic calcifications. Diffusely ectatic thoracic aorta. Ascending aorta measures 3.6 cm diameter. Descending aorta measures 3.2 cm diameter. Mediastinum/Nodes: Esophagus is decompressed. Scattered mediastinal lymph nodes are not pathologically enlarged. No abnormal mediastinal fluid collection or gas. Lungs/Pleura: There is chronic left apical pleural thickening, scarring, and volume loss in the left upper lung with scarring and honeycomb changes. This appearance is similar to the previous study without significant progression. Scarring and emphysematous changes also in the right upper lung as before. No definite evidence of any developing mass or acute consolidation. No pleural effusions. No pneumothorax. Diffuse emphysematous changes throughout the lungs. Upper Abdomen:  Configuration of the liver suggest possible hepatic cirrhosis. Diffuse calcification throughout the pancreas consistent with chronic pancreatitis. No acute process suggested in the visualized upper abdomen. Musculoskeletal: Degenerative changes in the spine. No destructive bone lesions. IMPRESSION: 1. Diffuse emphysematous change throughout the lungs. Chronic bilateral apical scarring, chronic pleural thickening and volume loss in the left upper lung  with scarring and honeycomb changes. No significant change since 2006. No evidence of acute consolidation. 2. A ectatic thoracic aorta. Aortic and coronary artery calcifications. 3. Probable changes of hepatic cirrhosis. Pancreatic calcification consistent with chronic pancreatitis. Aortic Atherosclerosis (ICD10-I70.0) and Emphysema (ICD10-J43.9). Electronically Signed   By: Burman Nieves M.D.   On: 12/25/2016 21:09   Mr Brain Wo Contrast  Result Date: 12/26/2016 CLINICAL DATA:  Ataxia, LEFT-sided weakness. Follow-up possible acute stroke. History of hypertension, diabetes. EXAM: MRI HEAD WITHOUT CONTRAST MRA HEAD WITHOUT CONTRAST TECHNIQUE: Multiplanar, multiecho pulse sequences of the brain and surrounding structures were obtained without intravenous contrast. Angiographic images of the head were obtained using MRA technique without contrast. COMPARISON:  CT HEAD December 25, 2016 FINDINGS: MRI HEAD FINDINGS BRAIN: Patchy reduced diffusion in RIGHT frontal, RIGHT parietal, RIGHT occipital lobe laterally with low ADC values. Chronic microhemorrhage LEFT frontal lobe. Faint mineralization associated with old small pontine and cerebellar infarcts. Old bilateral basal ganglia and thalamus infarcts. Prominent basal ganglia and thalamus perivascular spaces associated with chronic hypertension. Patchy to confluent supratentorial white matter FLAIR T2 hyperintensities. No midline shift, mass effect or masses. Small area LEFT frontal encephalomalacia. VASCULAR: Normal  major intracranial vascular flow voids present at skull base. SKULL AND UPPER CERVICAL SPINE: No abnormal sellar expansion. No suspicious calvarial bone marrow signal. Craniocervical junction maintained. SINUSES/ORBITS: The mastoid air-cells and included paranasal sinuses are well-aerated. The included ocular globes and orbital contents are non-suspicious. OTHER: Patient is edentulous. MRA HEAD FINDINGS ANTERIOR CIRCULATION: Normal flow related enhancement of the included cervical, petrous, cavernous and supraclinoid internal carotid arteries. Patent anterior communicating artery, 2 mm medially directed LEFT A1-2 junction aneurysm. Mild stenosis RIGHT proximal M1 segment. Patent anterior and middle cerebral arteries, including distal segments. No large vessel occlusion, high-grade stenosis . POSTERIOR CIRCULATION: Codominant vertebral artery's, mild stenosis distal LEFT V4 segment. Basilar artery is patent, with normal flow related enhancement of the main branch vessels. Patent posterior cerebral arteries. Robust bilateral posterior communicating arteries present. Mild tandem stenoses bilateral posterior cerebral artery's. No large vessel occlusion, high-grade stenosis,  aneurysm. ANATOMIC VARIANTS: None. Source images and MIP images were reviewed. IMPRESSION: MRI HEAD: 1. Acute patchy RIGHT MCA territory and RIGHT posterior watershed territory infarcts. 2. Old small LEFT frontal lobe/MCA territory infarct. 3. Multiple old small basal ganglia, thalamus, pontine and cerebellar infarcts. 4. Moderate to severe chronic small vessel ischemic disease. MRA HEAD: 1. No emergent large vessel occlusion or flow limiting stenosis. Given above MRI findings, recommend angiographic imaging of the neck to evaluate for proximal stenosis. 2. Mild intracranial atherosclerosis. 3. 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 Electronically Signed   By: Awilda Metro M.D.   On: 12/26/2016 05:08   Dg Chest Portable 1 View  Result Date:  12/25/2016 CLINICAL DATA:  Cough EXAM: PORTABLE CHEST 1 VIEW COMPARISON:  07/13/2016 FINDINGS: Development of multifocal opacities within the lung apices and left base. No pleural effusion. Mild cardiomegaly. Stable slightly enlarged cardiomediastinal silhouette with tortuosity of the aortic arch. No pneumothorax. Possible left hilar density IMPRESSION: New multifocal opacities within the apical lungs and left base which may reflect multifocal pneumonia. Possible left hilar opacity, CT chest suggested for further evaluation. Electronically Signed   By: Jasmine Pang M.D.   On: 12/25/2016 20:07   Mr Maxine Glenn Head/brain ZO Cm  Result Date: 12/26/2016 CLINICAL DATA:  Ataxia, LEFT-sided weakness. Follow-up possible acute stroke. History of hypertension, diabetes. EXAM: MRI HEAD WITHOUT CONTRAST MRA HEAD WITHOUT CONTRAST TECHNIQUE: Multiplanar, multiecho pulse sequences of  the brain and surrounding structures were obtained without intravenous contrast. Angiographic images of the head were obtained using MRA technique without contrast. COMPARISON:  CT HEAD December 25, 2016 FINDINGS: MRI HEAD FINDINGS BRAIN: Patchy reduced diffusion in RIGHT frontal, RIGHT parietal, RIGHT occipital lobe laterally with low ADC values. Chronic microhemorrhage LEFT frontal lobe. Faint mineralization associated with old small pontine and cerebellar infarcts. Old bilateral basal ganglia and thalamus infarcts. Prominent basal ganglia and thalamus perivascular spaces associated with chronic hypertension. Patchy to confluent supratentorial white matter FLAIR T2 hyperintensities. No midline shift, mass effect or masses. Small area LEFT frontal encephalomalacia. VASCULAR: Normal major intracranial vascular flow voids present at skull base. SKULL AND UPPER CERVICAL SPINE: No abnormal sellar expansion. No suspicious calvarial bone marrow signal. Craniocervical junction maintained. SINUSES/ORBITS: The mastoid air-cells and included paranasal sinuses  are well-aerated. The included ocular globes and orbital contents are non-suspicious. OTHER: Patient is edentulous. MRA HEAD FINDINGS ANTERIOR CIRCULATION: Normal flow related enhancement of the included cervical, petrous, cavernous and supraclinoid internal carotid arteries. Patent anterior communicating artery, 2 mm medially directed LEFT A1-2 junction aneurysm. Mild stenosis RIGHT proximal M1 segment. Patent anterior and middle cerebral arteries, including distal segments. No large vessel occlusion, high-grade stenosis . POSTERIOR CIRCULATION: Codominant vertebral artery's, mild stenosis distal LEFT V4 segment. Basilar artery is patent, with normal flow related enhancement of the main branch vessels. Patent posterior cerebral arteries. Robust bilateral posterior communicating arteries present. Mild tandem stenoses bilateral posterior cerebral artery's. No large vessel occlusion, high-grade stenosis,  aneurysm. ANATOMIC VARIANTS: None. Source images and MIP images were reviewed. IMPRESSION: MRI HEAD: 1. Acute patchy RIGHT MCA territory and RIGHT posterior watershed territory infarcts. 2. Old small LEFT frontal lobe/MCA territory infarct. 3. Multiple old small basal ganglia, thalamus, pontine and cerebellar infarcts. 4. Moderate to severe chronic small vessel ischemic disease. MRA HEAD: 1. No emergent large vessel occlusion or flow limiting stenosis. Given above MRI findings, recommend angiographic imaging of the neck to evaluate for proximal stenosis. 2. Mild intracranial atherosclerosis. 3. 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 Electronically Signed   By: Awilda Metro M.D.   On: 12/26/2016 05:08   Ct Head Code Stroke Wo Contrast  Result Date: 12/25/2016 CLINICAL DATA:  Code stroke. Initial evaluation for increase left-sided weakness for 4 hours. EXAM: CT HEAD WITHOUT CONTRAST TECHNIQUE: Contiguous axial images were obtained from the base of the skull through the vertex without intravenous contrast.  COMPARISON:  Prior CT from 07/13/2016. FINDINGS: Brain: Advanced age-related cerebral atrophy with chronic microvascular ischemic disease. Encephalomalacia within the anterior left frontal lobe compatible with remote left MCA territory infarct. Multiple remote lacunar infarcts present within the bilateral basal ganglia and thalami as well as the brainstem. Few small remote right cerebellar infarcts. There is question of a tiny approximate 1 cm hypodensity involving the cortical gray matter of the posterior left frontal lobe, precentral gyrus/ motor strip, which may reflect an evolving tiny acute ischemic infarct (series 2, image 29). Finding not entirely certain given the small size. No other definite acute large vessel territory infarct. No acute intracranial hemorrhage. No mass lesion or midline shift. No mass effect. No hydrocephalus. No extra-axial fluid collection. Vascular: No worrisome hyperdense vessel. Prominent vascular calcifications noted within the carotid siphons and distal vertebral artery's. Skull: Scalp soft tissues and calvarium within normal limits. Sinuses/Orbits: Globes and orbital soft tissues within normal limits. Paranasal sinuses are clear. Small right mastoid effusion noted. Other: None. ASPECTS [facility] Stroke Program Early CT Score) - Ganglionic level infarction (  caudate, lentiform nuclei, internal capsule, insula, M1-M3 cortex): 7 - Supraganglionic infarction (M4-M6 cortex): 3 Total score (0-10 with 10 being normal): 10 IMPRESSION: 1. Question tiny approximate 1 cm evolving hypodensity at the posterior right frontal lobe, precentral gyrus, which may reflect a tiny acute cortical ischemic infarct. Finding could be further assessed with dedicated MRI as desired. No acute intracranial hemorrhage. 2. ASPECTS is 10 3. Otherwise stable atrophy and chronic microvascular ischemic disease with multiple remote infarcts as above. Critical Value/emergent results were called by telephone at the time  of interpretation on 12/25/2016 at 7:24 pm to Dr. Bethann Berkshire , who verbally acknowledged these results. Electronically Signed   By: Rise Mu M.D.   On: 12/25/2016 19:33     Scheduled Meds: . aspirin  300 mg Rectal Daily   Or  . aspirin  325 mg Oral Daily  . atorvastatin  40 mg Oral q1800  . clopidogrel  75 mg Oral Daily  . enoxaparin (LOVENOX) injection  30 mg Subcutaneous Q24H  . insulin aspart  0-9 Units Subcutaneous TID WC   Continuous Infusions: . sodium chloride 50 mL/hr at 12/27/16 0220     LOS: 2 days   Time Spent in minutes   45 minutes  Nathanyel Defenbaugh D.O. on 12/27/2016 at 1:37 PM  Between 7am to 7pm - Pager - [phone]  After 7pm go to [email or web address] - password [email or web address]  And look for the night coverage person covering for me after hours  [facility] Office  [phone]

## 2016-12-27 NOTE — Evaluation (Signed)
Clinical/Bedside Swallow Evaluation Patient Details  Name: Jerry Lane MRN: [id number] Date of Birth: 30-Oct-1927  Today's Date: 12/27/2016 Time: SLP Start Time (ACUTE ONLY): 1405 SLP Stop Time (ACUTE ONLY): 1420 SLP Time Calculation (min) (ACUTE ONLY): 15 min  Past Medical History:  Past Medical History:  Diagnosis Date  . Arthritis   . Diabetes mellitus    Type II  . Hypertension   . Stroke [facility])    no residual weakness   Past Surgical History:  Past Surgical History:  Procedure Laterality Date  . TIBIA FRACTURE SURGERY  1987   Right, [facility]?   HPI:  Pt is an 82 y/o male admitted secondary to L sided weakness. Pt found to have an acute ischemic stroke. PMH including but not limited to DM, HTN, CKD and hx of CVA with residual L sided weakness. MRI 12/26/16 revealed acute patchy RIGHT MCA territory and RIGHT posterior watershed territory infarcts. Old small LEFT frontal lobe/MCA territory infarct, multiple old small basal ganglia, thalamus, pontine and cerebellar infarcts. Pt having difficulty masticating solids, with wet vocal quality despite passing RN swallow screen at [facility].    Assessment / Plan / Recommendation Clinical Impression  Patient presents with wet vocal quality and immediate cough with volitional dry swallow, suggestive of aspiration of secretions. With sips of thin liquids, pt presents with multiple swallows, immediate coughing, increased wet vocal quality, suggestive of reduced airway protection. Suspect delayed swallow initiation. Overt signs of aspiration reduced with single sips of nectar thick liquid, although pt did have one instance of immediate throat clearing following multiple sips of nectar. With regular and soft solids, pt demonstrates prolonged mastication, decreased lingual manipulation and delayed oral transit, with moderate diffuse residue remaining after the swallow. Oral phase prolonged, however appears grossly functional for purees. Recommend downgrade to dys  1 with nectar thick liquids, meds crushed in puree. SLP will follow up next date for tolerance, MBS may be warranted.  SLP Visit Diagnosis: Dysphagia, oropharyngeal phase (R13.12)    Aspiration Risk  Moderate aspiration risk    Diet Recommendation Dysphagia 1 (Puree);Nectar-thick liquid   Liquid Administration via: Cup Medication Administration: Crushed with puree Supervision: Full supervision/cueing for compensatory strategies;Staff to assist with self feeding Compensations: Slow rate;Small sips/bites;Minimize environmental distractions Postural Changes: Seated upright at 90 degrees    Other  Recommendations Oral Care Recommendations: Oral care BID   Follow up Recommendations Skilled Nursing facility      Frequency and Duration min 2x/week  2 weeks       Prognosis Prognosis for Safe Diet Advancement: Good Barriers to Reach Goals: Cognitive deficits;Severity of deficits      Swallow Study   General Date of Onset: 12/25/16 HPI: Pt is an 83 y/o male admitted secondary to L sided weakness. Pt found to have an acute ischemic stroke. PMH including but not limited to DM, HTN, CKD and hx of CVA with residual L sided weakness. MRI 12/26/16 revealed acute patchy RIGHT MCA territory and RIGHT posterior watershed territory infarcts. Old small LEFT frontal lobe/MCA territory infarct, multiple old small basal ganglia, thalamus, pontine and cerebellar infarcts. Pt having difficulty masticating solids, with wet vocal quality despite passing RN swallow screen at [facility].  Type of Study: Bedside Swallow Evaluation Previous Swallow Assessment: none in chart Diet Prior to this Study: Regular;Thin liquids Temperature Spikes Noted: No Respiratory Status: Room air History of Recent Intubation: No Behavior/Cognition: Alert;Cooperative Oral Cavity Assessment: Excessive secretions Oral Care Completed by SLP: No Oral Cavity - Dentition: Edentulous Vision: Functional  for self-feeding Self-Feeding  Abilities: Able to feed self;Needs assist;Needs set up Patient Positioning: Upright in bed Baseline Vocal Quality: Wet;Hoarse Volitional Cough: Weak Volitional Swallow: Able to elicit    Oral/Motor/Sensory Function Overall Oral Motor/Sensory Function: Generalized oral weakness Lingual Strength: Reduced   Ice Chips Ice chips: Not tested   Thin Liquid Thin Liquid: Impaired Presentation: Cup Oral Phase Impairments: Reduced lingual movement/coordination Oral Phase Functional Implications: Prolonged oral transit;Oral holding Pharyngeal  Phase Impairments: Suspected delayed Swallow;Multiple swallows;Wet Vocal Quality;Throat Clearing - Immediate;Cough - Immediate    Nectar Thick Nectar Thick Liquid: Impaired Presentation: Cup Oral Phase Impairments: Reduced lingual movement/coordination Oral phase functional implications: Oral holding Pharyngeal Phase Impairments: Throat Clearing - Immediate   Honey Thick Honey Thick Liquid: Not tested   Puree Puree: Impaired Oral Phase Impairments: Reduced lingual movement/coordination Oral Phase Functional Implications: Prolonged oral transit   Solid   GO   Solid: Impaired Oral Phase Impairments: Reduced lingual movement/coordination;Impaired mastication Oral Phase Functional Implications: Prolonged oral transit;Impaired mastication;Oral residue        Arlana Lindau 12/27/2016,2:48 PM Rondel Baton, MS, CCC-SLP Speech-Language Pathologist [phone]

## 2016-12-27 NOTE — Progress Notes (Signed)
SLP Cancellation Note  Patient Details Name: Jerry Lane MRN: [id number] DOB: May 16, 1927   Cancelled treatment:       Reason Eval/Treat Not Completed: Patient declined, no reason specified. Pt awake but keeping eyes closed. Response to questions: "Please leave me alone." Will reattempt cognitive evaluation as pt able to participate.  Rondel Baton, [place], CCC-SLP Speech-Language Pathologist [phone]   Arlana Lindau 12/27/2016, 10:59 AM

## 2016-12-27 NOTE — Progress Notes (Signed)
Pharmacy Antibiotic Note  Jerry Lane is a 83 y.o. male admitted on 12/25/2016 with acute CVA and UTI.  Pharmacy has been consulted for ceftriaxone dosing. Patient is currently afebrile, WBC wnl. Admitted with AKI on CKD now resolved.  Plan: Ceftriaxone 1 gram q24h Follow clinical status, c/s, length of therapy/de-escalation plans   Height:  (172.7 cm) Weight: 145 lb (65.8 kg) IBW/kg (Calculated) : 68.4  Temp (24hrs), Avg:98.1 F (36.7 C), Min:97.7 F (36.5 C), Max:98.7 F (37.1 C)   Recent Labs Lab 12/25/16 1905 12/25/16 2009 12/26/16 0535 12/26/16 1917 12/27/16 0502  WBC 5.9  --  6.0  --  5.3  CREATININE 3.60* 3.50* 3.22* 3.10* 2.84*    Estimated Creatinine Clearance: 17.1 mL/min (A) (by C-G formula based on SCr of 2.84 mg/dL (H)).    No Known Allergies  Antimicrobials this admission: Levaquin 9/21>>9/22 (one dose) Ceftriaxone 9/23>>  Microbiology results: 9/21 Urine cx>> GNR (>100K colonies) 9/21 blood cx x 2>>ngtd 9/22 MRSA PCR>>neg  Thank you for allowing pharmacy to be a part of this patient's care.    Al Corpus, PharmD PGY1 Pharmacy Resident Phone: [phone] After 3:30PM please call Main Pharmacy [phone] 12/27/2016 1:45 PM

## 2016-12-27 NOTE — ED Notes (Signed)
Entering chart to find patient's family contact information.

## 2016-12-27 NOTE — Progress Notes (Signed)
  Echocardiogram 2D Echocardiogram has been performed.  Nikhita Mentzel G Carah Barrientes 12/27/2016, 1:46 PM

## 2016-12-28 DIAGNOSIS — E785 Hyperlipidemia, unspecified: Secondary | ICD-10-CM

## 2016-12-28 LAB — URINE CULTURE: Culture: >=100000 — AB

## 2016-12-28 LAB — VAS US CAROTID
LCCADDIAS: -17 cm/s
LCCADSYS: -88 cm/s
LCCAPDIAS: 13 cm/s
LEFT ECA DIAS: -14 cm/s
LEFT VERTEBRAL DIAS: 7 cm/s
LICADDIAS: -16 cm/s
LICADSYS: -73 cm/s
LICAPDIAS: -32 cm/s
LICAPSYS: -115 cm/s
Left CCA prox sys: 78 cm/s
RCCAPSYS: 94 cm/s
RIGHT ECA DIAS: -6 cm/s
RIGHT VERTEBRAL DIAS: 6 cm/s
Right CCA prox dias: 17 cm/s
Right cca dist sys: -128 cm/s

## 2016-12-28 LAB — BASIC METABOLIC PANEL
ANION GAP: 9 (ref 5–15)
BUN: 37 mg/dL — ABNORMAL HIGH (ref 6–20)
CO2: 18 mmol/L — AB (ref 22–32)
Calcium: 8.8 mg/dL — ABNORMAL LOW (ref 8.9–10.3)
Chloride: 111 mmol/L (ref 101–111)
Creatinine, Ser: 2.61 mg/dL — ABNORMAL HIGH (ref 0.61–1.24)
GFR calc Af Amer: 24 mL/min — ABNORMAL LOW (ref >60)
GFR calc non Af Amer: 21 mL/min — ABNORMAL LOW (ref >60)
GLUCOSE: 99 mg/dL (ref 65–99)
POTASSIUM: 5.4 mmol/L — AB (ref 3.5–5.1)
Sodium: 138 mmol/L (ref 135–145)

## 2016-12-28 LAB — CBC
HEMATOCRIT: 34.9 % — AB (ref 39.0–52.0)
Hemoglobin: 10.8 g/dL — ABNORMAL LOW (ref 13.0–17.0)
MCH: 26.9 pg (ref 26.0–34.0)
MCHC: 30.9 g/dL (ref 30.0–36.0)
MCV: 86.8 fL (ref 78.0–100.0)
Platelets: 370 10*3/uL (ref 150–400)
RBC: 4.02 MIL/uL — ABNORMAL LOW (ref 4.22–5.81)
RDW: 14.8 % (ref 11.5–15.5)
WBC: 4.6 10*3/uL (ref 4.0–10.5)

## 2016-12-28 LAB — GLUCOSE, CAPILLARY
GLUCOSE-CAPILLARY: 94 mg/dL (ref 65–99)
GLUCOSE-CAPILLARY: 160 mg/dL — AB (ref 65–99)
Glucose-Capillary: 192 mg/dL — ABNORMAL HIGH (ref 65–99)
Glucose-Capillary: 117 mg/dL — ABNORMAL HIGH (ref 65–99)

## 2016-12-28 MED ORDER — ADULT MULTIVITAMIN W/MINERALS CH
1.0000 | ORAL_TABLET | Freq: Every day | ORAL | Status: DC
  Administered 2016-12-28 – 2016-12-30 (×3): 1 via ORAL
  Filled 2016-12-28 (×3): qty 1

## 2016-12-28 MED ORDER — STROKE: EARLY STAGES OF RECOVERY BOOK
Freq: Once | Status: DC
  Filled 2016-12-28: qty 1

## 2016-12-28 MED ORDER — ENSURE ENLIVE PO LIQD
237.0000 mL | Freq: Two times a day (BID) | ORAL | Status: DC
  Administered 2016-12-29: 237 mL via ORAL

## 2016-12-28 MED ORDER — STROKE: EARLY STAGES OF RECOVERY BOOK
Freq: Once | Status: AC
  Administered 2016-12-28: 22:00:00
  Filled 2016-12-28: qty 1

## 2016-12-28 NOTE — Progress Notes (Signed)
Initial Nutrition Assessment  DOCUMENTATION CODES:   Severe malnutrition in context of chronic illness  INTERVENTION:   -MVI daily -Ensure Enlive po BID, each supplement provides 350 kcal and 20 grams of protein  NUTRITION DIAGNOSIS:   Malnutrition (Severe) related to chronic illness (CVA) as evidenced by moderate depletion of body fat, severe depletion of body fat, moderate depletions of muscle mass, severe depletion of muscle mass.  GOAL:   Patient will meet greater than or equal to 90% of their needs  MONITOR:   PO intake, Supplement acceptance, Labs, Weight trends, Skin, I & O's  REASON FOR ASSESSMENT:   Low Braden    ASSESSMENT:   Jerry Lane  is a 83 y.o. male, With history of diabetes, hypertension, stroke with residual weakness in left arm who came to hospital after patient was found to be weak on left side around 3:30 PM. Patient was unable to ambulate due to weakness in his left leg. He denies slurred speech. No passing out. No seizure-like activity.  Pt admitted with lt sided weakness and acute CVA.   Pt unable to participate in interview. Noted breakfast tray at bedside table- pt consumed 100%.   Reviewed wt hx; noted hx of slight wt gain.   Nutrition-Focused physical exam completed. Findings are moderate to severe fat depletion, moderate to severe muscle depletion, and no edema.   Labs reviewed: K: 5.4, CBGS: 94-192 (inpatient glycemic control orders 0-9 units insulin aspart TID).   Diet Order:  DIET - DYS 1 Room service appropriate? Yes; Fluid consistency: Nectar Thick  Skin:  Wound (see comment) (st 2 coccyx)  Last BM:  PTA  Height:   Ht Readings from Last 1 Encounters:  12/25/16  (1.727 m)    Weight:   Wt Readings from Last 1 Encounters:  12/25/16 145 lb (65.8 kg)    Ideal Body Weight:  70 kg  BMI:  Body mass index is 22.05 kg/m.  Estimated Nutritional Needs:   Kcal:  1650-1850  Protein:  75-90 grams  Fluid:  > 1.6  L  EDUCATION NEEDS:   Education needs no appropriate at this time  Bama Hanselman A. Mayford Knife, RD, LDN, CDE Pager: [phone] After hours Pager: [phone]

## 2016-12-28 NOTE — Clinical Social Work Note (Signed)
Clinical Social Work Assessment  Patient Details  Name: Jerry Lane MRN: [id number] Date of Birth: 1927-05-14  Date of referral:  12/28/16               Reason for consult:  Facility Placement                Permission sought to share information with:  Facility Medical sales representative, Family Supports Permission granted to share information::  No  Name::     Sports administrator::  SNFs  Relationship::  Sister  Contact Information:     Housing/Transportation Living arrangements for the past 2 months:  Assisted Dealer of Information:  Facility, Other (Comment Required) Patient Interpreter Needed:  None Criminal Activity/Legal Involvement Pertinent to Current Situation/Hospitalization:  No - Comment as needed Significant Relationships:  Siblings, Other Family Members Lives with:  Facility Resident Do you feel safe going back to the place where you live?  No Need for family participation in patient care:  Yes (Comment)  Care giving concerns:  CSW received consult for possible SNF placement at time of discharge. CSW spoke with patient's sister and niece regarding PT recommendation of SNF placement at time of discharge. Patient's sister reported that patient resides at [facility]. They are unable to care for him at this level unless he can walk. Patient's sister expressed understanding of PT recommendation and is agreeable to SNF placement at time of discharge. CSW to continue to follow and assist with discharge planning needs.   Social Worker assessment / plan:  CSW spoke with patient's sister concerning possibility of rehab at [facility] before returning home.  Employment status:  Retired Database administrator, [place] PT Recommendations:  Skilled Nursing Facility Information / Referral to community resources:  Skilled Nursing Facility  Patient/Family's Response to care:  Patient's sister recognizes need for rehab before returning to ALF and is  agreeable to a SNF in [place]. Patient reported preference for [facility].  Patient/Family's Understanding of and Emotional Response to Diagnosis, Current Treatment, and Prognosis:  Patient/family is realistic regarding therapy needs and expressed being hopeful for SNF placement. Patient's sister expressed understanding of CSW role and discharge process as well as medical condition. No questions/concerns about plan or treatment.    Emotional Assessment Appearance:  Appears stated age Attitude/Demeanor/Rapport:  Unable to Assess Affect (typically observed):  Unable to Assess Orientation:  Oriented to Self Alcohol / Substance use:  Not Applicable Psych involvement (Current and /or in the community):  No (Comment)  Discharge Needs  Concerns to be addressed:  Care Coordination Readmission within the last 30 days:  No Current discharge risk:  Dependent with Mobility Barriers to Discharge:  Continued Medical Work up   Jerry Lane, LCSWA 12/28/2016, 12:29 PM

## 2016-12-28 NOTE — Progress Notes (Signed)
PROGRESS NOTE    Jerry Lane  UJW:[id number] DOB: [birth year] DOA: 12/25/2016 PCP: Mirna Mires, MD   Chief Complaint  Patient presents with  . Code Stroke    Brief Narrative:  HPI on 12/25/2016 by Dr. Mauro Kaufmann [name]  is a 83 y.o. male, With history of diabetes, hypertension, stroke with residual weakness in left arm who came to hospital after patient was found to be weak on left side around 3:30 PM. Patient was unable to ambulate due to weakness in his left leg. He denies slurred speech. No passing out. No seizure-like activity. Patient was seen in the ED and code stroke was called, patient was evaluated by tele neurology, and no TPA was given. CT head showed Question tiny approximate 1 cm evolving hypodensity at the posterior right frontal lobe, precentral gyrus, which may reflect a tiny acute cortical ischemic infarct. MRI was recommended. Patient denies chest pain, no shortness of breath. Denies dysuria. No abdominal pain. No nausea vomiting or diarrhea. Patient also had chest x-ray which showed questionable pneumonia, CT chest was performed which did not show pneumonia. He had abnormal UA with too numerous to count WBCs. Started on IV Levaquin. Blood and urine cultures have been obtained. Assessment & Plan   Acute CVA -CT head: Question tiny approximate 1 cm evolving hypodensity at the posterior right frontal lobe, precentral gyrus, may reflect tiny acute cortical ischemic infarct -MRI brain: Acute patchy right MCA territory and right posterior watershed territory infarcts. Old small left frontal lobe/MCH are probably infarct. -MRA head: No emergent large vessel occlusion or flow-limiting stenosis. -Carotid doppler: Right 60-79% ICA stenosis. Left 1-39% ICA stenosis. -Echocardigoram pending -LDL 102, Hemoglobin A1c 7.1 -Neurology consulted and appreciated. Discussed with Dr. Roda Shutters- patient may follow up with vascular as an outpatient for ICA stenosis.   -Continue aspirin -Not  sure why patient is not on statin from his prior strokes, started atorvastatin 40 mg  -Follow up with [facility] in 6 weeks.  Ecoli UTI -UA showed rare bacteria, TNTC WBC, moderate leukocytes, negative nitrites -Currently afebrile and no leukocytosis -urine culture >100K Ecoli -patient was initially placed on levaquin- however discontinued -Continue ceftriaxone   Acute metabolic encephalopathy -suspect secondary to UTI vs Acute CVA -improving, continue to treat UTI  Acute kidney injury on chronic kidney disease stage IV -Baseline creatinine approximately 2.7-3.2 -Creatinine on admission 3.6, currently 2.61 -Continue to monitor BMP  Essential hypertension -Hold amlodipine, allow permissive hypertension -BP normotensive   Diabetes mellitus, type II -Patient uses Tradjenta at home, continue to hold -Continue insulin sliding scale CBG monitoring -Hemoglobin A1c 7.1  DVT Prophylaxis  Lovenox  Code Status: Full  Family Communication: None at bedside  Disposition Plan: Admitted, pending SNF at discharge- possibly 9/25  Consultants Neurology  Procedures  Carotid Doppler  Antibiotics   Anti-infectives    Start     Dose/Rate Route Frequency Ordered Stop   12/27/16 2300  levofloxacin (LEVAQUIN) IVPB 500 mg  Status:  Discontinued     500 mg 100 mL/hr over 60 Minutes Intravenous Every 48 hours 12/26/16 0118 12/26/16 1337   12/27/16 1430  cefTRIAXone (ROCEPHIN) 1 g in dextrose 5 % 50 mL IVPB     1 g 100 mL/hr over 30 Minutes Intravenous Every 24 hours 12/27/16 1343     12/27/16 1345  cefTRIAXone (ROCEPHIN) 1 g in dextrose 5 % 50 mL IVPB  Status:  Discontinued     1 g 100 mL/hr over 30 Minutes Intravenous Every 24 hours 12/27/16  1343 12/27/16 1343   12/25/16 2100  levofloxacin (LEVAQUIN) IVPB 500 mg     500 mg 100 mL/hr over 60 Minutes Intravenous  Once 12/25/16 2058 12/26/16 0026      Subjective:   Jerry Lane seen and examined today. Has no complaints today. Denies chest  pain, shortness of breath, abdominal pain, N/V/C/D. Would like to be left alone.  Objective:   Vitals:   12/27/16 0547 12/27/16 1729 12/27/16 2201 12/28/16 0509  BP: (!) 141/69 137/62 133/76 136/78  Pulse: 96 (!) 57 61 68  Resp: Temp: 97.9 F (36.6 C) 97.8 F (36.6 C) 97.7 F (36.5 C) 97.8 F (36.6 C)  TempSrc: Oral Oral Oral   SpO2: 94% 100% 99%   Weight:      Height:        Intake/Output Summary (Last 24 hours) at 12/28/16 1424 Last data filed at 12/28/16 0700  Gross per 24 hour  Intake           1277.5 ml  Output                0 ml  Net           1277.5 ml   Filed Weights   12/25/16 1925  Weight: 65.8 kg (145 lb)   Exam  General: Well developed, well nourished, NAD, appears stated age  HEENT: NCAT, mucous membranes moist.   Cardiovascular: S1 S2 auscultated, irregular, 2/6 SEM  Respiratory: Clear to auscultation bilaterally with equal chest rise  Abdomen: Soft, nontender, nondistended, + bowel sounds  Extremities: warm dry without cyanosis clubbing or edema  Neuro: AAOx3, not very interactive today  Psych: Appropriate   Data Reviewed: I have personally reviewed following labs and imaging studies  CBC:  Recent Labs Lab 12/25/16 1905 12/25/16 2009 12/26/16 0535 12/27/16 0502 12/28/16 0604  WBC 5.9  --  6.0 5.3 4.6  NEUTROABS 4.6  --   --   --   --   HGB 10.6* 12.9* 10.9* 11.1* 10.8*  HCT 32.5* 38.0* 34.1* 35.5* 34.9*  MCV 85.8  --  85.7 86.4 86.8  PLT 398  --  409* 463* 370   Basic Metabolic Panel:  Recent Labs Lab 12/25/16 1905 12/25/16 2009 12/26/16 0535 12/26/16 1917 12/27/16 0502 12/28/16 0604  NA 136 138  --  136 137 138  K 4.4 4.8  --  5.6* 4.0 5.4*  CL 106 107  --  110 109 111  CO2 20*  --   --  14* 18* 18*  GLUCOSE 225* 204*  --  97 107* 99  BUN 62* 56*  --  48* 41* 37*  CREATININE 3.60* 3.50* 3.22* 3.10* 2.84* 2.61*  CALCIUM 8.7*  --   --  9.0 9.0 8.8*  MG  --   --   --  2.0  --   --    GFR: Estimated  Creatinine Clearance: 18.6 mL/min (A) (by C-G formula based on SCr of 2.61 mg/dL (H)). Liver Function Tests:  Recent Labs Lab 12/25/16 1905  AST 20  ALT 18  ALKPHOS 66  BILITOT 0.4  PROT 8.1  ALBUMIN 3.5   No results for input(s): LIPASE, AMYLASE in the last 168 hours. No results for input(s): AMMONIA in the last 168 hours. Coagulation Profile:  Recent Labs Lab 12/25/16 1905  INR 1.16   Cardiac Enzymes: No results for input(s): CKTOTAL, CKMB, CKMBINDEX, TROPONINI in the last 168 hours. BNP (last 3 results) No results  for input(s): PROBNP in the last 8760 hours. HbA1C:  Recent Labs  12/26/16 0554  HGBA1C 7.1*   CBG:  Recent Labs Lab 12/27/16 1211 12/27/16 1721 12/27/16 2200 12/28/16 0818 12/28/16 1230  GLUCAP 107* 152* 160* 94 192*   Lipid Profile:  Recent Labs  12/26/16 0535  CHOL 146  HDL 30*  LDLCALC 102*  TRIG 70  CHOLHDL 4.9   Thyroid Function Tests: No results for input(s): TSH, T4TOTAL, FREET4, T3FREE, THYROIDAB in the last 72 hours. Anemia Panel: No results for input(s): VITAMINB12, FOLATE, FERRITIN, TIBC, IRON, RETICCTPCT in the last 72 hours. Urine analysis:    Component Value Date/Time   COLORURINE YELLOW 12/25/2016 2003   APPEARANCEUR HAZY (A) 12/25/2016 2003   LABSPEC 1.015 12/25/2016 2003   PHURINE 5.0 12/25/2016 2003   GLUCOSEU NEGATIVE 12/25/2016 2003   HGBUR NEGATIVE 12/25/2016 2003   BILIRUBINUR NEGATIVE 12/25/2016 2003   KETONESUR NEGATIVE 12/25/2016 2003   PROTEINUR 30 (A) 12/25/2016 2003   UROBILINOGEN 0.2 10/11/2006 1026   NITRITE NEGATIVE 12/25/2016 2003   LEUKOCYTESUR MODERATE (A) 12/25/2016 2003   Sepsis Labs: (procalcitonin:4,lacticidven:4)  ) Recent Results (from the past 240 hour(s))  Urine Culture     Status: Abnormal   Collection Time: 12/25/16  8:03 PM  Result Value Ref Range Status   Specimen Description URINE, CLEAN CATCH  Final   Special Requests NONE  Final   Culture >=100,000 COLONIES/mL  ESCHERICHIA COLI (A)  Final   Report Status 12/28/2016 FINAL  Final   Organism ID, Bacteria ESCHERICHIA COLI (A)  Final      Susceptibility   Escherichia coli - MIC*    AMPICILLIN <=2 SENSITIVE Sensitive     CEFAZOLIN <=4 SENSITIVE Sensitive     CEFTRIAXONE <=1 SENSITIVE Sensitive     CIPROFLOXACIN <=0.25 SENSITIVE Sensitive     GENTAMICIN <=1 SENSITIVE Sensitive     IMIPENEM <=0.25 SENSITIVE Sensitive     NITROFURANTOIN <=16 SENSITIVE Sensitive     TRIMETH/SULFA <=20 SENSITIVE Sensitive     AMPICILLIN/SULBACTAM <=2 SENSITIVE Sensitive     PIP/TAZO <=4 SENSITIVE Sensitive     Extended ESBL NEGATIVE Sensitive     * >=100,000 COLONIES/mL ESCHERICHIA COLI  Culture, blood (Routine X 2) w Reflex to ID Panel     Status: None (Preliminary result)   Collection Time: 12/25/16 10:35 PM  Result Value Ref Range Status   Specimen Description RIGHT ANTECUBITAL  Final   Special Requests   Final    BOTTLES DRAWN AEROBIC AND ANAEROBIC Blood Culture adequate volume   Culture NO GROWTH 3 DAYS  Final   Report Status PENDING  Incomplete  Culture, blood (Routine X 2) w Reflex to ID Panel     Status: None (Preliminary result)   Collection Time: 12/25/16 10:49 PM  Result Value Ref Range Status   Specimen Description BLOOD LEFT HAND  Final   Special Requests   Final    BOTTLES DRAWN AEROBIC ONLY Blood Culture results may not be optimal due to an inadequate volume of blood received in culture bottles   Culture NO GROWTH 3 DAYS  Final   Report Status PENDING  Incomplete  MRSA PCR Screening     Status: None   Collection Time: 12/26/16 11:02 PM  Result Value Ref Range Status   MRSA by PCR NEGATIVE NEGATIVE Final    Comment:        The GeneXpert MRSA Assay (FDA approved for NASAL specimens only), is one component of a comprehensive MRSA colonization  surveillance program. It is not intended to diagnose MRSA infection nor to guide or monitor treatment for MRSA infections.       Radiology  Studies: No results found.   Scheduled Meds: . aspirin  300 mg Rectal Daily   Or  . aspirin  325 mg Oral Daily  . atorvastatin  40 mg Oral q1800  . clopidogrel  75 mg Oral Daily  . enoxaparin (LOVENOX) injection  30 mg Subcutaneous Q24H  . insulin aspart  0-9 Units Subcutaneous TID WC   Continuous Infusions: . sodium chloride 50 mL/hr at 12/27/16 2207  . cefTRIAXone (ROCEPHIN)  IV Stopped (12/27/16 1714)     LOS: 3 days   Time Spent in minutes   45 minutes  Tamario Heal D.O. on 12/28/2016 at 2:24 PM  Between 7am to 7pm - Pager - [phone]  After 7pm go to [email or web address] - password [email or web address]  And look for the night coverage person covering for me after hours  [facility] Office  [phone]

## 2016-12-28 NOTE — [place] FL2 (Signed)
[place] MEDICAID FL2 LEVEL OF CARE SCREENING TOOL     IDENTIFICATION  Patient Name: Jerry Lane Birthdate: 1927/06/17 Sex: male Admission Date (Current Location): 12/25/2016  [facility] and [place] Number:  Producer, television/film/video and Address:  [facility], [address], [place], [place] [address]      Provider Number: [id number]  Attending Physician Name and Address:  Edsel Petrin, DO  Relative Name and Phone Number:  Marland Kitchen [phone]    Current Level of Care: Hospital Recommended Level of Care: Skilled Nursing Facility Prior Approval Number:    Date Approved/Denied:   PASRR Number: [id number]  Discharge Plan: SNF    Current Diagnoses: Patient Active Problem List   Diagnosis Date Noted  . Pressure injury of skin 12/27/2016  . Carotid stenosis, symptomatic, with infarction (HCC)   . Hyperlipidemia   . Stroke (cerebrum) (HCC) 12/25/2016  . Anemia 01/09/2016  . CKD (chronic kidney disease) stage 3, GFR 30-59 ml/min 01/09/2016  . Hyperkalemia 10/20/2010  . Renal failure 10/20/2010  . DM (diabetes mellitus), type 2 with renal complications (HCC) 10/20/2010  . HTN (hypertension) 10/20/2010  . Cerebrovascular disease 10/20/2010    Orientation RESPIRATION BLADDER Height & Weight     Self, Place  Normal Incontinent Weight: 65.8 kg (145 lb) Height:   (172.7 cm)  BEHAVIORAL SYMPTOMS/MOOD NEUROLOGICAL BOWEL NUTRITION STATUS      Continent Diet (Please see DC Summary)  AMBULATORY STATUS COMMUNICATION OF NEEDS Skin   Extensive Assist Verbally PU Stage and Appropriate Care (Stage IIpressure injury on coccyx)                       Personal Care Assistance Level of Assistance  Bathing, Feeding, Dressing Bathing Assistance: Limited assistance Feeding assistance: Limited assistance Dressing Assistance: Limited assistance     Functional Limitations Info             SPECIAL CARE FACTORS FREQUENCY  PT (By licensed  PT), OT (By licensed OT), Speech therapy     PT Frequency: 5x/week OT Frequency: 3x/week     Speech Therapy Frequency: 2x/week      Contractures      Additional Factors Info  Code Status, Allergies, Insulin Sliding Scale Code Status Info: Full Allergies Info: NKA   Insulin Sliding Scale Info: 3x daily with meals       Current Medications (12/28/2016):  This is the current hospital active medication list Current Facility-Administered Medications  Medication Dose Route Frequency Provider Last Rate Last Dose  . 0.9 %  sodium chloride infusion   Intravenous Continuous Meredeth Ide, MD 50 mL/hr at 12/27/16 2207    . aspirin suppository 300 mg  300 mg Rectal Daily Sharl Ma, Sarina Ill, MD       Or  . aspirin tablet 325 mg  325 mg Oral Daily Meredeth Ide, MD   325 mg at 12/27/16 0851  . atorvastatin (LIPITOR) tablet 40 mg  40 mg Oral q1800 Edsel Petrin, DO   40 mg at 12/27/16 1757  . cefTRIAXone (ROCEPHIN) 1 g in dextrose 5 % 50 mL IVPB  1 g Intravenous Q24H Edsel Petrin, DO   Stopped at 12/27/16 1714  . clopidogrel (PLAVIX) tablet 75 mg  75 mg Oral Daily Milon Dikes, MD   75 mg at 12/27/16 0851  . enoxaparin (LOVENOX) injection 30 mg  30 mg Subcutaneous Q24H Meredeth Ide, MD   30 mg at 12/27/16 0851  .  insulin aspart (novoLOG) injection 0-9 Units  0-9 Units Subcutaneous TID WC Meredeth Ide, MD   2 Units at 12/27/16 1823  . RESOURCE THICKENUP CLEAR   Oral PRN Mikhail, Nita Sells, DO      . senna-docusate (Senokot-S) tablet 1 tablet  1 tablet Oral QHS PRN Meredeth Ide, MD         Discharge Medications: Please see discharge summary for a list of discharge medications.  Relevant Imaging Results:  Relevant Lab Results:   Additional Information SSN: [id number] [place], [place]

## 2016-12-28 NOTE — Progress Notes (Signed)
CSW spoke with patient's facility. He is unable to return there at this level of needed care. CSW spoke with patient's niece, since unable to get patient's Sister on the phone. She recommends trying to contact his Aunt this afternoon when she gets home regarding discharge planning to a SNF.   Osborne Casco Maudene Stotler LCSWA [phone]

## 2016-12-29 ENCOUNTER — Inpatient Hospital Stay (HOSPITAL_COMMUNITY): Payer: Medicare Other

## 2016-12-29 DIAGNOSIS — E43 Unspecified severe protein-calorie malnutrition: Secondary | ICD-10-CM

## 2016-12-29 LAB — CBC
HCT: 34.6 % — ABNORMAL LOW (ref 39.0–52.0)
Hemoglobin: 11.4 g/dL — ABNORMAL LOW (ref 13.0–17.0)
MCH: 28.4 pg (ref 26.0–34.0)
MCHC: 32.9 g/dL (ref 30.0–36.0)
MCV: 86.3 fL (ref 78.0–100.0)
PLATELETS: 354 10*3/uL (ref 150–400)
RBC: 4.01 MIL/uL — AB (ref 4.22–5.81)
RDW: 15.1 % (ref 11.5–15.5)
WBC: 5.3 10*3/uL (ref 4.0–10.5)

## 2016-12-29 LAB — BASIC METABOLIC PANEL
Anion gap: 10 (ref 5–15)
BUN: 33 mg/dL — ABNORMAL HIGH (ref 6–20)
CHLORIDE: 112 mmol/L — AB (ref 101–111)
CO2: 17 mmol/L — ABNORMAL LOW (ref 22–32)
Calcium: 8.8 mg/dL — ABNORMAL LOW (ref 8.9–10.3)
Creatinine, Ser: 2.42 mg/dL — ABNORMAL HIGH (ref 0.61–1.24)
GFR, EST AFRICAN AMERICAN: 26 mL/min — AB (ref >60)
GFR, EST NON AFRICAN AMERICAN: 22 mL/min — AB (ref >60)
Glucose, Bld: 122 mg/dL — ABNORMAL HIGH (ref 65–99)
Potassium: 6 mmol/L — ABNORMAL HIGH (ref 3.5–5.1)
SODIUM: 139 mmol/L (ref 135–145)

## 2016-12-29 LAB — GLUCOSE, CAPILLARY
GLUCOSE-CAPILLARY: 75 mg/dL (ref 65–99)
GLUCOSE-CAPILLARY: 115 mg/dL — AB (ref 65–99)
Glucose-Capillary: 172 mg/dL — ABNORMAL HIGH (ref 65–99)
Glucose-Capillary: 105 mg/dL — ABNORMAL HIGH (ref 65–99)

## 2016-12-29 LAB — POTASSIUM: Potassium: 4.8 mmol/L (ref 3.5–5.1)

## 2016-12-29 MED ORDER — SODIUM POLYSTYRENE SULFONATE 15 GM/60ML PO SUSP
15.0000 g | Freq: Once | ORAL | Status: AC
  Administered 2016-12-29: 15 g via ORAL
  Filled 2016-12-29: qty 60

## 2016-12-29 NOTE — Consult Note (Addendum)
**Note Jerry-Identified via Obfuscation** [facility] CM Primary Care Navigator  12/29/2016  DEMARRIO Lane 05-17-1927 [id number]   Met with patientat the bedsideto identify possible discharge needs.  Patient resides at [facility] facility per Inpatient social worker note. Patient verbalized that he had weakness to his left side that had led to this admission.  Patient endorses Dr. Iona Lane with [facility] as the primary care provider.  Patient was unable to answer other questions asked and states that he "can not remember". Attempted to call patient's sister(Jerry Lane) several times (X 3) but unsuccessful and her phone is not set-up to be able to leave a message.   Per Inpatient social worker note, anticipated discharge plan is skilled nursing facility (SNF) prior to going back to [facility] facility (ALF).  Explained to patient about need to callprimary care provider's office for a post discharge follow-upappointment within a week or sooner if needs arise. Patient letter (with PCP's contact number) was provided at the bedside as a reminder. [facility] care management information provided for future needs that he may have.  Primary care provider's office called (spoke with Jerry Lane) and notified of patient's discharge disposition, need for post hospital follow-up and transition of care. Jerry Lane was notified of patient's health issues needing follow-up. She reports that assisted living facility has been providing patient's care as well as medications and transportation.  Made aware to refer patient to [facility] care management services as deemed necessary and appropriate for services in the near future.   For questions, please contact:  Jerry Lane, BSN, RN- [facility] Primary Care Navigator  Telephone: [phone] [place]

## 2016-12-29 NOTE — Progress Notes (Signed)
Physical Therapy Treatment Patient Details Name: Jerry Lane MRN: [id number] DOB: 1928/02/10 Today's Date: 12/29/2016    History of Present Illness Pt is an 83 y/o male admitted secondary to L sided weakness. Pt found to have an acute ischemic stroke. PMH including but not limited to DM, HTN, CKD and hx of CVA with residual L sided weakness.    PT Comments    Patient able to take steps this session with assist.  L LE weakness evident with recurvatum noted with stance on L and difficulty with progressing leg. Feel he would benefit from resting hand splint L UE.   Patient appropriate for SNF level rehab.  PT to follow acutely.   Follow Up Recommendations  SNF;Supervision/Assistance - 24 hour     Equipment Recommendations  None recommended by PT    Recommendations for Other Services       Precautions / Restrictions Precautions Precautions: Fall    Mobility  Bed Mobility Overal bed mobility: Needs Assistance Bed Mobility: Supine to Sit     Supine to sit: Mod assist;+2 for physical assistance     General bed mobility comments: assist and increased time for legs off bed and to lift trunk upright  Transfers Overall transfer level: Needs assistance Equipment used: 2 person hand held assist Transfers: Sit to/from Stand Sit to Stand: Mod assist;+2 safety/equipment         General transfer comment: lifting assist, and UE support on R  Ambulation/Gait Ambulation/Gait assistance: Mod assist;+2 safety/equipment Ambulation Distance (Feet): 6 Feet Assistive device: 1 person hand held assist;2 person hand held assist Gait Pattern/deviations: Step-to pattern;Decreased stride length;Shuffle;Decreased dorsiflexion - left;Decreased weight shift to left     General Gait Details: assist to progress L LE, for balance and for L knee stability (pops into recurvatum with loading)   Stairs            Wheelchair Mobility    Modified Rankin (Stroke Patients Only) Modified  Rankin (Stroke Patients Only) Pre-Morbid Rankin Score: Moderately severe disability (unknown) Modified Rankin: Moderately severe disability     Balance Overall balance assessment: Needs assistance Sitting-balance support: Feet supported Sitting balance-Leahy Scale: Fair     Standing balance support: During functional activity Standing balance-Leahy Scale: Poor Standing balance comment: mod A for standing balance                            Cognition Arousal/Alertness: Awake/alert Behavior During Therapy: WFL for tasks assessed/performed Overall Cognitive Status: Impaired/Different from baseline Area of Impairment: Orientation;Memory;Safety/judgement;Problem solving;Following commands                 Orientation Level: Disoriented to;Place;Time;Situation   Memory: Decreased short-term memory Following Commands: Follows one step commands with increased time Safety/Judgement: Decreased awareness of deficits   Problem Solving: Slow processing;Decreased initiation;Difficulty sequencing;Requires verbal cues;Requires tactile cues General Comments: perseverative on need to contact his primary MD to let him know he is in hospital.       Exercises      General Comments General comments (skin integrity, edema, etc.): time spent repositioning L UE due to wrist flexion and finger splay      Pertinent Vitals/Pain Faces Pain Scale: No hurt    Home Living                      Prior Function            PT Goals (current goals can now  be found in the care plan section) Progress towards PT goals: Progressing toward goals    Frequency    Min 2X/week      PT Plan      Co-evaluation              AM-PAC PT "6 Clicks" Daily Activity  Outcome Measure  Difficulty turning over in bed (including adjusting bedclothes, sheets and blankets)?: Unable Difficulty moving from lying on back to sitting on the side of the bed? : Unable Difficulty sitting  down on and standing up from a chair with arms (e.g., wheelchair, bedside commode, etc,.)?: Unable Help needed moving to and from a bed to chair (including a wheelchair)?: A Lot Help needed walking in hospital room?: A Lot Help needed climbing 3-5 steps with a railing? : Total 6 Click Score: 8    End of Session Equipment Utilized During Treatment: Gait belt Activity Tolerance: Patient tolerated treatment well Patient left: in chair;with call bell/phone within reach Nurse Communication: Mobility status PT Visit Diagnosis: Other abnormalities of gait and mobility (R26.89);Other symptoms and signs involving the nervous system (Z61.096)     Time: 0454-0981 PT Time Calculation (min) (ACUTE ONLY): 20 min  Charges:  $Therapeutic Activity: 8-22 mins                    G CodesSheran Lawless, [place] [phone] 12/29/2016    Elray Mcgregor 12/29/2016, 1:48 PM

## 2016-12-29 NOTE — Care Management Important Message (Signed)
Important Message  Patient Details  Name: Jerry Lane MRN: [id number] Date of Birth: 08/18/1927   Medicare Important Message Given:  Yes    Kyla Balzarine 12/29/2016, 9:13 AM

## 2016-12-29 NOTE — Progress Notes (Signed)
PROGRESS NOTE    Jerry Lane  ZOX:[id number] DOB: 1927-04-08 DOA: 12/25/2016 PCP: Mirna Mires, MD   Chief Complaint  Patient presents with  . Code Stroke    Brief Narrative:  HPI on 12/25/2016 by Dr. Mauro Kaufmann Jerry Lane  is a 83 y.o. male, With history of diabetes, hypertension, stroke with residual weakness in left arm who came to hospital after patient was found to be weak on left side around 3:30 PM. Patient was unable to ambulate due to weakness in his left leg. He denies slurred speech. No passing out. No seizure-like activity. Patient was seen in the ED and code stroke was called, patient was evaluated by tele neurology, and no TPA was given. CT head showed Question tiny approximate 1 cm evolving hypodensity at the posterior right frontal lobe, precentral gyrus, which may reflect a tiny acute cortical ischemic infarct. MRI was recommended. Patient denies chest pain, no shortness of breath. Denies dysuria. No abdominal pain. No nausea vomiting or diarrhea. Patient also had chest x-ray which showed questionable pneumonia, CT chest was performed which did not show pneumonia. He had abnormal UA with too numerous to count WBCs. Started on IV Levaquin. Blood and urine cultures have been obtained. Assessment & Plan   Acute CVA -CT head: Question tiny approximate 1 cm evolving hypodensity at the posterior right frontal lobe, precentral gyrus, may reflect tiny acute cortical ischemic infarct -MRI brain: Acute patchy right MCA territory and right posterior watershed territory infarcts. Old small left frontal lobe/MCH are probably infarct. -MRA head: No emergent large vessel occlusion or flow-limiting stenosis. -Carotid doppler: Right 60-79% ICA stenosis. Left 1-39% ICA stenosis. -Echocardigoram pending -LDL 102, Hemoglobin A1c 7.1 -Neurology consulted and appreciated. Discussed with Dr. Roda Shutters- patient may follow up with vascular as an outpatient for ICA stenosis.  -Continue aspirin -PT/OT -  rec SNF -Speech therapy placed patient on dysphagia 1 diet, MBS pending  -Not sure why patient is not on statin from his prior strokes, started atorvastatin 40 mg  -Follow up with [facility] in 6 weeks.  Ecoli UTI -UA showed rare bacteria, TNTC WBC, moderate leukocytes, negative nitrites -Currently afebrile and no leukocytosis -urine culture >100K Ecoli -blood cultures show no growth to date -patient was initially placed on levaquin- however discontinued -Continue ceftriaxone  Acute metabolic encephalopathy -suspect secondary to UTI vs Acute CVA -improving, continue to treat UTI  Acute kidney injury on chronic kidney disease stage IV -Baseline creatinine approximately 2.7-3.2 -Creatinine on admission 3.6, down to 2.42 -Continue to monitor BMP  Essential hypertension -Hold amlodipine, allow permissive hypertension -may resume amlodipine on discharge  -BP normotensive   Diabetes mellitus, type II -Patient uses Tradjenta at home, continue on discharge -was placed on insulin sliding scale CBG monitoring during hospitalization  -Hemoglobin A1c 7.1  Anemia of chronic disease -hemoglobin currently 10.8, appears to be stable and close to baseline  Pressure ucler, Stage II -on coccyx  -not sure if this was present on admission  Hyperkalemia -will repeat potassium, give dose of kayexalate -continue to monitor BMP   DVT Prophylaxis  Lovenox  Code Status: Full  Family Communication: None at bedside  Disposition Plan: Admitted, pending SNF at discharge- possibly 9/25  Consultants Neurology  Procedures  Carotid Doppler Echocardiogram  Antibiotics   Anti-infectives    Start     Dose/Rate Route Frequency Ordered Stop   12/27/16 2300  levofloxacin (LEVAQUIN) IVPB 500 mg  Status:  Discontinued     500 mg 100 mL/hr over 60 Minutes  Intravenous Every 48 hours 12/26/16 0118 12/26/16 1337   12/27/16 1430  cefTRIAXone (ROCEPHIN) 1 g in dextrose 5 % 50 mL IVPB     1 g 100  mL/hr over 30 Minutes Intravenous Every 24 hours 12/27/16 1343     12/27/16 1345  cefTRIAXone (ROCEPHIN) 1 g in dextrose 5 % 50 mL IVPB  Status:  Discontinued     1 g 100 mL/hr over 30 Minutes Intravenous Every 24 hours 12/27/16 1343 12/27/16 1343   12/25/16 2100  levofloxacin (LEVAQUIN) IVPB 500 mg     500 mg 100 mL/hr over 60 Minutes Intravenous  Once 12/25/16 2058 12/26/16 0026      Subjective:   Jerry Lane seen and examined today. No complaints today. Denies chest pain, shortness of breath, abdominal pain, N/V/D/C.  Objective:   Vitals:   12/29/16 0035 12/29/16 0043 12/29/16 0308 12/29/16 1453  BP:   140/65 138/68  Pulse:   79 80  Resp:   16 16  Temp:   97.8 F (36.6 C) 98.2 F (36.8 C)  TempSrc:   Oral Oral  SpO2: 100% 93% 100% 100%  Weight:      Height:        Intake/Output Summary (Last 24 hours) at 12/29/16 1532 Last data filed at 12/29/16 1433  Gross per 24 hour  Intake          1046.66 ml  Output             1050 ml  Net            -3.34 ml   Filed Weights   12/25/16 1925  Weight: 65.8 kg (145 lb)   Exam  General: Well developed, well nourished, NAD, appears stated age  HEENT: NCAT, mucous membranes moist.   Cardiovascular: S1 S2 auscultated, 2/6 SEM, RRR (monitor shows SR with PVC)  Respiratory: Clear to auscultation bilaterally with equal chest rise  Abdomen: Soft, nontender, nondistended, + bowel sounds  Extremities: warm dry without cyanosis clubbing or edema  Neuro: AAOx3, nonfocal  Psych: Appropriate  Data Reviewed: I have personally reviewed following labs and imaging studies  CBC:  Recent Labs Lab 12/25/16 1905 12/25/16 2009 12/26/16 0535 12/27/16 0502 12/28/16 0604 12/29/16 1131  WBC 5.9  --  6.0 5.3 4.6 5.3  NEUTROABS 4.6  --   --   --   --   --   HGB 10.6* 12.9* 10.9* 11.1* 10.8* 11.4*  HCT 32.5* 38.0* 34.1* 35.5* 34.9* 34.6*  MCV 85.8  --  85.7 86.4 86.8 86.3  PLT 398  --  409* 463* 370 354   Basic Metabolic  Panel:  Recent Labs Lab 12/25/16 1905 12/25/16 2009 12/26/16 0535 12/26/16 1917 12/27/16 0502 12/28/16 0604 12/29/16 1131  NA 136 138  --  136 137 138 139  K 4.4 4.8  --  5.6* 4.0 5.4* 6.0*  CL 106 107  --  110 109 111 112*  CO2 20*  --   --  14* 18* 18* 17*  GLUCOSE 225* 204*  --  97 107* 99 122*  BUN 62* 56*  --  48* 41* 37* 33*  CREATININE 3.60* 3.50* 3.22* 3.10* 2.84* 2.61* 2.42*  CALCIUM 8.7*  --   --  9.0 9.0 8.8* 8.8*  MG  --   --   --  2.0  --   --   --    GFR: Estimated Creatinine Clearance: 20 mL/min (A) (by C-G formula based on SCr of 2.42 mg/dL (  H)). Liver Function Tests:  Recent Labs Lab 12/25/16 1905  AST 20  ALT 18  ALKPHOS 66  BILITOT 0.4  PROT 8.1  ALBUMIN 3.5   No results for input(s): LIPASE, AMYLASE in the last 168 hours. No results for input(s): AMMONIA in the last 168 hours. Coagulation Profile:  Recent Labs Lab 12/25/16 1905  INR 1.16   Cardiac Enzymes: No results for input(s): CKTOTAL, CKMB, CKMBINDEX, TROPONINI in the last 168 hours. BNP (last 3 results) No results for input(s): PROBNP in the last 8760 hours. HbA1C: No results for input(s): HGBA1C in the last 72 hours. CBG:  Recent Labs Lab 12/28/16 1230 12/28/16 1653 12/28/16 2152 12/29/16 0752 12/29/16 1226  GLUCAP 192* 117* 160* 75 115*   Lipid Profile: No results for input(s): CHOL, HDL, LDLCALC, TRIG, CHOLHDL, LDLDIRECT in the last 72 hours. Thyroid Function Tests: No results for input(s): TSH, T4TOTAL, FREET4, T3FREE, THYROIDAB in the last 72 hours. Anemia Panel: No results for input(s): VITAMINB12, FOLATE, FERRITIN, TIBC, IRON, RETICCTPCT in the last 72 hours. Urine analysis:    Component Value Date/Time   COLORURINE YELLOW 12/25/2016 2003   APPEARANCEUR HAZY (A) 12/25/2016 2003   LABSPEC 1.015 12/25/2016 2003   PHURINE 5.0 12/25/2016 2003   GLUCOSEU NEGATIVE 12/25/2016 2003   HGBUR NEGATIVE 12/25/2016 2003   BILIRUBINUR NEGATIVE 12/25/2016 2003   KETONESUR  NEGATIVE 12/25/2016 2003   PROTEINUR 30 (A) 12/25/2016 2003   UROBILINOGEN 0.2 10/11/2006 1026   NITRITE NEGATIVE 12/25/2016 2003   LEUKOCYTESUR MODERATE (A) 12/25/2016 2003   Sepsis Labs: (procalcitonin:4,lacticidven:4)  ) Recent Results (from the past 240 hour(s))  Urine Culture     Status: Abnormal   Collection Time: 12/25/16  8:03 PM  Result Value Ref Range Status   Specimen Description URINE, CLEAN CATCH  Final   Special Requests NONE  Final   Culture >=100,000 COLONIES/mL ESCHERICHIA COLI (A)  Final   Report Status 12/28/2016 FINAL  Final   Organism ID, Bacteria ESCHERICHIA COLI (A)  Final      Susceptibility   Escherichia coli - MIC*    AMPICILLIN <=2 SENSITIVE Sensitive     CEFAZOLIN <=4 SENSITIVE Sensitive     CEFTRIAXONE <=1 SENSITIVE Sensitive     CIPROFLOXACIN <=0.25 SENSITIVE Sensitive     GENTAMICIN <=1 SENSITIVE Sensitive     IMIPENEM <=0.25 SENSITIVE Sensitive     NITROFURANTOIN <=16 SENSITIVE Sensitive     TRIMETH/SULFA <=20 SENSITIVE Sensitive     AMPICILLIN/SULBACTAM <=2 SENSITIVE Sensitive     PIP/TAZO <=4 SENSITIVE Sensitive     Extended ESBL NEGATIVE Sensitive     * >=100,000 COLONIES/mL ESCHERICHIA COLI  Culture, blood (Routine X 2) w Reflex to ID Panel     Status: None (Preliminary result)   Collection Time: 12/25/16 10:35 PM  Result Value Ref Range Status   Specimen Description RIGHT ANTECUBITAL  Final   Special Requests   Final    BOTTLES DRAWN AEROBIC AND ANAEROBIC Blood Culture adequate volume   Culture NO GROWTH 4 DAYS  Final   Report Status PENDING  Incomplete  Culture, blood (Routine X 2) w Reflex to ID Panel     Status: None (Preliminary result)   Collection Time: 12/25/16 10:49 PM  Result Value Ref Range Status   Specimen Description BLOOD LEFT HAND  Final   Special Requests   Final    BOTTLES DRAWN AEROBIC ONLY Blood Culture results may not be optimal due to an inadequate volume of blood received in culture bottles  Culture  NO GROWTH 4 DAYS  Final   Report Status PENDING  Incomplete  MRSA PCR Screening     Status: None   Collection Time: 12/26/16 11:02 PM  Result Value Ref Range Status   MRSA by PCR NEGATIVE NEGATIVE Final    Comment:        The GeneXpert MRSA Assay (FDA approved for NASAL specimens only), is one component of a comprehensive MRSA colonization surveillance program. It is not intended to diagnose MRSA infection nor to guide or monitor treatment for MRSA infections.       Radiology Studies: Dg Swallowing Func-speech Pathology  Result Date: 12/29/2016 Objective Swallowing Evaluation: Type of Study: MBS-Modified Barium Swallow Study Patient Details Name: Jerry Lane MRN: [id number] Date of Birth: 1928-01-20 Today's Date: 12/29/2016 Time: SLP Start Time (ACUTE ONLY): 1325-SLP Stop Time (ACUTE ONLY): 1347 SLP Time Calculation (min) (ACUTE ONLY): 22 min Past Medical History: Past Medical History: Diagnosis Date . Arthritis  . Diabetes mellitus   Type II . Hypertension  . Stroke [facility])   no residual weakness Past Surgical History: Past Surgical History: Procedure Laterality Date . TIBIA FRACTURE SURGERY  1987  Right, [facility]? HPI: Pt is an 83 y/o male admitted secondary to L sided weakness. Pt found to have an acute ischemic stroke. PMH including but not limited to DM, HTN, CKD and hx of CVA with residual L sided weakness. MRI 12/26/16 revealed acute patchy RIGHT MCA territory and RIGHT posterior watershed territory infarcts. Old small LEFT frontal lobe/MCA territory infarct, multiple old small basal ganglia, thalamus, pontine and cerebellar infarcts. Pt having difficulty masticating solids, with wet vocal quality despite passing RN swallow screen at [facility].  Subjective: pt alert, cooperative Assessment / Plan / Recommendation CHL IP CLINICAL IMPRESSIONS 12/29/2016 Clinical Impression Pt has a moderate oropharyngeal dysphagia characterized by reduced bolus cohesion as well as delayed and incomplete oral transit.  Premature spillage results in penetration of thin liquids before the swallow, but he also penetrates after the swallow due to residue that remains in the vallecula and pyriform sinuses due to reduced base of tongue retraction and hyolaryngeal excursion. Throat clearing occurred during episodes of penetration, but not during every instance. Cued second swallows help to reduce oropharyngeal residue, but he needs Mod cues and additional time to complete this swallow. Nectar thick liquids and purees are better contained in the interim. He also seems to have mildly reduced residue when he drinks from a straw because it more naturally puts him into a chin tuck position. Recommend to continue current diet (Dys 1 textures, nectar thick liquids), encouraging use of straws, and wroking to implement a faster, more consistent dry swallow.  SLP Visit Diagnosis Dysphagia, oropharyngeal phase (R13.12) Attention and concentration deficit following -- Frontal lobe and executive function deficit following -- Impact on safety and function Mild aspiration risk   CHL IP TREATMENT RECOMMENDATION 12/29/2016 Treatment Recommendations Therapy as outlined in treatment plan below   Prognosis 12/29/2016 Prognosis for Safe Diet Advancement Good Barriers to Reach Goals Cognitive deficits Barriers/Prognosis Comment -- CHL IP DIET RECOMMENDATION 12/29/2016 SLP Diet Recommendations Dysphagia 1 (Puree) solids;Nectar thick liquid Liquid Administration via Straw Medication Administration Crushed with puree Compensations Slow rate;Small sips/bites;Minimize environmental distractions;Multiple dry swallows after each bite/sip Postural Changes Remain semi-upright after after feeds/meals (Comment);Seated upright at 90 degrees   CHL IP OTHER RECOMMENDATIONS 12/29/2016 Recommended Consults -- Oral Care Recommendations Oral care BID Other Recommendations Order thickener from pharmacy;Prohibited food (jello, ice cream, thin soups);Remove water pitcher  CHL IP  FOLLOW UP RECOMMENDATIONS 12/29/2016 Follow up Recommendations Skilled Nursing facility   [facility] IP FREQUENCY AND DURATION 12/29/2016 Speech Therapy Frequency (ACUTE ONLY) min 2x/week Treatment Duration 2 weeks      CHL IP ORAL PHASE 12/29/2016 Oral Phase Impaired Oral - Pudding Teaspoon -- Oral - Pudding Cup -- Oral - Honey Teaspoon -- Oral - Honey Cup -- Oral - Nectar Teaspoon -- Oral - Nectar Cup -- Oral - Nectar Straw Reduced posterior propulsion;Lingual/palatal residue;Delayed oral transit;Decreased bolus cohesion Oral - Thin Teaspoon -- Oral - Thin Cup Reduced posterior propulsion;Delayed oral transit;Decreased bolus cohesion;Premature spillage Oral - Thin Straw Reduced posterior propulsion;Delayed oral transit;Decreased bolus cohesion;Premature spillage Oral - Puree Reduced posterior propulsion;Lingual/palatal residue;Delayed oral transit;Decreased bolus cohesion Oral - Mech Soft -- Oral - Regular -- Oral - Multi-Consistency -- Oral - Pill -- Oral Phase - Comment --  CHL IP PHARYNGEAL PHASE 12/29/2016 Pharyngeal Phase Impaired Pharyngeal- Pudding Teaspoon -- Pharyngeal -- Pharyngeal- Pudding Cup -- Pharyngeal -- Pharyngeal- Honey Teaspoon -- Pharyngeal -- Pharyngeal- Honey Cup -- Pharyngeal -- Pharyngeal- Nectar Teaspoon -- Pharyngeal -- Pharyngeal- Nectar Cup -- Pharyngeal -- Pharyngeal- Nectar Straw Reduced tongue base retraction;Pharyngeal residue - valleculae;Reduced anterior laryngeal mobility;Pharyngeal residue - pyriform Pharyngeal -- Pharyngeal- Thin Teaspoon -- Pharyngeal -- Pharyngeal- Thin Cup Reduced tongue base retraction;Pharyngeal residue - valleculae;Reduced anterior laryngeal mobility;Pharyngeal residue - pyriform;Penetration/Aspiration before swallow;Penetration/Apiration after swallow Pharyngeal Material enters airway, remains ABOVE vocal cords and not ejected out Pharyngeal- Thin Straw Reduced tongue base retraction;Pharyngeal residue - valleculae;Reduced anterior laryngeal mobility;Pharyngeal  residue - pyriform;Penetration/Apiration after swallow Pharyngeal Material enters airway, remains ABOVE vocal cords and not ejected out Pharyngeal- Puree Reduced tongue base retraction;Pharyngeal residue - valleculae;Reduced anterior laryngeal mobility Pharyngeal -- Pharyngeal- Mechanical Soft -- Pharyngeal -- Pharyngeal- Regular -- Pharyngeal -- Pharyngeal- Multi-consistency -- Pharyngeal -- Pharyngeal- Pill -- Pharyngeal -- Pharyngeal Comment --  CHL IP CERVICAL ESOPHAGEAL PHASE 12/29/2016 Cervical Esophageal Phase WFL Pudding Teaspoon -- Pudding Cup -- Honey Teaspoon -- Honey Cup -- Nectar Teaspoon -- Nectar Cup -- Nectar Straw -- Thin Teaspoon -- Thin Cup -- Thin Straw -- Puree -- Mechanical Soft -- Regular -- Multi-consistency -- Pill -- Cervical Esophageal Comment -- No flowsheet data found. Maxcine Ham 12/29/2016, 2:12 PM  Maxcine Ham, M.A. CCC-SLP [phone]               Scheduled Meds: . aspirin  300 mg Rectal Daily   Or  . aspirin  325 mg Oral Daily  . atorvastatin  40 mg Oral q1800  . clopidogrel  75 mg Oral Daily  . enoxaparin (LOVENOX) injection  30 mg Subcutaneous Q24H  . feeding supplement (ENSURE ENLIVE)  237 mL Oral BID BM  . insulin aspart  0-9 Units Subcutaneous TID WC  . multivitamin with minerals  1 tablet Oral Daily  . sodium polystyrene  15 g Oral Once   Continuous Infusions: . sodium chloride 1,000 mL (12/29/16 1017)  . cefTRIAXone (ROCEPHIN)  IV Stopped (12/29/16 1440)     LOS: 4 days   Time Spent in minutes   40 minutes  Jonni Oelkers D.O. on 12/29/2016 at 3:32 PM  Between 7am to 7pm - Pager - [phone]  After 7pm go to [email or web address] - password [email or web address]  And look for the night coverage person covering for me after hours  [facility] Office  [phone]

## 2016-12-29 NOTE — Progress Notes (Signed)
Modified Barium Swallow Progress Note  Patient Details  Name: Jerry Lane MRN: [id number] Date of Birth: [birth year]  Today's Date: 12/29/2016  Modified Barium Swallow completed.  Full report located under Chart Review in the Imaging Section.  Brief recommendations include the following:  Clinical Impression  Pt has a moderate oropharyngeal dysphagia characterized by reduced bolus cohesion as well as delayed and incomplete oral transit. Premature spillage results in penetration of thin liquids before the swallow, but he also penetrates after the swallow due to residue that remains in the vallecula and pyriform sinuses due to reduced base of tongue retraction and hyolaryngeal excursion. Throat clearing occurred during episodes of penetration, but not during every instance. Cued second swallows help to reduce oropharyngeal residue, but he needs Mod cues and additional time to complete this swallow. Nectar thick liquids and purees are better contained in the interim. He also seems to have mildly reduced residue when he drinks from a straw because it more naturally puts him into a chin tuck position. Recommend to continue current diet (Dys 1 textures, nectar thick liquids), encouraging use of straws, and wroking to implement a faster, more consistent dry swallow.    Swallow Evaluation Recommendations       SLP Diet Recommendations: Dysphagia 1 (Puree) solids;Nectar thick liquid   Liquid Administration via: Straw   Medication Administration: Crushed with puree   Supervision: Staff to assist with self feeding;Full supervision/cueing for compensatory strategies   Compensations: Slow rate;Small sips/bites;Minimize environmental distractions;Multiple dry swallows after each bite/sip   Postural Changes: Remain semi-upright after after feeds/meals (Comment);Seated upright at 90 degrees   Oral Care Recommendations: Oral care BID   Other Recommendations: Order thickener from pharmacy;Prohibited  food (jello, ice cream, thin soups);Remove water pitcher    Maxcine Ham 12/29/2016,2:12 PM   Maxcine Ham, M.A. CCC-SLP [phone]

## 2016-12-29 NOTE — Progress Notes (Signed)
  Speech Language Pathology Treatment: Dysphagia;Cognitive-Linquistic  Patient Details Name: Jerry Lane MRN: [id number] DOB: 1927-11-15 Today's Date: 12/29/2016 Time: 6578-4696 SLP Time Calculation (min) (ACUTE ONLY): 23 min  Assessment / Plan / Recommendation Clinical Impression  SLP provided skilled observation during breakfast meal Oral preparation and transit is slow even with pureed foods, but pt uses a slow rate and liquid washes with Mod I. Min cues were provided for smaller sips, as a wet vocal quality and delayed throat clearing was observed during intake of large, sequential boluses of both thin and nectar thick liquids. Intermittent eructation is also noted. Given his persistent signs concerning for dysphagia and potentially reduced airway protection, recommend to proceed with MBS. Can continue current diet with aspiration precautions and careful assistance with feeding until that can be completed later today.  Cognitively, he needs Max cues for emergent awareness of his decreased speech intelligibility. Max cues were also provided for use of intelligibility strategies (over-articulation, pausing between words) with mild improvement in clarity noted. He needed Mod cues for recall of information presented during this session for short-term carryover.    HPI HPI: Pt is an 83 y/o male admitted secondary to L sided weakness. Pt found to have an acute ischemic stroke. PMH including but not limited to DM, HTN, CKD and hx of CVA with residual L sided weakness. MRI 12/26/16 revealed acute patchy RIGHT MCA territory and RIGHT posterior watershed territory infarcts. Old small LEFT frontal lobe/MCA territory infarct, multiple old small basal ganglia, thalamus, pontine and cerebellar infarcts. Pt having difficulty masticating solids, with wet vocal quality despite passing RN swallow screen at [facility].       SLP Plan  MBS       Recommendations  Diet recommendations: Dysphagia 1 (puree);Nectar-thick  liquid Liquids provided via: Cup;Straw Medication Administration: Crushed with puree Supervision: Patient able to self feed;Full supervision/cueing for compensatory strategies;Staff to assist with self feeding Compensations: Slow rate;Small sips/bites;Minimize environmental distractions Postural Changes and/or Swallow Maneuvers: Seated upright 90 degrees;Upright 30-60 min after meal                Oral Care Recommendations: Oral care BID Follow up Recommendations: Skilled Nursing facility SLP Visit Diagnosis: Dysarthria and anarthria (R47.1);Cognitive communication deficit (R41.841);Dysphagia, unspecified (R13.10) Plan: MBS       GO                Maxcine Ham 12/29/2016, 9:37 AM  Maxcine Ham, M.A. CCC-SLP [phone]

## 2016-12-30 DIAGNOSIS — R471 Dysarthria and anarthria: Secondary | ICD-10-CM | POA: Diagnosis not present

## 2016-12-30 DIAGNOSIS — R2689 Other abnormalities of gait and mobility: Secondary | ICD-10-CM | POA: Diagnosis not present

## 2016-12-30 DIAGNOSIS — R531 Weakness: Secondary | ICD-10-CM | POA: Diagnosis not present

## 2016-12-30 DIAGNOSIS — I63239 Cerebral infarction due to unspecified occlusion or stenosis of unspecified carotid arteries: Secondary | ICD-10-CM | POA: Diagnosis not present

## 2016-12-30 DIAGNOSIS — N183 Chronic kidney disease, stage 3 (moderate): Secondary | ICD-10-CM | POA: Diagnosis not present

## 2016-12-30 DIAGNOSIS — I679 Cerebrovascular disease, unspecified: Secondary | ICD-10-CM | POA: Diagnosis not present

## 2016-12-30 DIAGNOSIS — R52 Pain, unspecified: Secondary | ICD-10-CM | POA: Diagnosis not present

## 2016-12-30 DIAGNOSIS — D649 Anemia, unspecified: Secondary | ICD-10-CM | POA: Diagnosis not present

## 2016-12-30 DIAGNOSIS — Z8673 Personal history of transient ischemic attack (TIA), and cerebral infarction without residual deficits: Secondary | ICD-10-CM | POA: Diagnosis not present

## 2016-12-30 DIAGNOSIS — G8194 Hemiplegia, unspecified affecting left nondominant side: Secondary | ICD-10-CM | POA: Diagnosis not present

## 2016-12-30 DIAGNOSIS — D519 Vitamin B12 deficiency anemia, unspecified: Secondary | ICD-10-CM | POA: Diagnosis not present

## 2016-12-30 DIAGNOSIS — I129 Hypertensive chronic kidney disease with stage 1 through stage 4 chronic kidney disease, or unspecified chronic kidney disease: Secondary | ICD-10-CM | POA: Diagnosis not present

## 2016-12-30 DIAGNOSIS — L89159 Pressure ulcer of sacral region, unspecified stage: Secondary | ICD-10-CM | POA: Diagnosis not present

## 2016-12-30 DIAGNOSIS — E1122 Type 2 diabetes mellitus with diabetic chronic kidney disease: Secondary | ICD-10-CM | POA: Diagnosis not present

## 2016-12-30 DIAGNOSIS — L89152 Pressure ulcer of sacral region, stage 2: Secondary | ICD-10-CM | POA: Diagnosis not present

## 2016-12-30 DIAGNOSIS — R2681 Unsteadiness on feet: Secondary | ICD-10-CM | POA: Diagnosis not present

## 2016-12-30 DIAGNOSIS — N390 Urinary tract infection, site not specified: Secondary | ICD-10-CM | POA: Diagnosis not present

## 2016-12-30 DIAGNOSIS — I69354 Hemiplegia and hemiparesis following cerebral infarction affecting left non-dominant side: Secondary | ICD-10-CM | POA: Diagnosis not present

## 2016-12-30 DIAGNOSIS — E785 Hyperlipidemia, unspecified: Secondary | ICD-10-CM | POA: Diagnosis not present

## 2016-12-30 DIAGNOSIS — R7989 Other specified abnormal findings of blood chemistry: Secondary | ICD-10-CM | POA: Diagnosis not present

## 2016-12-30 DIAGNOSIS — R1312 Dysphagia, oropharyngeal phase: Secondary | ICD-10-CM | POA: Diagnosis not present

## 2016-12-30 DIAGNOSIS — M1380 Other specified arthritis, unspecified site: Secondary | ICD-10-CM | POA: Diagnosis not present

## 2016-12-30 DIAGNOSIS — E1322 Other specified diabetes mellitus with diabetic chronic kidney disease: Secondary | ICD-10-CM | POA: Diagnosis not present

## 2016-12-30 DIAGNOSIS — G464 Cerebellar stroke syndrome: Secondary | ICD-10-CM | POA: Diagnosis not present

## 2016-12-30 DIAGNOSIS — I63231 Cerebral infarction due to unspecified occlusion or stenosis of right carotid arteries: Secondary | ICD-10-CM | POA: Diagnosis not present

## 2016-12-30 DIAGNOSIS — I10 Essential (primary) hypertension: Secondary | ICD-10-CM | POA: Diagnosis not present

## 2016-12-30 LAB — BASIC METABOLIC PANEL
Anion gap: 12 (ref 5–15)
BUN: 27 mg/dL — AB (ref 6–20)
CALCIUM: 8.7 mg/dL — AB (ref 8.9–10.3)
CHLORIDE: 110 mmol/L (ref 101–111)
CO2: 20 mmol/L — AB (ref 22–32)
CREATININE: 2.3 mg/dL — AB (ref 0.61–1.24)
GFR calc non Af Amer: 24 mL/min — ABNORMAL LOW (ref >60)
GFR, EST AFRICAN AMERICAN: 28 mL/min — AB (ref >60)
Glucose, Bld: 106 mg/dL — ABNORMAL HIGH (ref 65–99)
Potassium: 4.6 mmol/L (ref 3.5–5.1)
Sodium: 142 mmol/L (ref 135–145)

## 2016-12-30 LAB — GLUCOSE, CAPILLARY
Glucose-Capillary: 89 mg/dL (ref 65–99)
Glucose-Capillary: 136 mg/dL — ABNORMAL HIGH (ref 65–99)

## 2016-12-30 LAB — CULTURE, BLOOD (ROUTINE X 2)
CULTURE: NO GROWTH
Culture: NO GROWTH
SPECIAL REQUESTS: ADEQUATE

## 2016-12-30 LAB — CBC
HEMATOCRIT: 34.8 % — AB (ref 39.0–52.0)
Hemoglobin: 11 g/dL — ABNORMAL LOW (ref 13.0–17.0)
MCH: 27.3 pg (ref 26.0–34.0)
MCHC: 31.6 g/dL (ref 30.0–36.0)
MCV: 86.4 fL (ref 78.0–100.0)
Platelets: 344 10*3/uL (ref 150–400)
RBC: 4.03 MIL/uL — ABNORMAL LOW (ref 4.22–5.81)
RDW: 14.5 % (ref 11.5–15.5)
WBC: 4 10*3/uL (ref 4.0–10.5)

## 2016-12-30 MED ORDER — ENSURE ENLIVE PO LIQD: 237.0000 mL | Freq: Two times a day (BID) | ORAL | 12 refills | Status: DC

## 2016-12-30 MED ORDER — CLOPIDOGREL BISULFATE 75 MG PO TABS: 75.0000 mg | ORAL_TABLET | Freq: Every day | ORAL | 0 refills | Status: AC

## 2016-12-30 MED ORDER — ADULT MULTIVITAMIN W/MINERALS CH: 1.0000 | ORAL_TABLET | Freq: Every day | ORAL | Status: AC

## 2016-12-30 MED ORDER — RESOURCE THICKENUP CLEAR PO POWD: ORAL | Status: DC

## 2016-12-30 MED ORDER — ATORVASTATIN CALCIUM 40 MG PO TABS: 40.0000 mg | ORAL_TABLET | Freq: Every day | ORAL | 0 refills | Status: AC

## 2016-12-30 MED ORDER — CEPHALEXIN 500 MG PO CAPS: 500.0000 mg | ORAL_CAPSULE | Freq: Two times a day (BID) | ORAL | 0 refills | Status: DC

## 2016-12-30 MED ORDER — CEPHALEXIN 500 MG PO CAPS: 500.0000 mg | ORAL_CAPSULE | Freq: Two times a day (BID) | ORAL | 0 refills | Status: AC

## 2016-12-30 MED ORDER — ASPIRIN 325 MG PO TABS: 325.0000 mg | ORAL_TABLET | Freq: Every day | ORAL | 0 refills | Status: DC

## 2016-12-30 MED ORDER — CEPHALEXIN 500 MG PO CAPS
500.0000 mg | ORAL_CAPSULE | Freq: Two times a day (BID) | ORAL | Status: DC
  Administered 2016-12-30: 500 mg via ORAL
  Filled 2016-12-30: qty 1

## 2016-12-30 NOTE — Progress Notes (Signed)
Patient will DC to: [facility] Anticipated DC date: 12/30/16 Family notified: Sister Transport by: Sharin Mons 2:30pm   Per MD patient ready for DC to [facility]. RN, patient, patient's family, and facility notified of DC. Discharge Summary sent to facility. RN given number for report [phone]). DC packet on chart. Ambulance transport requested for patient.   CSW signing off.  Cristobal Goldmann, [place] Clinical Social Worker [phone]

## 2016-12-30 NOTE — Discharge Summary (Signed)
Physician Discharge Summary  Jerry Lane ZOX:[id number] DOB: [birth year] DOA: 12/25/2016  PCP: Jerry Mires, MD  Admit date: 12/25/2016 Discharge date: 12/30/2016  Admitted From: Home Disposition: SNF  Recommendations for Outpatient Follow-up:  1. Follow up with PCP in 1-2 weeks 2. Follow up with Neurology in 6 weeks 3. Follow up with Vascular Surgery as an outpatient 4. Follow up with WOC Nurse at SNF 5. Please obtain CMP/CBC, Mag, Phos in one week 6. Have SLP follow at SNF 7. Please follow up on the following pending results:  Home Health: No Equipment/Devices: None    Discharge Condition: Stable CODE STATUS: FULL CODE Diet recommendation: Heart Healthy Carb Modified Dysphagia 1 Diet with Nectar Thick Liquids  Brief/Interim Summary: [name] a 83 y.o.male,With history of diabetes, hypertension, stroke with residual weakness in left arm who came to hospital after patient was found to be weak on left side around 3:30 PM. Patient was unable to ambulate due to weakness in his left leg. He denies slurred speech. No passing out. No seizure-like activity.  Patient was seen in the ED and code stroke was called, patient was evaluated by tele neurology, and no TPA was given. CT head showed Question tiny approximate 1 cm evolving hypodensity at the posterior right frontal lobe, precentral gyrus, which may reflect a tiny acute cortical ischemic infarct. MRI was recommended which showed acute patchy Right MCA territory and Right posterior Watershed Territory Infarcts. Neurology was consulted and placed patient on ASA and Atorvastatin.    Patient also had chest x-ray which showed questionable pneumonia,CT chest was performed which did not show pneumonia. He had abnormal UA with too numerous to count WBCs. Started on IV Levaquin and changed to Ceftriaxone and now to po Keflex. Patient's hospitalization was complicated by Hyperkalemia which has improved and at this time patient was deemed  medically stable to D/C to SNF.   Discharge Diagnoses:  Active Problems:   Stroke (cerebrum) (HCC)   Pressure injury of skin   Carotid stenosis, symptomatic, with infarction (HCC)   Hyperlipidemia   Protein-calorie malnutrition, severe  Acute CVA -CT head: Question tiny approximate 1 cm evolving hypodensity at the posterior right frontal lobe, precentral gyrus, may reflect tiny acute cortical ischemic infarct -MRI brain: Acute patchy right MCA territory and right posterior watershed territory infarcts. Old small left frontal lobe/MCH are probably infarct. -MRA head: No emergent large vessel occlusion or flow-limiting stenosis. -Carotid doppler: Right 60-79% ICA stenosis. Left 1-39% ICA stenosis. -Echocardigoram showed EF of 55-60% with Grade 1 DD -LDL 102, Hemoglobin A1c 7.1 -Neurology consulted and appreciated. Discussed with Dr. Roda Shutters- patient may follow up with Vascular Dr. Myra Gianotti as an outpatient for ICA stenosis.  -Continue aspirin 325 mg po RC/Daily po and Plavix -PT/OT - Rec SNF -SLP recommending Dysphagia 1 Det with Nectar Thick Liquids -Speech therapy placed patient on dysphagia 1 diet, MBS pending  -Not sure why patient is not on statin from his prior strokes, started atorvastatin 40 mg  -Follow up with [facility] in 6 weeks.  Ecoli UTI -UA showed rare bacteria, TNTC WBC, moderate leukocytes, negative nitrites -Currently afebrile and no leukocytosis (WBC 4) -urine culture >100K Ecoli -blood cultures show no growth to date -Patient was initially placed on levaquin- however discontinued -Continued ceftriaxone and switched to po Keflex x 7 more days  Acute metabolic Encephalopathy -Suspect secondary to UTI vs Acute CVA -improving, continue to treat UTI with po Keflex  Acute kidney injury on chronic kidney disease stage IV -Baseline creatinine approximately 2.7-3.2 -  Creatinine on admission 3.6, down to 2.3- -Continue to monitor BMP as an outpatient   Essential  hypertension -Held Amlodipine to allow for permissive hypertension -May resume amlodipine on discharge  -BP normotensive   Diabetes Mellitus, Type II -Patient uses Tradjenta at home, continue on discharge -Was placed on insulin sliding scale CBG monitoring during hospitalization  -Hemoglobin A1c 7.1  Anemia of Chronic Disease -Hemoglobin currently 11.0, appears to be stable and close to baseline -Follow up as an outpatient   Pressure ucler, Stage II -on Coccyx  -not sure if this was present on admission -C/w Repositioning and WOC nurse at SNF  Hyperkalemia, improved -Will repeat Potassium, given dose of Kayexalate and improved -continue to monitor BMP as an outpatient   Discharge Instructions  Discharge Instructions    Ambulatory referral to Neurology    Complete by:  As directed    Follow up with stroke clinic Darrol Angel preferred, if not available, then consider Sylvie Farrier, [facility] or Lucia Gaskins whoever is available) at [facility] in about 6-8 weeks. Thanks.   Discharge instructions    Complete by:  As directed    Patient will be discharged to skilled nursing facility-[facility].  Patient will need to follow up with primary care provider within one week of discharge.  Follow up with neurology in 6 weeks.  Follow up with vascular surgery in 2-3 weeks. Patient should continue medications as prescribed.  Patient should follow a Heart Healthy Carb Modified Dysphagia 1 Nectar Thick Liquid diet.     Allergies as of 12/30/2016   No Known Allergies     Medication List    TAKE these medications   acetaminophen 500 MG tablet Commonly known as:  TYLENOL Take 500 mg by mouth every 6 (six) hours as needed for mild pain or moderate pain.   amLODipine 2.5 MG tablet Commonly known as:  NORVASC Take 2.5 mg by mouth daily.   aspirin 325 MG tablet Take 1 tablet (325 mg total) by mouth daily.   atorvastatin 40 MG tablet Commonly known as:  LIPITOR Take 1 tablet (40 mg total)  by mouth daily at 6 PM.   bisacodyl 5 MG EC tablet Commonly known as:  DULCOLAX Take 5 mg by mouth daily.   cephALEXin 500 MG capsule Commonly known as:  KEFLEX Take 1 capsule (500 mg total) by mouth 2 (two) times daily.   clopidogrel 75 MG tablet Commonly known as:  PLAVIX Take 1 tablet (75 mg total) by mouth daily.   feeding supplement (ENSURE ENLIVE) Liqd Take 237 mLs by mouth 2 (two) times daily between meals.   multivitamin with minerals Tabs tablet Take 1 tablet by mouth daily.   RESOURCE THICKENUP CLEAR Powd Use as needed   TRADJENTA 5 MG Tabs tablet Generic drug:  linagliptin Take 5 mg by mouth daily.            Discharge Care Instructions        Start     Ordered   12/30/16 0000  aspirin 325 MG tablet  Daily     12/30/16 1203   12/30/16 0000  atorvastatin (LIPITOR) 40 MG tablet  Daily-1800     12/30/16 1203   12/30/16 0000  clopidogrel (PLAVIX) 75 MG tablet  Daily     12/30/16 1203   12/30/16 0000  feeding supplement, ENSURE ENLIVE, (ENSURE ENLIVE) LIQD  2 times daily between meals     12/30/16 1203   12/30/16 0000  Multiple Vitamin (MULTIVITAMIN WITH MINERALS) TABS tablet  Daily     12/30/16 1203   12/30/16 0000  Maltodextrin-Xanthan Gum (RESOURCE THICKENUP CLEAR) POWD     12/30/16 1203   12/30/16 0000  Discharge instructions    Comments:  Patient will be discharged to skilled nursing facility-[facility].  Patient will need to follow up with primary care provider within one week of discharge.  Follow up with neurology in 6 weeks.  Follow up with vascular surgery in 2-3 weeks. Patient should continue medications as prescribed.  Patient should follow a Heart Healthy Carb Modified Dysphagia 1 Nectar Thick Liquid diet.   12/30/16 1203   12/30/16 0000  cephALEXin (KEFLEX) 500 MG capsule  2 times daily     12/30/16 1203   12/27/16 0000  Ambulatory referral to Neurology    Comments:  Follow up with stroke clinic Darrol Angel preferred, if not  available, then consider Sylvie Farrier, [facility] or Lucia Gaskins whoever is available) at [facility] in about 6-8 weeks. Thanks.   12/27/16 2253      Contact information for follow-up providers    Nilda Riggs, NP. Schedule an appointment as soon as possible for a visit in 6 week(s).   Specialty:  Family Medicine Contact information: [address] [place] [address] [phone]        Jerry Mires, MD. Schedule an appointment as soon as possible for a visit in 1 week(s).   Specialty:  Family Medicine Why:  Hospital follow up Contact information: [address] [place] [address] [phone]        Nada Libman, MD. Schedule an appointment as soon as possible for a visit in 3 week(s).   Specialties:  Vascular Surgery, Cardiology Why:  Hospital follow up Contact information: [address] [place] [address] [phone]            Contact information for after-discharge care    Destination    [facility] .   Specialty:  Skilled Nursing Facility Contact information: [address] [place] [address] [phone]                 No Known Allergies  Consultations:  Neurology  Procedures/Studies: Ct Chest Wo Contrast  Result Date: 12/25/2016 CLINICAL DATA:  Altered mental status. Interstitial lung disease. Increased weakness to the left side and now has difficulty moving the left side. EXAM: CT CHEST WITHOUT CONTRAST TECHNIQUE: Multidetector CT imaging of the chest was performed following the standard protocol without IV contrast. COMPARISON:  07/08/2004 FINDINGS: Cardiovascular: Normal heart size. No pericardial effusion. Coronary artery and aortic calcifications. Diffusely ectatic thoracic aorta. Ascending aorta measures 3.6 cm diameter. Descending aorta measures 3.2 cm diameter. Mediastinum/Nodes: Esophagus is decompressed. Scattered mediastinal lymph nodes are not pathologically enlarged. No abnormal  mediastinal fluid collection or gas. Lungs/Pleura: There is chronic left apical pleural thickening, scarring, and volume loss in the left upper lung with scarring and honeycomb changes. This appearance is similar to the previous study without significant progression. Scarring and emphysematous changes also in the right upper lung as before. No definite evidence of any developing mass or acute consolidation. No pleural effusions. No pneumothorax. Diffuse emphysematous changes throughout the lungs. Upper Abdomen: Configuration of the liver suggest possible hepatic cirrhosis. Diffuse calcification throughout the pancreas consistent with chronic pancreatitis. No acute process suggested in the visualized upper abdomen. Musculoskeletal: Degenerative changes in the spine. No destructive bone lesions. IMPRESSION: 1. Diffuse emphysematous change throughout the lungs. Chronic bilateral apical scarring, chronic pleural  thickening and volume loss in the left upper lung with scarring and honeycomb changes. No significant change since 2006. No evidence of acute consolidation. 2. A ectatic thoracic aorta. Aortic and coronary artery calcifications. 3. Probable changes of hepatic cirrhosis. Pancreatic calcification consistent with chronic pancreatitis. Aortic Atherosclerosis (ICD10-I70.0) and Emphysema (ICD10-J43.9). Electronically Signed   By: Burman Nieves M.D.   On: 12/25/2016 21:09   Mr Brain Wo Contrast  Result Date: 12/26/2016 CLINICAL DATA:  Ataxia, LEFT-sided weakness. Follow-up possible acute stroke. History of hypertension, diabetes. EXAM: MRI HEAD WITHOUT CONTRAST MRA HEAD WITHOUT CONTRAST TECHNIQUE: Multiplanar, multiecho pulse sequences of the brain and surrounding structures were obtained without intravenous contrast. Angiographic images of the head were obtained using MRA technique without contrast. COMPARISON:  CT HEAD December 25, 2016 FINDINGS: MRI HEAD FINDINGS BRAIN: Patchy reduced diffusion in RIGHT  frontal, RIGHT parietal, RIGHT occipital lobe laterally with low ADC values. Chronic microhemorrhage LEFT frontal lobe. Faint mineralization associated with old small pontine and cerebellar infarcts. Old bilateral basal ganglia and thalamus infarcts. Prominent basal ganglia and thalamus perivascular spaces associated with chronic hypertension. Patchy to confluent supratentorial white matter FLAIR T2 hyperintensities. No midline shift, mass effect or masses. Small area LEFT frontal encephalomalacia. VASCULAR: Normal major intracranial vascular flow voids present at skull base. SKULL AND UPPER CERVICAL SPINE: No abnormal sellar expansion. No suspicious calvarial bone marrow signal. Craniocervical junction maintained. SINUSES/ORBITS: The mastoid air-cells and included paranasal sinuses are well-aerated. The included ocular globes and orbital contents are non-suspicious. OTHER: Patient is edentulous. MRA HEAD FINDINGS ANTERIOR CIRCULATION: Normal flow related enhancement of the included cervical, petrous, cavernous and supraclinoid internal carotid arteries. Patent anterior communicating artery, 2 mm medially directed LEFT A1-2 junction aneurysm. Mild stenosis RIGHT proximal M1 segment. Patent anterior and middle cerebral arteries, including distal segments. No large vessel occlusion, high-grade stenosis . POSTERIOR CIRCULATION: Codominant vertebral artery's, mild stenosis distal LEFT V4 segment. Basilar artery is patent, with normal flow related enhancement of the main branch vessels. Patent posterior cerebral arteries. Robust bilateral posterior communicating arteries present. Mild tandem stenoses bilateral posterior cerebral artery's. No large vessel occlusion, high-grade stenosis,  aneurysm. ANATOMIC VARIANTS: None. Source images and MIP images were reviewed. IMPRESSION: MRI HEAD: 1. Acute patchy RIGHT MCA territory and RIGHT posterior watershed territory infarcts. 2. Old small LEFT frontal lobe/MCA territory  infarct. 3. Multiple old small basal ganglia, thalamus, pontine and cerebellar infarcts. 4. Moderate to severe chronic small vessel ischemic disease. MRA HEAD: 1. No emergent large vessel occlusion or flow limiting stenosis. Given above MRI findings, recommend angiographic imaging of the neck to evaluate for proximal stenosis. 2. Mild intracranial atherosclerosis. 3. 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 Electronically Signed   By: Awilda Metro M.D.   On: 12/26/2016 05:08   Dg Chest Portable 1 View  Result Date: 12/25/2016 CLINICAL DATA:  Cough EXAM: PORTABLE CHEST 1 VIEW COMPARISON:  07/13/2016 FINDINGS: Development of multifocal opacities within the lung apices and left base. No pleural effusion. Mild cardiomegaly. Stable slightly enlarged cardiomediastinal silhouette with tortuosity of the aortic arch. No pneumothorax. Possible left hilar density IMPRESSION: New multifocal opacities within the apical lungs and left base which may reflect multifocal pneumonia. Possible left hilar opacity, CT chest suggested for further evaluation. Electronically Signed   By: Jasmine Pang M.D.   On: 12/25/2016 20:07   Dg Swallowing Func-speech Pathology  Result Date: 12/29/2016 Objective Swallowing Evaluation: Type of Study: MBS-Modified Barium Swallow Study Patient Details Name: Jerry Lane MRN: [id number] Date of Birth:  1928-03-01 Today's Date: 12/29/2016 Time: SLP Start Time (ACUTE ONLY): 1325-SLP Stop Time (ACUTE ONLY): 1347 SLP Time Calculation (min) (ACUTE ONLY): 22 min Past Medical History: Past Medical History: Diagnosis Date . Arthritis  . Diabetes mellitus   Type II . Hypertension  . Stroke [facility])   no residual weakness Past Surgical History: Past Surgical History: Procedure Laterality Date . TIBIA FRACTURE SURGERY  1987  Right, [facility]? HPI: Pt is an 83 y/o male admitted secondary to L sided weakness. Pt found to have an acute ischemic stroke. PMH including but not limited to DM, HTN, CKD and hx of CVA with residual  L sided weakness. MRI 12/26/16 revealed acute patchy RIGHT MCA territory and RIGHT posterior watershed territory infarcts. Old small LEFT frontal lobe/MCA territory infarct, multiple old small basal ganglia, thalamus, pontine and cerebellar infarcts. Pt having difficulty masticating solids, with wet vocal quality despite passing RN swallow screen at [facility].  Subjective: pt alert, cooperative Assessment / Plan / Recommendation CH[facility] IP CLINICAL IMPRESSIONS 12/29/2016 Clinical Impression Pt has a moderate oropharyngeal dysphagia characterized by reduced bolus cohesion as well as delayed and incomplete oral transit. Premature spillage results in penetration of thin liquids before the swallow, but he also penetrates after the swallow due to residue that remains in the vallecula and pyriform sinuses due to reduced base of tongue retraction and hyolaryngeal excursion. Throat clearing occurred during episodes of penetration, but not during every instance. Cued second swallows help to reduce oropharyngeal residue, but he needs Mod cues and additional time to complete this swallow. Nectar thick liquids and purees are better contained in the interim. He also seems to have mildly reduced residue when he drinks from a straw because it more naturally puts him into a chin tuck position. Recommend to continue current diet (Dys 1 textures, nectar thick liquids), encouraging use of straws, and wroking to implement a faster, more consistent dry swallow.  SLP Visit Diagnosis Dysphagia, oropharyngeal phase (R13.12) Attention and concentration deficit following -- Frontal lobe and executive function deficit following -- Impact on safety and function Mild aspiration risk   [facility] IP TREATMENT RECOMMENDATION 12/29/2016 Treatment Recommendations Therapy as outlined in treatment plan below   Prognosis 12/29/2016 Prognosis for Safe Diet Advancement Good Barriers to Reach Goals Cognitive deficits Barriers/Prognosis Comment -- [facility] IP DIET RECOMMENDATION  12/29/2016 SLP Diet Recommendations Dysphagia 1 (Puree) solids;Nectar thick liquid Liquid Administration via Straw Medication Administration Crushed with puree Compensations Slow rate;Small sips/bites;Minimize environmental distractions;Multiple dry swallows after each bite/sip Postural Changes Remain semi-upright after after feeds/meals (Comment);Seated upright at 90 degrees   [facility] IP OTHER RECOMMENDATIONS 12/29/2016 Recommended Consults -- Oral Care Recommendations Oral care BID Other Recommendations Order thickener from pharmacy;Prohibited food (jello, ice cream, thin soups);Remove water pitcher   [facility] IP FOLLOW UP RECOMMENDATIONS 12/29/2016 Follow up Recommendations Skilled Nursing facility   [facility] IP FREQUENCY AND DURATION 12/29/2016 Speech Therapy Frequency (ACUTE ONLY) min 2x/week Treatment Duration 2 weeks      CH[facility] IP ORAL PHASE 12/29/2016 Oral Phase Impaired Oral - Pudding Teaspoon -- Oral - Pudding Cup -- Oral - Honey Teaspoon -- Oral - Honey Cup -- Oral - Nectar Teaspoon -- Oral - Nectar Cup -- Oral - Nectar Straw Reduced posterior propulsion;Lingual/palatal residue;Delayed oral transit;Decreased bolus cohesion Oral - Thin Teaspoon -- Oral - Thin Cup Reduced posterior propulsion;Delayed oral transit;Decreased bolus cohesion;Premature spillage Oral - Thin Straw Reduced posterior propulsion;Delayed oral transit;Decreased bolus cohesion;Premature spillage Oral - Puree Reduced posterior propulsion;Lingual/palatal residue;Delayed oral transit;Decreased bolus cohesion Oral - Mech Soft --  Oral - Regular -- Oral - Multi-Consistency -- Oral - Pill -- Oral Phase - Comment --  CH[facility] IP PHARYNGEAL PHASE 12/29/2016 Pharyngeal Phase Impaired Pharyngeal- Pudding Teaspoon -- Pharyngeal -- Pharyngeal- Pudding Cup -- Pharyngeal -- Pharyngeal- Honey Teaspoon -- Pharyngeal -- Pharyngeal- Honey Cup -- Pharyngeal -- Pharyngeal- Nectar Teaspoon -- Pharyngeal -- Pharyngeal- Nectar Cup -- Pharyngeal -- Pharyngeal- Nectar Straw Reduced tongue  base retraction;Pharyngeal residue - valleculae;Reduced anterior laryngeal mobility;Pharyngeal residue - pyriform Pharyngeal -- Pharyngeal- Thin Teaspoon -- Pharyngeal -- Pharyngeal- Thin Cup Reduced tongue base retraction;Pharyngeal residue - valleculae;Reduced anterior laryngeal mobility;Pharyngeal residue - pyriform;Penetration/Aspiration before swallow;Penetration/Apiration after swallow Pharyngeal Material enters airway, remains ABOVE vocal cords and not ejected out Pharyngeal- Thin Straw Reduced tongue base retraction;Pharyngeal residue - valleculae;Reduced anterior laryngeal mobility;Pharyngeal residue - pyriform;Penetration/Apiration after swallow Pharyngeal Material enters airway, remains ABOVE vocal cords and not ejected out Pharyngeal- Puree Reduced tongue base retraction;Pharyngeal residue - valleculae;Reduced anterior laryngeal mobility Pharyngeal -- Pharyngeal- Mechanical Soft -- Pharyngeal -- Pharyngeal- Regular -- Pharyngeal -- Pharyngeal- Multi-consistency -- Pharyngeal -- Pharyngeal- Pill -- Pharyngeal -- Pharyngeal Comment --  CHL IP CERVICAL ESOPHAGEAL PHASE 12/29/2016 Cervical Esophageal Phase WFL Pudding Teaspoon -- Pudding Cup -- Honey Teaspoon -- Honey Cup -- Nectar Teaspoon -- Nectar Cup -- Nectar Straw -- Thin Teaspoon -- Thin Cup -- Thin Straw -- Puree -- Mechanical Soft -- Regular -- Multi-consistency -- Pill -- Cervical Esophageal Comment -- No flowsheet data found. Maxcine Ham 12/29/2016, 2:12 PM  Maxcine Ham, M.A. CCC-SLP [phone]             Mr Maxine Glenn Head/brain QM Cm  Result Date: 12/26/2016 CLINICAL DATA:  Ataxia, LEFT-sided weakness. Follow-up possible acute stroke. History of hypertension, diabetes. EXAM: MRI HEAD WITHOUT CONTRAST MRA HEAD WITHOUT CONTRAST TECHNIQUE: Multiplanar, multiecho pulse sequences of the brain and surrounding structures were obtained without intravenous contrast. Angiographic images of the head were obtained using MRA technique without  contrast. COMPARISON:  CT HEAD December 25, 2016 FINDINGS: MRI HEAD FINDINGS BRAIN: Patchy reduced diffusion in RIGHT frontal, RIGHT parietal, RIGHT occipital lobe laterally with low ADC values. Chronic microhemorrhage LEFT frontal lobe. Faint mineralization associated with old small pontine and cerebellar infarcts. Old bilateral basal ganglia and thalamus infarcts. Prominent basal ganglia and thalamus perivascular spaces associated with chronic hypertension. Patchy to confluent supratentorial white matter FLAIR T2 hyperintensities. No midline shift, mass effect or masses. Small area LEFT frontal encephalomalacia. VASCULAR: Normal major intracranial vascular flow voids present at skull base. SKULL AND UPPER CERVICAL SPINE: No abnormal sellar expansion. No suspicious calvarial bone marrow signal. Craniocervical junction maintained. SINUSES/ORBITS: The mastoid air-cells and included paranasal sinuses are well-aerated. The included ocular globes and orbital contents are non-suspicious. OTHER: Patient is edentulous. MRA HEAD FINDINGS ANTERIOR CIRCULATION: Normal flow related enhancement of the included cervical, petrous, cavernous and supraclinoid internal carotid arteries. Patent anterior communicating artery, 2 mm medially directed LEFT A1-2 junction aneurysm. Mild stenosis RIGHT proximal M1 segment. Patent anterior and middle cerebral arteries, including distal segments. No large vessel occlusion, high-grade stenosis . POSTERIOR CIRCULATION: Codominant vertebral artery's, mild stenosis distal LEFT V4 segment. Basilar artery is patent, with normal flow related enhancement of the main branch vessels. Patent posterior cerebral arteries. Robust bilateral posterior communicating arteries present. Mild tandem stenoses bilateral posterior cerebral artery's. No large vessel occlusion, high-grade stenosis,  aneurysm. ANATOMIC VARIANTS: None. Source images and MIP images were reviewed. IMPRESSION: MRI HEAD: 1. Acute patchy  RIGHT MCA territory and RIGHT posterior watershed territory infarcts. 2. Old small LEFT frontal  lobe/MCA territory infarct. 3. Multiple old small basal ganglia, thalamus, pontine and cerebellar infarcts. 4. Moderate to severe chronic small vessel ischemic disease. MRA HEAD: 1. No emergent large vessel occlusion or flow limiting stenosis. Given above MRI findings, recommend angiographic imaging of the neck to evaluate for proximal stenosis. 2. Mild intracranial atherosclerosis. 3. 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 Electronically Signed   By: Awilda Metro M.D.   On: 12/26/2016 05:08   Ct Head Code Stroke Wo Contrast  Result Date: 12/25/2016 CLINICAL DATA:  Code stroke. Initial evaluation for increase left-sided weakness for 4 hours. EXAM: CT HEAD WITHOUT CONTRAST TECHNIQUE: Contiguous axial images were obtained from the base of the skull through the vertex without intravenous contrast. COMPARISON:  Prior CT from 07/13/2016. FINDINGS: Brain: Advanced age-related cerebral atrophy with chronic microvascular ischemic disease. Encephalomalacia within the anterior left frontal lobe compatible with remote left MCA territory infarct. Multiple remote lacunar infarcts present within the bilateral basal ganglia and thalami as well as the brainstem. Few small remote right cerebellar infarcts. There is question of a tiny approximate 1 cm hypodensity involving the cortical gray matter of the posterior left frontal lobe, precentral gyrus/ motor strip, which may reflect an evolving tiny acute ischemic infarct (series 2, image 29). Finding not entirely certain given the small size. No other definite acute large vessel territory infarct. No acute intracranial hemorrhage. No mass lesion or midline shift. No mass effect. No hydrocephalus. No extra-axial fluid collection. Vascular: No worrisome hyperdense vessel. Prominent vascular calcifications noted within the carotid siphons and distal vertebral artery's. Skull: Scalp soft  tissues and calvarium within normal limits. Sinuses/Orbits: Globes and orbital soft tissues within normal limits. Paranasal sinuses are clear. Small right mastoid effusion noted. Other: None. ASPECTS [facility] Stroke Program Early CT Score) - Ganglionic level infarction (caudate, lentiform nuclei, internal capsule, insula, M1-M3 cortex): 7 - Supraganglionic infarction (M4-M6 cortex): 3 Total score (0-10 with 10 being normal): 10 IMPRESSION: 1. Question tiny approximate 1 cm evolving hypodensity at the posterior right frontal lobe, precentral gyrus, which may reflect a tiny acute cortical ischemic infarct. Finding could be further assessed with dedicated MRI as desired. No acute intracranial hemorrhage. 2. ASPECTS is 10 3. Otherwise stable atrophy and chronic microvascular ischemic disease with multiple remote infarcts as above. Critical Value/emergent results were called by telephone at the time of interpretation on 12/25/2016 at 7:24 pm to Dr. Bethann Berkshire , who verbally acknowledged these results. Electronically Signed   By: Rise Mu M.D.   On: 12/25/2016 19:33   ECHOCARDIOGRAM ------------------------------------------------------------------- Study Conclusions  - Left ventricle: The cavity size was normal. Wall thickness was   increased in a pattern of mild LVH. Systolic function was normal.   The estimated ejection fraction was in the range of 55% to 60%.   Wall motion was normal; there were no regional wall motion   abnormalities. Doppler parameters are consistent with abnormal   left ventricular relaxation (grade 1 diastolic dysfunction). - Aortic valve: Mildly calcified annulus. Trileaflet; mildly   calcified leaflets. There was trivial regurgitation. - Aortic root: The aortic root was mildly ectatic. - Mitral valve: Mildly calcified annulus. There was trivial   regurgitation. - Atrial septum: No defect or patent foramen ovale was identified. - Tricuspid valve: There was trivial  regurgitation. - Pulmonary arteries: PA peak pressure: 24 mm Hg (S). - Pericardium, extracardiac: There was no pericardial effusion.  Impressions:  - Mild LVH with LVEF 55-60% and grade 1 diastolic dysfunction.   Mildly sclerotic aortic  valve with trivial aortic regurgitation.   Trivial mitral regurgitation. Trivial tricuspid regurgitation   with PASP 24 mmHg. No obvious PFO or ASD.  CAROTID DOPPLERS Preliminary findings: Right 60-79% ICA stenosis. Left 1-39% ICA stenosis.  Subjective: Seen and examined and wanted to sleep. No nausea or vomiting. Removed IV x2 and did not want it back in. No other concerns or complaints and ready to go to SNF.   Discharge Exam: Vitals:   12/29/16 2108 12/30/16 0525  BP: 136/86 135/70  Pulse: 64 67  Resp: 17 17  Temp: 98.6 F (37 C) 98.4 F (36.9 C)  SpO2: 100% 100%   Vitals:   12/29/16 0308 12/29/16 1453 12/29/16 2108 12/30/16 0525  BP: 140/65 138/68 136/86 135/70  Pulse: 79 80 64 67  Resp: Temp: 97.8 F (36.6 C) 98.2 F (36.8 C) 98.6 F (37 C) 98.4 F (36.9 C)  TempSrc: Oral Oral Oral Oral  SpO2: 100% 100% 100% 100%  Weight:      Height:       General: Pt is alert, awake, not in acute distress Cardiovascular: RRR, S1/S2 +, no rubs, no gallops Respiratory: CTA bilaterally, no wheezing, no rhonchi Abdominal: Soft, NT, ND, bowel sounds + Extremities: no edema, no cyanosis  The results of significant diagnostics from this hospitalization (including imaging, microbiology, ancillary and laboratory) are listed below for reference.    Microbiology: Recent Results (from the past 240 hour(s))  Urine Culture     Status: Abnormal   Collection Time: 12/25/16  8:03 PM  Result Value Ref Range Status   Specimen Description URINE, CLEAN CATCH  Final   Special Requests NONE  Final   Culture >=100,000 COLONIES/mL ESCHERICHIA COLI (A)  Final   Report Status 12/28/2016 FINAL  Final   Organism ID, Bacteria ESCHERICHIA COLI (A)   Final      Susceptibility   Escherichia coli - MIC*    AMPICILLIN <=2 SENSITIVE Sensitive     CEFAZOLIN <=4 SENSITIVE Sensitive     CEFTRIAXONE <=1 SENSITIVE Sensitive     CIPROFLOXACIN <=0.25 SENSITIVE Sensitive     GENTAMICIN <=1 SENSITIVE Sensitive     IMIPENEM <=0.25 SENSITIVE Sensitive     NITROFURANTOIN <=16 SENSITIVE Sensitive     TRIMETH/SULFA <=20 SENSITIVE Sensitive     AMPICILLIN/SULBACTAM <=2 SENSITIVE Sensitive     PIP/TAZO <=4 SENSITIVE Sensitive     Extended ESBL NEGATIVE Sensitive     * >=100,000 COLONIES/mL ESCHERICHIA COLI  Culture, blood (Routine X 2) w Reflex to ID Panel     Status: None   Collection Time: 12/25/16 10:35 PM  Result Value Ref Range Status   Specimen Description RIGHT ANTECUBITAL  Final   Special Requests   Final    BOTTLES DRAWN AEROBIC AND ANAEROBIC Blood Culture adequate volume   Culture NO GROWTH 5 DAYS  Final   Report Status 12/30/2016 FINAL  Final  Culture, blood (Routine X 2) w Reflex to ID Panel     Status: None   Collection Time: 12/25/16 10:49 PM  Result Value Ref Range Status   Specimen Description BLOOD LEFT HAND  Final   Special Requests   Final    BOTTLES DRAWN AEROBIC ONLY Blood Culture results may not be optimal due to an inadequate volume of blood received in culture bottles   Culture NO GROWTH 5 DAYS  Final   Report Status 12/30/2016 FINAL  Final  MRSA PCR Screening     Status: None  Collection Time: 12/26/16 11:02 PM  Result Value Ref Range Status   MRSA by PCR NEGATIVE NEGATIVE Final    Comment:        The GeneXpert MRSA Assay (FDA approved for NASAL specimens only), is one component of a comprehensive MRSA colonization surveillance program. It is not intended to diagnose MRSA infection nor to guide or monitor treatment for MRSA infections.     Labs: BNP (last 3 results) No results for input(s): BNP in the last 8760 hours. Basic Metabolic Panel:  Recent Labs Lab 12/26/16 1917 12/27/16 0502 12/28/16 0604  12/29/16 1131 12/29/16 1552 12/30/16 0405  NA 136 137 138 139  --  142  K 5.6* 4.0 5.4* 6.0* 4.8 4.6  CL 110 109 111 112*  --  110  CO2 14* 18* 18* 17*  --  20*  GLUCOSE 97 107* 99 122*  --  106*  BUN 48* 41* 37* 33*  --  27*  CREATININE 3.10* 2.84* 2.61* 2.42*  --  2.30*  CALCIUM 9.0 9.0 8.8* 8.8*  --  8.7*  MG 2.0  --   --   --   --   --    Liver Function Tests:  Recent Labs Lab 12/25/16 1905  AST 20  ALT 18  ALKPHOS 66  BILITOT 0.4  PROT 8.1  ALBUMIN 3.5   No results for input(s): LIPASE, AMYLASE in the last 168 hours. No results for input(s): AMMONIA in the last 168 hours. CBC:  Recent Labs Lab 12/25/16 1905  12/26/16 0535 12/27/16 0502 12/28/16 0604 12/29/16 1131 12/30/16 0405  WBC 5.9  --  6.0 5.3 4.6 5.3 4.0  NEUTROABS 4.6  --   --   --   --   --   --   HGB 10.6*  < > 10.9* 11.1* 10.8* 11.4* 11.0*  HCT 32.5*  < > 34.1* 35.5* 34.9* 34.6* 34.8*  MCV 85.8  --  85.7 86.4 86.8 86.3 86.4  PLT 398  --  409* 463* 370 354 344  < > = values in this interval not displayed. Cardiac Enzymes: No results for input(s): CKTOTAL, CKMB, CKMBINDEX, TROPONINI in the last 168 hours. BNP: Invalid input(s): POCBNP CBG:  Recent Labs Lab 12/29/16 0752 12/29/16 1226 12/29/16 1619 12/29/16 2204 12/30/16 0804  GLUCAP 75 115* 172* 105* 89   D-Dimer No results for input(s): DDIMER in the last 72 hours. Hgb A1c No results for input(s): HGBA1C in the last 72 hours. Lipid Profile No results for input(s): CHOL, HDL, LDLCALC, TRIG, CHOLHDL, LDLDIRECT in the last 72 hours. Thyroid function studies No results for input(s): TSH, T4TOTAL, T3FREE, THYROIDAB in the last 72 hours.  Invalid input(s): FREET3 Anemia work up No results for input(s): VITAMINB12, FOLATE, FERRITIN, TIBC, IRON, RETICCTPCT in the last 72 hours. Urinalysis    Component Value Date/Time   COLORURINE YELLOW 12/25/2016 2003   APPEARANCEUR HAZY (A) 12/25/2016 2003   LABSPEC 1.015 12/25/2016 2003   PHURINE  5.0 12/25/2016 2003   GLUCOSEU NEGATIVE 12/25/2016 2003   HGBUR NEGATIVE 12/25/2016 2003   BILIRUBINUR NEGATIVE 12/25/2016 2003   KETONESUR NEGATIVE 12/25/2016 2003   PROTEINUR 30 (A) 12/25/2016 2003   UROBILINOGEN 0.2 10/11/2006 1026   NITRITE NEGATIVE 12/25/2016 2003   LEUKOCYTESUR MODERATE (A) 12/25/2016 2003   Sepsis Labs Invalid input(s): PROCALCITONIN,  WBC,  LACTICIDVEN Microbiology Recent Results (from the past 240 hour(s))  Urine Culture     Status: Abnormal   Collection Time: 12/25/16  8:03 PM  Result  Value Ref Range Status   Specimen Description URINE, CLEAN CATCH  Final   Special Requests NONE  Final   Culture >=100,000 COLONIES/mL ESCHERICHIA COLI (A)  Final   Report Status 12/28/2016 FINAL  Final   Organism ID, Bacteria ESCHERICHIA COLI (A)  Final      Susceptibility   Escherichia coli - MIC*    AMPICILLIN <=2 SENSITIVE Sensitive     CEFAZOLIN <=4 SENSITIVE Sensitive     CEFTRIAXONE <=1 SENSITIVE Sensitive     CIPROFLOXACIN <=0.25 SENSITIVE Sensitive     GENTAMICIN <=1 SENSITIVE Sensitive     IMIPENEM <=0.25 SENSITIVE Sensitive     NITROFURANTOIN <=16 SENSITIVE Sensitive     TRIMETH/SULFA <=20 SENSITIVE Sensitive     AMPICILLIN/SULBACTAM <=2 SENSITIVE Sensitive     PIP/TAZO <=4 SENSITIVE Sensitive     Extended ESBL NEGATIVE Sensitive     * >=100,000 COLONIES/mL ESCHERICHIA COLI  Culture, blood (Routine X 2) w Reflex to ID Panel     Status: None   Collection Time: 12/25/16 10:35 PM  Result Value Ref Range Status   Specimen Description RIGHT ANTECUBITAL  Final   Special Requests   Final    BOTTLES DRAWN AEROBIC AND ANAEROBIC Blood Culture adequate volume   Culture NO GROWTH 5 DAYS  Final   Report Status 12/30/2016 FINAL  Final  Culture, blood (Routine X 2) w Reflex to ID Panel     Status: None   Collection Time: 12/25/16 10:49 PM  Result Value Ref Range Status   Specimen Description BLOOD LEFT HAND  Final   Special Requests   Final    BOTTLES DRAWN AEROBIC  ONLY Blood Culture results may not be optimal due to an inadequate volume of blood received in culture bottles   Culture NO GROWTH 5 DAYS  Final   Report Status 12/30/2016 FINAL  Final  MRSA PCR Screening     Status: None   Collection Time: 12/26/16 11:02 PM  Result Value Ref Range Status   MRSA by PCR NEGATIVE NEGATIVE Final    Comment:        The GeneXpert MRSA Assay (FDA approved for NASAL specimens only), is one component of a comprehensive MRSA colonization surveillance program. It is not intended to diagnose MRSA infection nor to guide or monitor treatment for MRSA infections.    Time coordinating discharge: 35 minutes  SIGNED:  Merlene Laughter, DO [facility] 12/30/2016, 12:03 PM Pager [phone]  If 7PM-7AM, please contact night-coverage [email or web address] Password [email or web address]

## 2016-12-30 NOTE — Clinical Social Work Placement (Signed)
   CLINICAL SOCIAL WORK PLACEMENT  NOTE  Date:  12/30/2016  Patient Details  Name: Jerry Lane MRN: [id number] Date of Birth: 06-Mar-1928  Clinical Social Work is seeking post-discharge placement for this patient at the Skilled  Nursing Facility level of care (*CSW will initial, date and re-position this form in  chart as items are completed):  Yes   Patient/family provided with [facility] Clinical Social Work Department's list of facilities offering this level of care within the geographic area requested by the patient (or if unable, by the patient's family).  Yes   Patient/family informed of their freedom to choose among providers that offer the needed level of care, that participate in Medicare, Medicaid or managed care program needed by the patient, have an available bed and are willing to accept the patient.  Yes   Patient/family informed of [facility]'s ownership interest in [facility] and [facility], as well as of the fact that they are under no obligation to receive care at these facilities.  PASRR submitted to EDS on       PASRR number received on       Existing PASRR number confirmed on 12/28/16     FL2 transmitted to all facilities in geographic area requested by pt/family on 12/28/16     FL2 transmitted to all facilities within larger geographic area on       Patient informed that his/her managed care company has contracts with or will negotiate with certain facilities, including the following:        Yes   Patient/family informed of bed offers received.  Patient chooses bed at [facility]     Physician recommends and patient chooses bed at      Patient to be transferred to [facility] at [place] on 12/30/16.  Patient to be transferred to facility by [facility]     Patient family notified on 12/30/16 of transfer.  Name of family member notified:  Sister     PHYSICIAN Please prepare priority discharge summary, including medications     Additional  Comment:    _______________________________________________ Mearl Latin, LCSWA 12/30/2016, 11:33 AM

## 2016-12-30 NOTE — Progress Notes (Signed)
Patient was discharged to nursing home ([facility] in [place])  by MD order; discharged instructions  review and sent to facility with care notes; IV DIC; patient will be transported to facility via [facility]. Facility was called and report was given to the nurse who is going to receive the patient.

## 2016-12-30 NOTE — Progress Notes (Signed)
Occupational Therapy Treatment Patient Details Name: Jerry Lane MRN: [id number] DOB: 11-13-1927 Today's Date: 12/30/2016    History of present illness Pt is an 83 y/o male admitted secondary to L sided weakness. Pt found to have an acute ischemic stroke. PMH including but not limited to DM, HTN, CKD and hx of CVA with residual L sided weakness.   OT comments  Pt progressing towards OT goals this session, able to participate in seated grooming task after max A +2 transfer to recliner. COntinue to monitor Pt's LUE for appropriateness for resting hand splint. Pt continues to benefit from skilled OT in the acute setting and afterwards at the SNF level (pending discharge later today).  Follow Up Recommendations  SNF;Supervision/Assistance - 24 hour    Equipment Recommendations  Other (comment) (defer to next venue)    Recommendations for Other Services      Precautions / Restrictions Precautions Precautions: Fall Restrictions Weight Bearing Restrictions: No       Mobility Bed Mobility Overal bed mobility: Needs Assistance Bed Mobility: Supine to Sit     Supine to sit: Mod assist;+2 for physical assistance     General bed mobility comments: assist and increased time for legs off bed, assist to lift trunk upright, and use of bed pad to bring hips EOB  Transfers Overall transfer level: Needs assistance Equipment used: 2 person hand held assist Transfers: Sit to/from [employer] Sit to Stand: Mod assist;+2 safety/equipment Stand pivot transfers: Max assist;+2 physical assistance       General transfer comment: Pt required assist for boost up, and UE support on Right    Balance Overall balance assessment: Needs assistance Sitting-balance support: Feet supported Sitting balance-Leahy Scale: Fair Sitting balance - Comments: reliant on R UE support   Standing balance support: During functional activity Standing balance-Leahy Scale: Poor Standing balance  comment: mod A for standing balance                           ADL either performed or assessed with clinical judgement   ADL Overall ADL's : Needs assistance/impaired     Grooming: Wash/dry hands;Wash/dry face;Minimal assistance;Cueing for sequencing;Cueing for compensatory techniques;Sitting Grooming Details (indicate cue type and reason): in recliner                 Toilet Transfer: Stand-pivot;Maximal assistance;+2 for physical assistance Toilet Transfer Details (indicate cue type and reason): simulated to recliner                 Vision       Perception     Praxis      Cognition Arousal/Alertness: Awake/alert Behavior During Therapy: WFL for tasks assessed/performed Overall Cognitive Status: Impaired/Different from baseline Area of Impairment: Orientation;Memory;Safety/judgement;Problem solving;Following commands                 Orientation Level: Disoriented to;Place;Time;Situation   Memory: Decreased short-term memory Following Commands: Follows one step commands with increased time Safety/Judgement: Decreased awareness of deficits   Problem Solving: Slow processing;Decreased initiation;Difficulty sequencing;Requires verbal cues;Requires tactile cues          Exercises     Shoulder Instructions       General Comments      Pertinent Vitals/ Pain       Pain Assessment: No/denies pain Faces Pain Scale: No hurt  Home Living  Prior Functioning/Environment              Frequency  Min 2X/week        Progress Toward Goals  OT Goals(current goals can now be found in the care plan section)  Progress towards OT goals: Progressing toward goals  Acute Rehab OT Goals Patient Stated Goal: none stated OT Goal Formulation: With patient Time For Goal Achievement: 01/09/17 Potential to Achieve Goals: Good  Plan Discharge plan remains appropriate;Frequency remains  appropriate;Other (comment) (SNF might want to look at LUE resting hand splint)    Co-evaluation                 AM-PAC PT "6 Clicks" Daily Activity     Outcome Measure   Help from another person eating meals?: A Little Help from another person taking care of personal grooming?: A Little Help from another person toileting, which includes using toliet, bedpan, or urinal?: A Lot Help from another person bathing (including washing, rinsing, drying)?: A Lot Help from another person to put on and taking off regular upper body clothing?: A Lot Help from another person to put on and taking off regular lower body clothing?: A Lot 6 Click Score: 14    End of Session Equipment Utilized During Treatment: Gait belt  OT Visit Diagnosis: Unsteadiness on feet (R26.81);Other abnormalities of gait and mobility (R26.89);Muscle weakness (generalized) (M62.81);History of falling (Z91.81);Hemiplegia and hemiparesis;Other symptoms and signs involving cognitive function Hemiplegia - Right/Left: Left Hemiplegia - dominant/non-dominant: Non-Dominant Hemiplegia - caused by: Cerebral infarction   Activity Tolerance Patient tolerated treatment well;Patient limited by fatigue   Patient Left in chair;with call bell/phone within reach;with chair alarm set   Nurse Communication Mobility status        Time: 1610-9604 OT Time Calculation (min): 21 min  Charges: OT General Charges $OT Visit: 1 Visit OT Treatments $Self Care/Home Management : 8-22 mins  Sherryl Manges OTR/L [phone]  Jerry Lane 12/30/2016, 5:07 PM

## 2016-12-31 DIAGNOSIS — Z8673 Personal history of transient ischemic attack (TIA), and cerebral infarction without residual deficits: Secondary | ICD-10-CM | POA: Diagnosis not present

## 2016-12-31 DIAGNOSIS — G8194 Hemiplegia, unspecified affecting left nondominant side: Secondary | ICD-10-CM | POA: Diagnosis not present

## 2016-12-31 DIAGNOSIS — L89159 Pressure ulcer of sacral region, unspecified stage: Secondary | ICD-10-CM | POA: Diagnosis not present

## 2016-12-31 DIAGNOSIS — I679 Cerebrovascular disease, unspecified: Secondary | ICD-10-CM | POA: Diagnosis not present

## 2017-01-04 DIAGNOSIS — I69354 Hemiplegia and hemiparesis following cerebral infarction affecting left non-dominant side: Secondary | ICD-10-CM | POA: Diagnosis not present

## 2017-01-04 DIAGNOSIS — N390 Urinary tract infection, site not specified: Secondary | ICD-10-CM | POA: Diagnosis not present

## 2017-01-04 DIAGNOSIS — I10 Essential (primary) hypertension: Secondary | ICD-10-CM | POA: Diagnosis not present

## 2017-01-04 DIAGNOSIS — E1122 Type 2 diabetes mellitus with diabetic chronic kidney disease: Secondary | ICD-10-CM | POA: Diagnosis not present

## 2017-01-18 DIAGNOSIS — I10 Essential (primary) hypertension: Secondary | ICD-10-CM | POA: Diagnosis not present

## 2017-01-18 DIAGNOSIS — E1122 Type 2 diabetes mellitus with diabetic chronic kidney disease: Secondary | ICD-10-CM | POA: Diagnosis not present

## 2017-01-18 DIAGNOSIS — I69354 Hemiplegia and hemiparesis following cerebral infarction affecting left non-dominant side: Secondary | ICD-10-CM | POA: Diagnosis not present

## 2017-01-18 DIAGNOSIS — N183 Chronic kidney disease, stage 3 (moderate): Secondary | ICD-10-CM | POA: Diagnosis not present

## 2017-01-19 DIAGNOSIS — I10 Essential (primary) hypertension: Secondary | ICD-10-CM | POA: Diagnosis not present

## 2017-01-19 DIAGNOSIS — G8194 Hemiplegia, unspecified affecting left nondominant side: Secondary | ICD-10-CM | POA: Diagnosis not present

## 2017-01-19 DIAGNOSIS — Z8673 Personal history of transient ischemic attack (TIA), and cerebral infarction without residual deficits: Secondary | ICD-10-CM | POA: Diagnosis not present

## 2017-01-19 DIAGNOSIS — I679 Cerebrovascular disease, unspecified: Secondary | ICD-10-CM | POA: Diagnosis not present

## 2017-02-09 DIAGNOSIS — I10 Essential (primary) hypertension: Secondary | ICD-10-CM | POA: Diagnosis not present

## 2017-02-09 DIAGNOSIS — E785 Hyperlipidemia, unspecified: Secondary | ICD-10-CM | POA: Diagnosis not present

## 2017-02-09 DIAGNOSIS — I679 Cerebrovascular disease, unspecified: Secondary | ICD-10-CM | POA: Diagnosis not present

## 2017-02-17 ENCOUNTER — Encounter: Payer: Self-pay | Admitting: Neurology

## 2017-02-17 ENCOUNTER — Ambulatory Visit (INDEPENDENT_AMBULATORY_CARE_PROVIDER_SITE_OTHER): Payer: Medicare Other | Admitting: Neurology

## 2017-02-17 VITALS — BP 134/69 | HR 58 | Ht 68.0 in | Wt 145.0 lb

## 2017-02-17 DIAGNOSIS — I10 Essential (primary) hypertension: Secondary | ICD-10-CM

## 2017-02-17 DIAGNOSIS — Z8673 Personal history of transient ischemic attack (TIA), and cerebral infarction without residual deficits: Secondary | ICD-10-CM

## 2017-02-17 DIAGNOSIS — N183 Chronic kidney disease, stage 3 (moderate): Secondary | ICD-10-CM | POA: Diagnosis not present

## 2017-02-17 DIAGNOSIS — I63231 Cerebral infarction due to unspecified occlusion or stenosis of right carotid arteries: Secondary | ICD-10-CM

## 2017-02-17 DIAGNOSIS — E785 Hyperlipidemia, unspecified: Secondary | ICD-10-CM

## 2017-02-17 DIAGNOSIS — I63239 Cerebral infarction due to unspecified occlusion or stenosis of unspecified carotid arteries: Secondary | ICD-10-CM

## 2017-02-17 DIAGNOSIS — E1122 Type 2 diabetes mellitus with diabetic chronic kidney disease: Secondary | ICD-10-CM

## 2017-02-17 NOTE — Progress Notes (Signed)
STROKE NEUROLOGY FOLLOW UP NOTE  NAME: Jerry Lane DOB: 1927/12/04  REASON FOR VISIT: stroke follow up HISTORY FROM: chart   Today we had the pleasure of seeing Jerry Lane in follow-up at our Neurology Clinic. Pt was accompanied by sister.   History Summary [name] is a 83 y.o. male with history of diabetes, hypertension, emphysema, CKD, and previous strokes  with residual left spastic hemiparesis admitted on 12/25/16 for increased left-sided weakness.  MRI showed acute patchy right MCA territory and right posterior watershed territory infarcts.  Also chronic lacunar infarcts in bilateral basal ganglia and thalami and brainstem as well as a few small remote right cerebellar infarcts.  MRA head unremarkable, carotid Doppler right ICA 60-79% stenosis.  EF 55-60%.  LDL 102 and A1c 7.1.  He stroke likely embolic from high-grade stenosis of right proximal ICA.  He was put on dual antiplatelet and Lipitor.  Not good candidate for CEA procedure due to advanced age, poor baseline and cognitive impairment.  Also had UTI treated with Rocephin.  CKD stable.  Discharge to SNF with left hemiplegia.  Interval History During the interval time, the patient has been doing much better.  Left-sided plegia much improved, able to walk with walker in skilled nursing facility although came in today with wheelchair.  Not sure about BP and glucose control in the facility.  On DAPT and Lipitor without side effect.  BP today in clinic 134/69.  REVIEW OF SYSTEMS: Full 14 system review of systems performed and notable only for those listed below and in HPI above, all others are negative:  Constitutional:   Cardiovascular:  Ear/Nose/Throat:   Skin:  Eyes:   Respiratory:   Gastroitestinal:   Genitourinary:  Hematology/Lymphatic:   Endocrine:  Musculoskeletal:   Allergy/Immunology:   Neurological:   Psychiatric:  Sleep:   The following represents the patient's updated allergies and side effects  list: No Known Allergies  The neurologically relevant items on the patient's problem list were reviewed on today's visit.  Neurologic Examination  A problem focused neurological exam (12 or more points of the single system neurologic examination, vital signs counts as 1 point, cranial nerves count for 8 points) was performed.  Blood pressure 134/69, pulse (!) 58, height 5\' 8"  (1.727 m), weight 145 lb (65.8 kg).  General -cachectic, well developed, in no apparent distress.  Ophthalmologic - Fundi not visualized due to noncooperation.  Cardiovascular - Regular rate and rhythm with no murmur.  Mental Status -  Level of arousal and orientation to month, place, and person were intact, but not orientated to year. Language including expression, naming, repetition, comprehension was assessed and found grossly intact, however moderate dysarthria.  Cranial Nerves II - XII - II - Visual field intact OU. III, IV, VI - Extraocular movements intact. V - Facial sensation intact bilaterally. VII - left facial droop. VIII - Hard of hearing & vestibular intact bilaterally. X - Palate elevates symmetrically. XI - Chin turning & shoulder shrug intact bilaterally. XII - Tongue protrusion intact.  Motor Strength - The patient's strength was normal in RUE and RLE, 3/5 LUE with increased muscle tone and contracture at left elbow and limited ROM at left shoulder, LLE 4/5 proximal and distal.  Bulk was normal and fasciculations were absent.   Motor Tone - Muscle tone was assessed at the neck and appendages and was increased on the left UE  Reflexes - The patient's reflexes were 1+ in all extremities and he had  no pathological reflexes.  Sensory - Light touch, temperature/pinprick were assessed and were normal.    Coordination - The patient had normal movements in the right hand with no ataxia or dysmetria.  Tremor was absent.  Gait and Station -in a wheelchair not tested   Functional score  mRS =  3   0 - No symptoms.   1 - No significant disability. Able to carry out all usual activities, despite some symptoms.   2 - Slight disability. Able to look after own affairs without assistance, but unable to carry out all previous activities.   3 - Moderate disability. Requires some help, but able to walk unassisted.   4 - Moderately severe disability. Unable to attend to own bodily needs without assistance, and unable to walk unassisted.   5 - Severe disability. Requires constant nursing care and attention, bedridden, incontinent.   6 - Dead.   NIH Stroke Scale   Level Of Consciousness 0=Alert; keenly responsive 1=Not alert, but arousable by minor stimulation 2=Not alert, requires repeated stimulation 3=Responds only with reflex movements 0  LOC Questions to Month and Age 4=Answers both questions correctly 1=Answers one question correctly 2=Answers neither question correctly 0  LOC Commands      -Open/Close eyes     -Open/close grip 0=Performs both tasks correctly 1=Performs one task correctly 2=Performs neighter task correctly 0  Best Gaze 0=Normal 1=Partial gaze palsy 2=Forced deviation, or total gaze paresis 0  Visual 0=No visual loss 1=Partial hemianopia 2=Complete hemianopia 3=Bilateral hemianopia (blind including cortical blindness) 0  Facial Palsy 0=Normal symmetrical movement 1=Minor paralysis (asymmetry) 2=Partial paralysis (lower face) 3=Complete paralysis (upper and lower face) 1  Motor  0=No drift, limb holds posture for full 10 seconds 1=Drift, limb holds posture, no drift to bed 2=Some antigravity effort, cannot maintain posture, drifts to bed 3=No effort against gravity, limb falls 4=No movement Right Arm 0     Leg 0    Left Arm 1     Leg 1  Limb Ataxia 0=Absent 1=Present in one limb 2=Present in two limbs 0  Sensory 0=Normal 1=Mild to moderate sensory loss 2=Severe to total sensory loss 0  Best Language 0=No aphasia, normal 1=Mild to moderate  aphasia 2=Mute, global aphasia 3=Mute, global aphasia 0  Dysarthria 0=Normal 1=Mild to moderate 2=Severe, unintelligible or mute/anarthric 1  Extinction/Neglect 0=No abnormality 1=Extinction to bilateral simultaneous stimulation 2=Profound neglect 0  Total   4     Data reviewed: I personally reviewed the images and agree with the radiology interpretations.  Ct Head Code Stroke Wo Contrast 12/25/2016 IMPRESSION:  1. Question tiny approximate 1 cm evolving hypodensity at the posterior right frontal lobe, precentral gyrus, which may reflect a tiny acute cortical ischemic infarct. Finding could be further assessed with dedicated MRI as desired. No acute intracranial hemorrhage.  2. ASPECTS is 10  3. Otherwise stable atrophy and chronic microvascular ischemic disease with multiple remote infarcts as above.   Mr Brain Wo Contrast 12/26/2016 IMPRESSION:  MRI HEAD:  1. Acute patchy RIGHT MCA territory and RIGHT posterior watershed territory infarcts.  2. Old small LEFT frontal lobe/MCA territory infarct.  3. Multiple old small basal ganglia, thalamus, pontine and cerebellar infarcts.  4. Moderate to severe chronic small vessel ischemic disease.   MRA HEAD:  1. No emergent large vessel occlusion or flow limiting stenosis. Given above MRI findings, recommend angiographic imaging of the neck to evaluate for proximal stenosis.  2. Mild intracranial atherosclerosis.  3. 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  Dg Chest Portable 1 View 12/25/2016 IMPRESSION:  New multifocal opacities within the apical lungs and left base which may reflect multifocal pneumonia. Possible left hilar opacity, CT chest suggested for further evaluation.   Ct Chest Wo Contrast 12/25/2016 IMPRESSION:  1. Diffuse emphysematous change throughout the lungs. Chronic bilateral apical scarring, chronic pleural thickening and volume loss in the left upper lung with scarring and honeycomb changes. No significant change  since 2006. No evidence of acute consolidation.  2. A ectatic thoracic aorta. Aortic and coronary artery calcifications.  3. Probable changes of hepatic cirrhosis. Pancreatic calcification consistent with chronic pancreatitis. Aortic Atherosclerosis (ICD10-I70.0) and Emphysema (ICD10-J43.9).   Transthoracic Echocardiogram  12/27/2016 Study Conclusions - Left ventricle: The cavity size was normal. Wall thickness was increased in a pattern of mild LVH. Systolic function was normal. The estimated ejection fraction was in the range of 55% to 60%. Wall motion was normal; there were no regional wall motion abnormalities. Doppler parameters are consistent with abnormal left ventricular relaxation (grade 1 diastolic dysfunction). - Aortic valve: Mildly calcified annulus. Trileaflet; mildly calcified leaflets. There was trivial regurgitation. - Aortic root: The aortic root was mildly ectatic. - Mitral valve: Mildly calcified annulus. There was trivial regurgitation. - Atrial septum: No defect or patent foramen ovale was identified. - Tricuspid valve: There was trivial regurgitation. - Pulmonary arteries: PA peak pressure: 24 mm Hg (S). - Pericardium, extracardiac: There was no pericardial effusion. Impressions: - Mild LVH with LVEF 55-60% and grade 1 diastolic dysfunction. Mildly sclerotic aortic valve with trivial aortic regurgitation. Trivial mitral regurgitation. Trivial tricuspid regurgitation with PASP 24 mmHg. No obvious PFO or ASD.  Bilateral Carotid Dopplers  12/26/2016 Right 60-79% ICA stenosis. Left 1-39% ICA stenosis.  Component     Latest Ref Rng & Units 12/26/2016  Cholesterol     0 - 200 mg/dL 161146  Triglycerides     <150 mg/dL 70  HDL Cholesterol     >40 mg/dL 30 (L)  Total CHOL/HDL Ratio     RATIO 4.9  VLDL     0 - 40 mg/dL 14  LDL (calc)     0 - 99 mg/dL 096102 (H)  Hemoglobin E4VA1C     4.8 - 5.6 % 7.1 (H)  Mean Plasma Glucose     mg/dL [phone]      Assessment: As you may recall, he is a 83 y.o. African American male with PMH of diabetes, hypertension, emphysema, CKD, and previous strokes with residual left spastic hemiparesis admitted on 12/25/16 for increased left-sided weakness. MRI showed acute patchy right MCA territory and right posterior watershed territory infarcts.  Also chronic lacunar infarcts in bilateral basal ganglia and thalami and brainstem as well as a few small remote right cerebellar infarcts.  MRA head unremarkable, carotid Doppler right ICA 60-79% stenosis.  EF 55-60%.  LDL 102 and A1c 7.1.  He stroke likely embolic from high-grade stenosis of right proximal ICA.  He was put on dual antiplatelet and Lipitor.  Not good candidate for CEA procedure due to advanced age, poor baseline and cognitive impairment. During the interval time, the patient has been doing much better.  Left-sided plegia much improved.  Plan:  - continue ASA and plavix for another one month and then plavix alone - continue lipitor for stroke prevention - check BP and glucose daily at facility - continue aggressive PT/OT/speech therapy at facility, and work hard with therapist - encourage more walker and cane time to improve.  - Follow up with your primary  care physician for stroke risk factor modification. Recommend maintain blood pressure goal 120-150/80, diabetes with hemoglobin A1c goal below 7.0% and lipids with LDL cholesterol goal below 70 mg/dL.  - follow up in 4 months.   I spent more than 25 minutes of face to face time with the patient. Greater than 50% of time was spent in counseling and coordination of care. We discussed antiplatelet use, BP and glucose monitoring, aggressive therapy of PT/OT/speech.   No orders of the defined types were placed in this encounter.   No orders of the defined types were placed in this encounter.   Patient Instructions  - continue ASA and plavix for another one month and then plavix alone - continue  lipitor for stroke prevention - check BP and glucose daily at facility - continue aggressive PT/OT/speech therapy at facility, and work hard with therapist - encourage more walker and cane time to improve.  - Follow up with your primary care physician for stroke risk factor modification. Recommend maintain blood pressure goal 120-150/80, diabetes with hemoglobin A1c goal below 7.0% and lipids with LDL cholesterol goal below 70 mg/dL.  - follow up in 4 months.     Marvel Plan, MD PhD [facility] [address], [address] [place], [place] [address] [phone]

## 2017-02-17 NOTE — Patient Instructions (Addendum)
-   continue ASA and plavix for another one month and then plavix alone - continue lipitor for stroke prevention - check BP and glucose daily at facility - continue aggressive PT/OT/speech therapy at facility, and work hard with therapist - encourage more walker and cane time to improve.  - Follow up with your primary care physician for stroke risk factor modification. Recommend maintain blood pressure goal 120-150/80, diabetes with hemoglobin A1c goal below 7.0% and lipids with LDL cholesterol goal below 70 mg/dL.  - follow up in 4 months.

## 2017-02-20 DIAGNOSIS — N183 Chronic kidney disease, stage 3 (moderate): Secondary | ICD-10-CM | POA: Diagnosis not present

## 2017-02-20 DIAGNOSIS — R2689 Other abnormalities of gait and mobility: Secondary | ICD-10-CM | POA: Diagnosis not present

## 2017-03-03 DIAGNOSIS — E785 Hyperlipidemia, unspecified: Secondary | ICD-10-CM | POA: Diagnosis not present

## 2017-03-03 DIAGNOSIS — I10 Essential (primary) hypertension: Secondary | ICD-10-CM | POA: Diagnosis not present

## 2017-03-03 DIAGNOSIS — N184 Chronic kidney disease, stage 4 (severe): Secondary | ICD-10-CM | POA: Diagnosis not present

## 2017-03-03 DIAGNOSIS — E1122 Type 2 diabetes mellitus with diabetic chronic kidney disease: Secondary | ICD-10-CM | POA: Diagnosis not present

## 2017-03-08 DIAGNOSIS — I69354 Hemiplegia and hemiparesis following cerebral infarction affecting left non-dominant side: Secondary | ICD-10-CM | POA: Diagnosis not present

## 2017-03-08 DIAGNOSIS — R2681 Unsteadiness on feet: Secondary | ICD-10-CM | POA: Diagnosis not present

## 2017-03-08 DIAGNOSIS — R471 Dysarthria and anarthria: Secondary | ICD-10-CM | POA: Diagnosis not present

## 2017-03-08 DIAGNOSIS — I63231 Cerebral infarction due to unspecified occlusion or stenosis of right carotid arteries: Secondary | ICD-10-CM | POA: Diagnosis not present

## 2017-03-08 DIAGNOSIS — R1312 Dysphagia, oropharyngeal phase: Secondary | ICD-10-CM | POA: Diagnosis not present

## 2017-03-09 DIAGNOSIS — R1312 Dysphagia, oropharyngeal phase: Secondary | ICD-10-CM | POA: Diagnosis not present

## 2017-03-09 DIAGNOSIS — R2681 Unsteadiness on feet: Secondary | ICD-10-CM | POA: Diagnosis not present

## 2017-03-09 DIAGNOSIS — R471 Dysarthria and anarthria: Secondary | ICD-10-CM | POA: Diagnosis not present

## 2017-03-09 DIAGNOSIS — I63231 Cerebral infarction due to unspecified occlusion or stenosis of right carotid arteries: Secondary | ICD-10-CM | POA: Diagnosis not present

## 2017-03-09 DIAGNOSIS — I69354 Hemiplegia and hemiparesis following cerebral infarction affecting left non-dominant side: Secondary | ICD-10-CM | POA: Diagnosis not present

## 2017-03-10 DIAGNOSIS — I69354 Hemiplegia and hemiparesis following cerebral infarction affecting left non-dominant side: Secondary | ICD-10-CM | POA: Diagnosis not present

## 2017-03-10 DIAGNOSIS — R2681 Unsteadiness on feet: Secondary | ICD-10-CM | POA: Diagnosis not present

## 2017-03-10 DIAGNOSIS — R471 Dysarthria and anarthria: Secondary | ICD-10-CM | POA: Diagnosis not present

## 2017-03-10 DIAGNOSIS — R1312 Dysphagia, oropharyngeal phase: Secondary | ICD-10-CM | POA: Diagnosis not present

## 2017-03-10 DIAGNOSIS — I63231 Cerebral infarction due to unspecified occlusion or stenosis of right carotid arteries: Secondary | ICD-10-CM | POA: Diagnosis not present

## 2017-03-11 DIAGNOSIS — R1312 Dysphagia, oropharyngeal phase: Secondary | ICD-10-CM | POA: Diagnosis not present

## 2017-03-11 DIAGNOSIS — R2681 Unsteadiness on feet: Secondary | ICD-10-CM | POA: Diagnosis not present

## 2017-03-11 DIAGNOSIS — R471 Dysarthria and anarthria: Secondary | ICD-10-CM | POA: Diagnosis not present

## 2017-03-11 DIAGNOSIS — I69354 Hemiplegia and hemiparesis following cerebral infarction affecting left non-dominant side: Secondary | ICD-10-CM | POA: Diagnosis not present

## 2017-03-11 DIAGNOSIS — I63231 Cerebral infarction due to unspecified occlusion or stenosis of right carotid arteries: Secondary | ICD-10-CM | POA: Diagnosis not present

## 2017-03-12 DIAGNOSIS — I69354 Hemiplegia and hemiparesis following cerebral infarction affecting left non-dominant side: Secondary | ICD-10-CM | POA: Diagnosis not present

## 2017-03-12 DIAGNOSIS — I63231 Cerebral infarction due to unspecified occlusion or stenosis of right carotid arteries: Secondary | ICD-10-CM | POA: Diagnosis not present

## 2017-03-12 DIAGNOSIS — R471 Dysarthria and anarthria: Secondary | ICD-10-CM | POA: Diagnosis not present

## 2017-03-12 DIAGNOSIS — R1312 Dysphagia, oropharyngeal phase: Secondary | ICD-10-CM | POA: Diagnosis not present

## 2017-03-12 DIAGNOSIS — R2681 Unsteadiness on feet: Secondary | ICD-10-CM | POA: Diagnosis not present

## 2017-03-15 DIAGNOSIS — I69354 Hemiplegia and hemiparesis following cerebral infarction affecting left non-dominant side: Secondary | ICD-10-CM | POA: Diagnosis not present

## 2017-03-15 DIAGNOSIS — R2681 Unsteadiness on feet: Secondary | ICD-10-CM | POA: Diagnosis not present

## 2017-03-15 DIAGNOSIS — R1312 Dysphagia, oropharyngeal phase: Secondary | ICD-10-CM | POA: Diagnosis not present

## 2017-03-15 DIAGNOSIS — R471 Dysarthria and anarthria: Secondary | ICD-10-CM | POA: Diagnosis not present

## 2017-03-15 DIAGNOSIS — I63231 Cerebral infarction due to unspecified occlusion or stenosis of right carotid arteries: Secondary | ICD-10-CM | POA: Diagnosis not present

## 2017-03-16 DIAGNOSIS — I63231 Cerebral infarction due to unspecified occlusion or stenosis of right carotid arteries: Secondary | ICD-10-CM | POA: Diagnosis not present

## 2017-03-16 DIAGNOSIS — R471 Dysarthria and anarthria: Secondary | ICD-10-CM | POA: Diagnosis not present

## 2017-03-16 DIAGNOSIS — R1312 Dysphagia, oropharyngeal phase: Secondary | ICD-10-CM | POA: Diagnosis not present

## 2017-03-16 DIAGNOSIS — R2681 Unsteadiness on feet: Secondary | ICD-10-CM | POA: Diagnosis not present

## 2017-03-16 DIAGNOSIS — I69354 Hemiplegia and hemiparesis following cerebral infarction affecting left non-dominant side: Secondary | ICD-10-CM | POA: Diagnosis not present

## 2017-03-17 DIAGNOSIS — R2681 Unsteadiness on feet: Secondary | ICD-10-CM | POA: Diagnosis not present

## 2017-03-17 DIAGNOSIS — R471 Dysarthria and anarthria: Secondary | ICD-10-CM | POA: Diagnosis not present

## 2017-03-17 DIAGNOSIS — R1312 Dysphagia, oropharyngeal phase: Secondary | ICD-10-CM | POA: Diagnosis not present

## 2017-03-17 DIAGNOSIS — I69354 Hemiplegia and hemiparesis following cerebral infarction affecting left non-dominant side: Secondary | ICD-10-CM | POA: Diagnosis not present

## 2017-03-17 DIAGNOSIS — I63231 Cerebral infarction due to unspecified occlusion or stenosis of right carotid arteries: Secondary | ICD-10-CM | POA: Diagnosis not present

## 2017-03-18 DIAGNOSIS — R2681 Unsteadiness on feet: Secondary | ICD-10-CM | POA: Diagnosis not present

## 2017-03-18 DIAGNOSIS — I69354 Hemiplegia and hemiparesis following cerebral infarction affecting left non-dominant side: Secondary | ICD-10-CM | POA: Diagnosis not present

## 2017-03-18 DIAGNOSIS — R1312 Dysphagia, oropharyngeal phase: Secondary | ICD-10-CM | POA: Diagnosis not present

## 2017-03-18 DIAGNOSIS — R471 Dysarthria and anarthria: Secondary | ICD-10-CM | POA: Diagnosis not present

## 2017-03-18 DIAGNOSIS — I63231 Cerebral infarction due to unspecified occlusion or stenosis of right carotid arteries: Secondary | ICD-10-CM | POA: Diagnosis not present

## 2017-03-19 DIAGNOSIS — R471 Dysarthria and anarthria: Secondary | ICD-10-CM | POA: Diagnosis not present

## 2017-03-19 DIAGNOSIS — I69354 Hemiplegia and hemiparesis following cerebral infarction affecting left non-dominant side: Secondary | ICD-10-CM | POA: Diagnosis not present

## 2017-03-19 DIAGNOSIS — R1312 Dysphagia, oropharyngeal phase: Secondary | ICD-10-CM | POA: Diagnosis not present

## 2017-03-19 DIAGNOSIS — R2681 Unsteadiness on feet: Secondary | ICD-10-CM | POA: Diagnosis not present

## 2017-03-19 DIAGNOSIS — I63231 Cerebral infarction due to unspecified occlusion or stenosis of right carotid arteries: Secondary | ICD-10-CM | POA: Diagnosis not present

## 2017-03-22 DIAGNOSIS — I69354 Hemiplegia and hemiparesis following cerebral infarction affecting left non-dominant side: Secondary | ICD-10-CM | POA: Diagnosis not present

## 2017-03-22 DIAGNOSIS — R2689 Other abnormalities of gait and mobility: Secondary | ICD-10-CM | POA: Diagnosis not present

## 2017-03-22 DIAGNOSIS — R471 Dysarthria and anarthria: Secondary | ICD-10-CM | POA: Diagnosis not present

## 2017-03-22 DIAGNOSIS — R1312 Dysphagia, oropharyngeal phase: Secondary | ICD-10-CM | POA: Diagnosis not present

## 2017-03-22 DIAGNOSIS — R2681 Unsteadiness on feet: Secondary | ICD-10-CM | POA: Diagnosis not present

## 2017-03-22 DIAGNOSIS — I63231 Cerebral infarction due to unspecified occlusion or stenosis of right carotid arteries: Secondary | ICD-10-CM | POA: Diagnosis not present

## 2017-03-22 DIAGNOSIS — N183 Chronic kidney disease, stage 3 (moderate): Secondary | ICD-10-CM | POA: Diagnosis not present

## 2017-03-23 DIAGNOSIS — R2681 Unsteadiness on feet: Secondary | ICD-10-CM | POA: Diagnosis not present

## 2017-03-23 DIAGNOSIS — I63231 Cerebral infarction due to unspecified occlusion or stenosis of right carotid arteries: Secondary | ICD-10-CM | POA: Diagnosis not present

## 2017-03-23 DIAGNOSIS — R471 Dysarthria and anarthria: Secondary | ICD-10-CM | POA: Diagnosis not present

## 2017-03-23 DIAGNOSIS — I69354 Hemiplegia and hemiparesis following cerebral infarction affecting left non-dominant side: Secondary | ICD-10-CM | POA: Diagnosis not present

## 2017-03-23 DIAGNOSIS — R1312 Dysphagia, oropharyngeal phase: Secondary | ICD-10-CM | POA: Diagnosis not present

## 2017-03-24 DIAGNOSIS — R471 Dysarthria and anarthria: Secondary | ICD-10-CM | POA: Diagnosis not present

## 2017-03-24 DIAGNOSIS — I63231 Cerebral infarction due to unspecified occlusion or stenosis of right carotid arteries: Secondary | ICD-10-CM | POA: Diagnosis not present

## 2017-03-24 DIAGNOSIS — R2681 Unsteadiness on feet: Secondary | ICD-10-CM | POA: Diagnosis not present

## 2017-03-24 DIAGNOSIS — I69354 Hemiplegia and hemiparesis following cerebral infarction affecting left non-dominant side: Secondary | ICD-10-CM | POA: Diagnosis not present

## 2017-03-24 DIAGNOSIS — R1312 Dysphagia, oropharyngeal phase: Secondary | ICD-10-CM | POA: Diagnosis not present

## 2017-03-25 DIAGNOSIS — I63231 Cerebral infarction due to unspecified occlusion or stenosis of right carotid arteries: Secondary | ICD-10-CM | POA: Diagnosis not present

## 2017-03-25 DIAGNOSIS — I69354 Hemiplegia and hemiparesis following cerebral infarction affecting left non-dominant side: Secondary | ICD-10-CM | POA: Diagnosis not present

## 2017-03-25 DIAGNOSIS — R2681 Unsteadiness on feet: Secondary | ICD-10-CM | POA: Diagnosis not present

## 2017-03-25 DIAGNOSIS — R1312 Dysphagia, oropharyngeal phase: Secondary | ICD-10-CM | POA: Diagnosis not present

## 2017-03-25 DIAGNOSIS — R471 Dysarthria and anarthria: Secondary | ICD-10-CM | POA: Diagnosis not present

## 2017-03-26 DIAGNOSIS — R1312 Dysphagia, oropharyngeal phase: Secondary | ICD-10-CM | POA: Diagnosis not present

## 2017-03-26 DIAGNOSIS — I63231 Cerebral infarction due to unspecified occlusion or stenosis of right carotid arteries: Secondary | ICD-10-CM | POA: Diagnosis not present

## 2017-03-26 DIAGNOSIS — R471 Dysarthria and anarthria: Secondary | ICD-10-CM | POA: Diagnosis not present

## 2017-03-26 DIAGNOSIS — I69354 Hemiplegia and hemiparesis following cerebral infarction affecting left non-dominant side: Secondary | ICD-10-CM | POA: Diagnosis not present

## 2017-03-26 DIAGNOSIS — R2681 Unsteadiness on feet: Secondary | ICD-10-CM | POA: Diagnosis not present

## 2017-03-29 DIAGNOSIS — I69354 Hemiplegia and hemiparesis following cerebral infarction affecting left non-dominant side: Secondary | ICD-10-CM | POA: Diagnosis not present

## 2017-03-29 DIAGNOSIS — R2681 Unsteadiness on feet: Secondary | ICD-10-CM | POA: Diagnosis not present

## 2017-03-29 DIAGNOSIS — I63231 Cerebral infarction due to unspecified occlusion or stenosis of right carotid arteries: Secondary | ICD-10-CM | POA: Diagnosis not present

## 2017-03-29 DIAGNOSIS — R471 Dysarthria and anarthria: Secondary | ICD-10-CM | POA: Diagnosis not present

## 2017-03-29 DIAGNOSIS — R1312 Dysphagia, oropharyngeal phase: Secondary | ICD-10-CM | POA: Diagnosis not present

## 2017-03-30 DIAGNOSIS — I63231 Cerebral infarction due to unspecified occlusion or stenosis of right carotid arteries: Secondary | ICD-10-CM | POA: Diagnosis not present

## 2017-03-30 DIAGNOSIS — R1312 Dysphagia, oropharyngeal phase: Secondary | ICD-10-CM | POA: Diagnosis not present

## 2017-03-30 DIAGNOSIS — R471 Dysarthria and anarthria: Secondary | ICD-10-CM | POA: Diagnosis not present

## 2017-03-30 DIAGNOSIS — I69354 Hemiplegia and hemiparesis following cerebral infarction affecting left non-dominant side: Secondary | ICD-10-CM | POA: Diagnosis not present

## 2017-03-30 DIAGNOSIS — R2681 Unsteadiness on feet: Secondary | ICD-10-CM | POA: Diagnosis not present

## 2017-03-31 DIAGNOSIS — I69354 Hemiplegia and hemiparesis following cerebral infarction affecting left non-dominant side: Secondary | ICD-10-CM | POA: Diagnosis not present

## 2017-03-31 DIAGNOSIS — R1312 Dysphagia, oropharyngeal phase: Secondary | ICD-10-CM | POA: Diagnosis not present

## 2017-03-31 DIAGNOSIS — R2681 Unsteadiness on feet: Secondary | ICD-10-CM | POA: Diagnosis not present

## 2017-03-31 DIAGNOSIS — R471 Dysarthria and anarthria: Secondary | ICD-10-CM | POA: Diagnosis not present

## 2017-03-31 DIAGNOSIS — I63231 Cerebral infarction due to unspecified occlusion or stenosis of right carotid arteries: Secondary | ICD-10-CM | POA: Diagnosis not present

## 2017-04-01 DIAGNOSIS — R471 Dysarthria and anarthria: Secondary | ICD-10-CM | POA: Diagnosis not present

## 2017-04-01 DIAGNOSIS — R2681 Unsteadiness on feet: Secondary | ICD-10-CM | POA: Diagnosis not present

## 2017-04-01 DIAGNOSIS — I63231 Cerebral infarction due to unspecified occlusion or stenosis of right carotid arteries: Secondary | ICD-10-CM | POA: Diagnosis not present

## 2017-04-01 DIAGNOSIS — I69354 Hemiplegia and hemiparesis following cerebral infarction affecting left non-dominant side: Secondary | ICD-10-CM | POA: Diagnosis not present

## 2017-04-01 DIAGNOSIS — R1312 Dysphagia, oropharyngeal phase: Secondary | ICD-10-CM | POA: Diagnosis not present

## 2017-04-02 DIAGNOSIS — I69354 Hemiplegia and hemiparesis following cerebral infarction affecting left non-dominant side: Secondary | ICD-10-CM | POA: Diagnosis not present

## 2017-04-02 DIAGNOSIS — R2681 Unsteadiness on feet: Secondary | ICD-10-CM | POA: Diagnosis not present

## 2017-04-02 DIAGNOSIS — R471 Dysarthria and anarthria: Secondary | ICD-10-CM | POA: Diagnosis not present

## 2017-04-02 DIAGNOSIS — R1312 Dysphagia, oropharyngeal phase: Secondary | ICD-10-CM | POA: Diagnosis not present

## 2017-04-02 DIAGNOSIS — I63231 Cerebral infarction due to unspecified occlusion or stenosis of right carotid arteries: Secondary | ICD-10-CM | POA: Diagnosis not present

## 2017-04-05 DIAGNOSIS — R471 Dysarthria and anarthria: Secondary | ICD-10-CM | POA: Diagnosis not present

## 2017-04-05 DIAGNOSIS — I63231 Cerebral infarction due to unspecified occlusion or stenosis of right carotid arteries: Secondary | ICD-10-CM | POA: Diagnosis not present

## 2017-04-05 DIAGNOSIS — I69354 Hemiplegia and hemiparesis following cerebral infarction affecting left non-dominant side: Secondary | ICD-10-CM | POA: Diagnosis not present

## 2017-04-05 DIAGNOSIS — R1312 Dysphagia, oropharyngeal phase: Secondary | ICD-10-CM | POA: Diagnosis not present

## 2017-04-05 DIAGNOSIS — R2681 Unsteadiness on feet: Secondary | ICD-10-CM | POA: Diagnosis not present

## 2017-04-06 DIAGNOSIS — R2681 Unsteadiness on feet: Secondary | ICD-10-CM | POA: Diagnosis not present

## 2017-04-06 DIAGNOSIS — I63231 Cerebral infarction due to unspecified occlusion or stenosis of right carotid arteries: Secondary | ICD-10-CM | POA: Diagnosis not present

## 2017-04-06 DIAGNOSIS — R471 Dysarthria and anarthria: Secondary | ICD-10-CM | POA: Diagnosis not present

## 2017-04-06 DIAGNOSIS — R1312 Dysphagia, oropharyngeal phase: Secondary | ICD-10-CM | POA: Diagnosis not present

## 2017-04-06 DIAGNOSIS — I69354 Hemiplegia and hemiparesis following cerebral infarction affecting left non-dominant side: Secondary | ICD-10-CM | POA: Diagnosis not present

## 2017-04-07 DIAGNOSIS — R2681 Unsteadiness on feet: Secondary | ICD-10-CM | POA: Diagnosis not present

## 2017-04-07 DIAGNOSIS — I69354 Hemiplegia and hemiparesis following cerebral infarction affecting left non-dominant side: Secondary | ICD-10-CM | POA: Diagnosis not present

## 2017-04-07 DIAGNOSIS — R1312 Dysphagia, oropharyngeal phase: Secondary | ICD-10-CM | POA: Diagnosis not present

## 2017-04-07 DIAGNOSIS — I63231 Cerebral infarction due to unspecified occlusion or stenosis of right carotid arteries: Secondary | ICD-10-CM | POA: Diagnosis not present

## 2017-04-07 DIAGNOSIS — R471 Dysarthria and anarthria: Secondary | ICD-10-CM | POA: Diagnosis not present

## 2017-04-08 DIAGNOSIS — R1312 Dysphagia, oropharyngeal phase: Secondary | ICD-10-CM | POA: Diagnosis not present

## 2017-04-08 DIAGNOSIS — B351 Tinea unguium: Secondary | ICD-10-CM | POA: Diagnosis not present

## 2017-04-08 DIAGNOSIS — I69354 Hemiplegia and hemiparesis following cerebral infarction affecting left non-dominant side: Secondary | ICD-10-CM | POA: Diagnosis not present

## 2017-04-08 DIAGNOSIS — R2681 Unsteadiness on feet: Secondary | ICD-10-CM | POA: Diagnosis not present

## 2017-04-08 DIAGNOSIS — I679 Cerebrovascular disease, unspecified: Secondary | ICD-10-CM | POA: Diagnosis not present

## 2017-04-08 DIAGNOSIS — R471 Dysarthria and anarthria: Secondary | ICD-10-CM | POA: Diagnosis not present

## 2017-04-08 DIAGNOSIS — I63231 Cerebral infarction due to unspecified occlusion or stenosis of right carotid arteries: Secondary | ICD-10-CM | POA: Diagnosis not present

## 2017-04-08 DIAGNOSIS — E785 Hyperlipidemia, unspecified: Secondary | ICD-10-CM | POA: Diagnosis not present

## 2017-04-08 DIAGNOSIS — M79675 Pain in left toe(s): Secondary | ICD-10-CM | POA: Diagnosis not present

## 2017-04-08 DIAGNOSIS — I10 Essential (primary) hypertension: Secondary | ICD-10-CM | POA: Diagnosis not present

## 2017-04-08 DIAGNOSIS — M79674 Pain in right toe(s): Secondary | ICD-10-CM | POA: Diagnosis not present

## 2017-04-09 DIAGNOSIS — I69354 Hemiplegia and hemiparesis following cerebral infarction affecting left non-dominant side: Secondary | ICD-10-CM | POA: Diagnosis not present

## 2017-04-09 DIAGNOSIS — R2681 Unsteadiness on feet: Secondary | ICD-10-CM | POA: Diagnosis not present

## 2017-04-09 DIAGNOSIS — R1312 Dysphagia, oropharyngeal phase: Secondary | ICD-10-CM | POA: Diagnosis not present

## 2017-04-09 DIAGNOSIS — I63231 Cerebral infarction due to unspecified occlusion or stenosis of right carotid arteries: Secondary | ICD-10-CM | POA: Diagnosis not present

## 2017-04-09 DIAGNOSIS — R471 Dysarthria and anarthria: Secondary | ICD-10-CM | POA: Diagnosis not present

## 2017-04-12 DIAGNOSIS — R1312 Dysphagia, oropharyngeal phase: Secondary | ICD-10-CM | POA: Diagnosis not present

## 2017-04-12 DIAGNOSIS — R2681 Unsteadiness on feet: Secondary | ICD-10-CM | POA: Diagnosis not present

## 2017-04-12 DIAGNOSIS — I69354 Hemiplegia and hemiparesis following cerebral infarction affecting left non-dominant side: Secondary | ICD-10-CM | POA: Diagnosis not present

## 2017-04-12 DIAGNOSIS — R471 Dysarthria and anarthria: Secondary | ICD-10-CM | POA: Diagnosis not present

## 2017-04-12 DIAGNOSIS — I63231 Cerebral infarction due to unspecified occlusion or stenosis of right carotid arteries: Secondary | ICD-10-CM | POA: Diagnosis not present

## 2017-04-13 DIAGNOSIS — I69354 Hemiplegia and hemiparesis following cerebral infarction affecting left non-dominant side: Secondary | ICD-10-CM | POA: Diagnosis not present

## 2017-04-13 DIAGNOSIS — R1312 Dysphagia, oropharyngeal phase: Secondary | ICD-10-CM | POA: Diagnosis not present

## 2017-04-13 DIAGNOSIS — I63231 Cerebral infarction due to unspecified occlusion or stenosis of right carotid arteries: Secondary | ICD-10-CM | POA: Diagnosis not present

## 2017-04-13 DIAGNOSIS — R471 Dysarthria and anarthria: Secondary | ICD-10-CM | POA: Diagnosis not present

## 2017-04-13 DIAGNOSIS — R2681 Unsteadiness on feet: Secondary | ICD-10-CM | POA: Diagnosis not present

## 2017-04-14 DIAGNOSIS — I63231 Cerebral infarction due to unspecified occlusion or stenosis of right carotid arteries: Secondary | ICD-10-CM | POA: Diagnosis not present

## 2017-04-14 DIAGNOSIS — R2681 Unsteadiness on feet: Secondary | ICD-10-CM | POA: Diagnosis not present

## 2017-04-14 DIAGNOSIS — R471 Dysarthria and anarthria: Secondary | ICD-10-CM | POA: Diagnosis not present

## 2017-04-14 DIAGNOSIS — I69354 Hemiplegia and hemiparesis following cerebral infarction affecting left non-dominant side: Secondary | ICD-10-CM | POA: Diagnosis not present

## 2017-04-14 DIAGNOSIS — R1312 Dysphagia, oropharyngeal phase: Secondary | ICD-10-CM | POA: Diagnosis not present

## 2017-04-15 DIAGNOSIS — I63231 Cerebral infarction due to unspecified occlusion or stenosis of right carotid arteries: Secondary | ICD-10-CM | POA: Diagnosis not present

## 2017-04-15 DIAGNOSIS — R471 Dysarthria and anarthria: Secondary | ICD-10-CM | POA: Diagnosis not present

## 2017-04-15 DIAGNOSIS — R1312 Dysphagia, oropharyngeal phase: Secondary | ICD-10-CM | POA: Diagnosis not present

## 2017-04-15 DIAGNOSIS — R2681 Unsteadiness on feet: Secondary | ICD-10-CM | POA: Diagnosis not present

## 2017-04-15 DIAGNOSIS — I69354 Hemiplegia and hemiparesis following cerebral infarction affecting left non-dominant side: Secondary | ICD-10-CM | POA: Diagnosis not present

## 2017-04-16 DIAGNOSIS — R2681 Unsteadiness on feet: Secondary | ICD-10-CM | POA: Diagnosis not present

## 2017-04-16 DIAGNOSIS — R471 Dysarthria and anarthria: Secondary | ICD-10-CM | POA: Diagnosis not present

## 2017-04-16 DIAGNOSIS — R1312 Dysphagia, oropharyngeal phase: Secondary | ICD-10-CM | POA: Diagnosis not present

## 2017-04-16 DIAGNOSIS — I69354 Hemiplegia and hemiparesis following cerebral infarction affecting left non-dominant side: Secondary | ICD-10-CM | POA: Diagnosis not present

## 2017-04-16 DIAGNOSIS — I63231 Cerebral infarction due to unspecified occlusion or stenosis of right carotid arteries: Secondary | ICD-10-CM | POA: Diagnosis not present

## 2017-04-19 DIAGNOSIS — R1312 Dysphagia, oropharyngeal phase: Secondary | ICD-10-CM | POA: Diagnosis not present

## 2017-04-19 DIAGNOSIS — R2681 Unsteadiness on feet: Secondary | ICD-10-CM | POA: Diagnosis not present

## 2017-04-19 DIAGNOSIS — I69354 Hemiplegia and hemiparesis following cerebral infarction affecting left non-dominant side: Secondary | ICD-10-CM | POA: Diagnosis not present

## 2017-04-19 DIAGNOSIS — I63231 Cerebral infarction due to unspecified occlusion or stenosis of right carotid arteries: Secondary | ICD-10-CM | POA: Diagnosis not present

## 2017-04-19 DIAGNOSIS — R471 Dysarthria and anarthria: Secondary | ICD-10-CM | POA: Diagnosis not present

## 2017-04-20 DIAGNOSIS — R1312 Dysphagia, oropharyngeal phase: Secondary | ICD-10-CM | POA: Diagnosis not present

## 2017-04-20 DIAGNOSIS — R2681 Unsteadiness on feet: Secondary | ICD-10-CM | POA: Diagnosis not present

## 2017-04-20 DIAGNOSIS — R471 Dysarthria and anarthria: Secondary | ICD-10-CM | POA: Diagnosis not present

## 2017-04-20 DIAGNOSIS — I69354 Hemiplegia and hemiparesis following cerebral infarction affecting left non-dominant side: Secondary | ICD-10-CM | POA: Diagnosis not present

## 2017-04-20 DIAGNOSIS — I63231 Cerebral infarction due to unspecified occlusion or stenosis of right carotid arteries: Secondary | ICD-10-CM | POA: Diagnosis not present

## 2017-04-21 DIAGNOSIS — I69354 Hemiplegia and hemiparesis following cerebral infarction affecting left non-dominant side: Secondary | ICD-10-CM | POA: Diagnosis not present

## 2017-04-21 DIAGNOSIS — R471 Dysarthria and anarthria: Secondary | ICD-10-CM | POA: Diagnosis not present

## 2017-04-21 DIAGNOSIS — I63231 Cerebral infarction due to unspecified occlusion or stenosis of right carotid arteries: Secondary | ICD-10-CM | POA: Diagnosis not present

## 2017-04-21 DIAGNOSIS — R1312 Dysphagia, oropharyngeal phase: Secondary | ICD-10-CM | POA: Diagnosis not present

## 2017-04-21 DIAGNOSIS — R2681 Unsteadiness on feet: Secondary | ICD-10-CM | POA: Diagnosis not present

## 2017-04-22 DIAGNOSIS — I63231 Cerebral infarction due to unspecified occlusion or stenosis of right carotid arteries: Secondary | ICD-10-CM | POA: Diagnosis not present

## 2017-04-22 DIAGNOSIS — R2681 Unsteadiness on feet: Secondary | ICD-10-CM | POA: Diagnosis not present

## 2017-04-22 DIAGNOSIS — N183 Chronic kidney disease, stage 3 (moderate): Secondary | ICD-10-CM | POA: Diagnosis not present

## 2017-04-22 DIAGNOSIS — R1312 Dysphagia, oropharyngeal phase: Secondary | ICD-10-CM | POA: Diagnosis not present

## 2017-04-22 DIAGNOSIS — I69354 Hemiplegia and hemiparesis following cerebral infarction affecting left non-dominant side: Secondary | ICD-10-CM | POA: Diagnosis not present

## 2017-04-22 DIAGNOSIS — R471 Dysarthria and anarthria: Secondary | ICD-10-CM | POA: Diagnosis not present

## 2017-04-22 DIAGNOSIS — R2689 Other abnormalities of gait and mobility: Secondary | ICD-10-CM | POA: Diagnosis not present

## 2017-04-23 DIAGNOSIS — R471 Dysarthria and anarthria: Secondary | ICD-10-CM | POA: Diagnosis not present

## 2017-04-23 DIAGNOSIS — I69354 Hemiplegia and hemiparesis following cerebral infarction affecting left non-dominant side: Secondary | ICD-10-CM | POA: Diagnosis not present

## 2017-04-23 DIAGNOSIS — R2681 Unsteadiness on feet: Secondary | ICD-10-CM | POA: Diagnosis not present

## 2017-04-23 DIAGNOSIS — R1312 Dysphagia, oropharyngeal phase: Secondary | ICD-10-CM | POA: Diagnosis not present

## 2017-04-23 DIAGNOSIS — I63231 Cerebral infarction due to unspecified occlusion or stenosis of right carotid arteries: Secondary | ICD-10-CM | POA: Diagnosis not present

## 2017-04-26 DIAGNOSIS — R471 Dysarthria and anarthria: Secondary | ICD-10-CM | POA: Diagnosis not present

## 2017-04-26 DIAGNOSIS — I63231 Cerebral infarction due to unspecified occlusion or stenosis of right carotid arteries: Secondary | ICD-10-CM | POA: Diagnosis not present

## 2017-04-26 DIAGNOSIS — R1312 Dysphagia, oropharyngeal phase: Secondary | ICD-10-CM | POA: Diagnosis not present

## 2017-04-26 DIAGNOSIS — R2681 Unsteadiness on feet: Secondary | ICD-10-CM | POA: Diagnosis not present

## 2017-04-26 DIAGNOSIS — I69354 Hemiplegia and hemiparesis following cerebral infarction affecting left non-dominant side: Secondary | ICD-10-CM | POA: Diagnosis not present

## 2017-05-27 DIAGNOSIS — E1122 Type 2 diabetes mellitus with diabetic chronic kidney disease: Secondary | ICD-10-CM | POA: Diagnosis not present

## 2017-05-27 DIAGNOSIS — I129 Hypertensive chronic kidney disease with stage 1 through stage 4 chronic kidney disease, or unspecified chronic kidney disease: Secondary | ICD-10-CM | POA: Diagnosis not present

## 2017-05-27 DIAGNOSIS — M1990 Unspecified osteoarthritis, unspecified site: Secondary | ICD-10-CM | POA: Diagnosis not present

## 2017-05-27 DIAGNOSIS — N184 Chronic kidney disease, stage 4 (severe): Secondary | ICD-10-CM | POA: Diagnosis not present

## 2017-06-01 DIAGNOSIS — E119 Type 2 diabetes mellitus without complications: Secondary | ICD-10-CM | POA: Diagnosis not present

## 2017-06-01 DIAGNOSIS — I129 Hypertensive chronic kidney disease with stage 1 through stage 4 chronic kidney disease, or unspecified chronic kidney disease: Secondary | ICD-10-CM | POA: Diagnosis not present

## 2017-06-01 DIAGNOSIS — Z79899 Other long term (current) drug therapy: Secondary | ICD-10-CM | POA: Diagnosis not present

## 2017-06-01 DIAGNOSIS — E039 Hypothyroidism, unspecified: Secondary | ICD-10-CM | POA: Diagnosis not present

## 2017-06-01 DIAGNOSIS — D649 Anemia, unspecified: Secondary | ICD-10-CM | POA: Diagnosis not present

## 2017-06-01 DIAGNOSIS — E785 Hyperlipidemia, unspecified: Secondary | ICD-10-CM | POA: Diagnosis not present

## 2017-06-16 NOTE — Progress Notes (Signed)
[facility]  PATIENT: Jerry Lane DOB: 03-29-1928   REASON FOR VISIT: Follow-up for stroke Jerry LowensteinHISTORY FROM: Patient and sister [name]    HISTORY OF PRESENT ILLNESS: JerryKerman U Roachis a 84 y.o.malewith history of diabetes, hypertension,emphysema,CKD,and previous strokes with residual left spastic hemiparesis admitted on 12/25/16 for increased left-sided weakness.  MRI showed acute patchy right MCA territory and right posterior watershed territory infarcts.  Also chronic lacunar infarcts in bilateral basal ganglia and thalami and brainstem as well as a few small remote right cerebellar infarcts.  MRA head unremarkable, carotid Doppler right ICA 60-79% stenosis.  EF 55-60%.  LDL 102 and A1c 7.1.  He stroke likely embolic from high-grade stenosis of right proximal ICA.  He was put on dual antiplatelet and Lipitor.  Not good candidate for CEA procedure due to advanced age, poor baseline and cognitive impairment.  Also had UTI treated with Rocephin.  CKD stable.  Discharge to SNF with left hemiplegia.  Interval History 02/17/17 [name] During the interval time, the patient has been doing much better.  Left-sided plegia much improved, able to walk with walker in skilled nursing facility although came in today with wheelchair.  Not sure about BP and glucose control in the facility.  On DAPT and Lipitor without side effect.  BP today in clinic 134/69. UPDATE 3/14/2019CM [name], 84 year old male returns for follow-up with his sister.  He is currently residing in a rehab facility.  He has a history of right MCA territory stroke and right posterior watershed territory infarcts.  He remains on Plavix for secondary stroke prevention with minimal bruising and no bleeding.  His aspirin has stopped.  LPN at the facility however reports sometimes he does not take his medications as prescribed.  He continues to have some left-sided weakness  able to walk with a walker in the facility  although in a wheelchair today.  Blood pressure in the office 137/78.  Most recent hemoglobin A1c 9.3 06/01/2017.  His appetite is good and he is sleeping well.  He does have 2 other roommates he returns for reevaluation  REVIEW OF SYSTEMS: Full 14 system review of systems performed and notable only for those listed, all others are neg:  Constitutional: neg  Cardiovascular: neg Ear/Nose/Throat: neg  Skin: neg Eyes: neg Respiratory: neg Gastroitestinal: neg  Hematology/Lymphatic: neg  Endocrine: neg Musculoskeletal: Walking difficulty Allergy/Immunology: neg Neurological: History of stroke Psychiatric: neg Sleep : neg   ALLERGIES: No Known Allergies  HOME MEDICATIONS: Outpatient Medications Prior to Visit  Medication Sig Dispense Refill  . acetaminophen (TYLENOL) 500 MG tablet Take 500 mg by mouth every 6 (six) hours as needed for mild pain or moderate pain.    Marland Kitchen. amLODipine (NORVASC) 2.5 MG tablet Take 2.5 mg by mouth daily.      Marland Kitchen. atorvastatin (LIPITOR) 40 MG tablet Take 1 tablet (40 mg total) by mouth daily at 6 PM. 30 tablet 0  . bisacodyl (DULCOLAX) 5 MG EC tablet Take 5 mg by mouth daily.    . clopidogrel (PLAVIX) 75 MG tablet Take 1 tablet (75 mg total) by mouth daily. 30 tablet 0  . feeding supplement, ENSURE ENLIVE, (ENSURE ENLIVE) LIQD Take 237 mLs by mouth 2 (two) times daily between meals. 237 mL 12  . Maltodextrin-Xanthan Gum (RESOURCE THICKENUP CLEAR) POWD Use as needed    . Multiple Vitamin (MULTIVITAMIN WITH MINERALS) TABS tablet Take 1 tablet by mouth daily.    . TRADJENTA 5 MG TABS tablet Take 5 mg  by mouth daily.     Marland Kitchen aspirin 325 MG tablet Take 1 tablet (325 mg total) by mouth daily. (Patient not taking: Reported on 06/17/2017) 30 tablet 0   No facility-administered medications prior to visit.     PAST MEDICAL HISTORY: Past Medical History:  Diagnosis Date  . Arthritis   . Diabetes mellitus    Type II  . Hypertension   . Stroke [facility])    no residual  weakness    PAST SURGICAL HISTORY: Past Surgical History:  Procedure Laterality Date  . TIBIA FRACTURE SURGERY  1987   Right, [facility]?    FAMILY HISTORY: Family History  Problem Relation Age of Onset  . Anesthesia problems Neg Hx   . Hypotension Neg Hx   . Malignant hyperthermia Neg Hx   . Pseudochol deficiency Neg Hx     SOCIAL HISTORY: Social History   Socioeconomic History  . Marital status: Single    Spouse name: Not on file  . Number of children: Not on file  . Years of education: Not on file  . Highest education level: Not on file  Social Needs  . Financial resource strain: Not on file  . Food insecurity - worry: Not on file  . Food insecurity - inability: Not on file  . Transportation needs - medical: Not on file  . Transportation needs - non-medical: Not on file  Occupational History  . Not on file  Tobacco Use  . Smoking status: Former Smoker    Years: 60.00    Types: Cigarettes    Last attempt to quit: 10/13/2005    Years since quitting: 11.6  . Smokeless tobacco: Never Used  Substance and Sexual Activity  . Alcohol use: No  . Drug use: No  . Sexual activity: Not on file  Other Topics Concern  . Not on file  Social History Narrative  . Not on file     PHYSICAL EXAM  Vitals:   06/17/17 1525  BP: 137/78  Pulse: 86   There is no height or weight on file to calculate BMI.  Generalized: Well developed, frail male in no acute distress  Head: normocephalic and atraumatic,. Oropharynx benign  Neck: Supple, no carotid bruits  Cardiac: Regular rate rhythm, no murmur  Musculoskeletal: No deformity   Neurological examination   Mentation: Alert mild cognitive impairment   Follows most  commands speech remains dysarthric   Cranial nerve II-XII: Fundoscopic exam reveals sharp disc margins.Pupils were equal round reactive to light extraocular movements were full, visual field were full on confrontational test.  Left facial droop .  Hard of hearing  Uvula  tongue midline. head turning and shoulder shrug were normal and symmetric.Tongue protrusion into cheek strength was normal. Motor: normal bulk and tone, full strength in the BUE, BLE, on the right.  3 out of 5 left upper extremity with increased muscle tone and contracture at left elbow.  Left lower extremity 4 out of 5 proximal and distal  Sensory: normal and symmetric to light touch, pinprick, and  Vibration, in the upper and lower extremities Coordination: finger-nose-finger, heel-to-shin bilaterally, normal on the right, unable to perform on the left Reflexes: 1+ upper lower and symmetric, plantar responses were flexor bilaterally. Gait and Station: He is in wheelchair not ambulated  DIAGNOSTIC DATA (LABS, IMAGING, TESTING) - I reviewed patient records, labs, notes, testing and imaging myself where available.  Lab Results  Component Value Date   WBC 4.0 12/30/2016   HGB 11.0 (L) 12/30/2016  HCT 34.8 (L) 12/30/2016   MCV 86.4 12/30/2016   PLT 344 12/30/2016      Component Value Date/Time   NA 142 12/30/2016 0405   K 4.6 12/30/2016 0405   CL 110 12/30/2016 0405   CO2 20 (L) 12/30/2016 0405   GLUCOSE 106 (H) 12/30/2016 0405   BUN 27 (H) 12/30/2016 0405   CREATININE 2.30 (H) 12/30/2016 0405   CALCIUM 8.7 (L) 12/30/2016 0405   PROT 8.1 12/25/2016 1905   ALBUMIN 3.5 12/25/2016 1905   AST 20 12/25/2016 1905   ALT 18 12/25/2016 1905   ALKPHOS 66 12/25/2016 1905   BILITOT 0.4 12/25/2016 1905   GFRNONAA 24 (L) 12/30/2016 0405   GFRAA 28 (L) 12/30/2016 0405   Lab Results  Component Value Date   CHOL 146 12/26/2016   HDL 30 (L) 12/26/2016   LDLCALC 102 (H) 12/26/2016   TRIG 70 12/26/2016   CHOLHDL 4.9 12/26/2016   Lab Results  Component Value Date   HGBA1C 7.1 (H) 12/26/2016   Lab Results  Component Value Date   VITAMINB12 262 01/09/2016   No results found for: TSH    ASSESSMENT AND PLAN 84 y.o. African American male with PMH of diabetes,  hypertension,emphysema,CKD,and previous strokes with residual left spastic hemiparesis admitted on 12/25/16 for increased left-sided weakness. MRI showed acute patchy right MCA territory and right posterior watershed territory infarcts.  Also chronic lacunar infarcts in bilateral basal ganglia and thalami and brainstem as well as a few small remote right cerebellar infarcts.  MRA head unremarkable, carotid Doppler right ICA 60-79% stenosis.  EF 55-60%.  LDL 102 and A1c 7.1.  His stroke likely embolic from high-grade stenosis of right proximal ICA.  He was put on dual antiplatelet and Lipitor.  Not good candidate for CEA procedure due to advanced age, poor baseline and cognitive impairment. During the interval time, the patient has been doing much better.  Left-sided plegia.  Now on Plavix alone. The patient is a current patient of Dr. Roda Shutters  who is out of the office today . This note is sent to the work in doctor.     Plan:  Continue plavix for secondary stroke prevention Continue lipitor for stroke prevention Check BP and glucose daily at facility Encourage more walker and cane time to improve left strength.  Follow up with your primary care physician for stroke risk factor modification. Recommend maintain blood pressure goal 120-150/80, diabetes with hemoglobin A1c goal below 7.0% and Lipids with LDL cholesterol goal below 70 mg/dL.  Follow up in 6 months.  I spent in total face to face time with the patient/ sister  more than 50% of which was spent counseling and coordination of care, reviewing test results reviewing medications and discussing and reviewing the diagnosis of stroke and management of risk factors.  Patient counseled on being compliant with medications Nilda Riggs, [facility], [facility], APRN  [facility] [address], [address] [place], [place] [address] [phone]

## 2017-06-17 ENCOUNTER — Encounter: Payer: Self-pay | Admitting: Nurse Practitioner

## 2017-06-17 ENCOUNTER — Ambulatory Visit (INDEPENDENT_AMBULATORY_CARE_PROVIDER_SITE_OTHER): Payer: Medicare Other | Admitting: Nurse Practitioner

## 2017-06-17 VITALS — BP 137/78 | HR 86

## 2017-06-17 DIAGNOSIS — E785 Hyperlipidemia, unspecified: Secondary | ICD-10-CM

## 2017-06-17 DIAGNOSIS — R531 Weakness: Secondary | ICD-10-CM | POA: Diagnosis not present

## 2017-06-17 DIAGNOSIS — I63231 Cerebral infarction due to unspecified occlusion or stenosis of right carotid arteries: Secondary | ICD-10-CM

## 2017-06-17 DIAGNOSIS — I10 Essential (primary) hypertension: Secondary | ICD-10-CM | POA: Diagnosis not present

## 2017-06-17 NOTE — Progress Notes (Signed)
I have read the note, and I agree with the clinical assessment and plan.  Farah Lepak A. Jonika Critz, MD, PhD, FAAN Certified in Neurology, Clinical Neurophysiology, Sleep Medicine, Pain Medicine and Neuroimaging  [facility] [address], [address] [place], [place] [address] [phone]  

## 2017-06-17 NOTE — Patient Instructions (Signed)
Per skilled sheet 

## 2017-06-17 NOTE — Progress Notes (Signed)
Called facility where patient resides to check on ASA on med MAR. Per Archie Pattenonya LPN who cares for patient at least weekly, he is still on ASA. However she stated he usually refuses to take all meds every day.  This RN advised her per Dr Warren DanesXu's Nov note, patient was to stop ASA in mid Dec. Archie Pattenonya stated she would make a note, and she asked that be placed on order sheet that is with patient today. NP made aware.

## 2017-06-29 DIAGNOSIS — M79675 Pain in left toe(s): Secondary | ICD-10-CM | POA: Diagnosis not present

## 2017-06-29 DIAGNOSIS — M79674 Pain in right toe(s): Secondary | ICD-10-CM | POA: Diagnosis not present

## 2017-06-29 DIAGNOSIS — B351 Tinea unguium: Secondary | ICD-10-CM | POA: Diagnosis not present

## 2017-07-27 DIAGNOSIS — E785 Hyperlipidemia, unspecified: Secondary | ICD-10-CM | POA: Diagnosis not present

## 2017-07-27 DIAGNOSIS — I10 Essential (primary) hypertension: Secondary | ICD-10-CM | POA: Diagnosis not present

## 2017-07-27 DIAGNOSIS — M1990 Unspecified osteoarthritis, unspecified site: Secondary | ICD-10-CM | POA: Diagnosis not present

## 2017-07-27 DIAGNOSIS — I679 Cerebrovascular disease, unspecified: Secondary | ICD-10-CM | POA: Diagnosis not present

## 2017-07-28 DIAGNOSIS — M84461A Pathological fracture, right tibia, initial encounter for fracture: Secondary | ICD-10-CM | POA: Diagnosis not present

## 2017-07-28 DIAGNOSIS — R2991 Unspecified symptoms and signs involving the musculoskeletal system: Secondary | ICD-10-CM | POA: Diagnosis not present

## 2017-07-28 DIAGNOSIS — M84463A Pathological fracture, right fibula, initial encounter for fracture: Secondary | ICD-10-CM | POA: Diagnosis not present

## 2017-09-13 DIAGNOSIS — E1122 Type 2 diabetes mellitus with diabetic chronic kidney disease: Secondary | ICD-10-CM | POA: Diagnosis not present

## 2017-09-13 DIAGNOSIS — I10 Essential (primary) hypertension: Secondary | ICD-10-CM | POA: Diagnosis not present

## 2017-09-15 DIAGNOSIS — B351 Tinea unguium: Secondary | ICD-10-CM | POA: Diagnosis not present

## 2017-09-15 DIAGNOSIS — M79674 Pain in right toe(s): Secondary | ICD-10-CM | POA: Diagnosis not present

## 2017-09-15 DIAGNOSIS — M79675 Pain in left toe(s): Secondary | ICD-10-CM | POA: Diagnosis not present

## 2017-09-16 ENCOUNTER — Other Ambulatory Visit: Payer: Self-pay

## 2017-09-16 NOTE — Patient Outreach (Signed)
[facility] Care Management  09/16/2017  Marny Lowensteinrvin U Tartt 1927/08/24 [id number]   Medication Adherence call to Mr. Jennette Kettlervin Gural spoke with patient's wife she said patient is at a long time rehab home, patient is due on Tradjenta 5 mg .[name] is showing past due under [facility]on Tradjenta 5 mg.   Lillia AbedAna Ollison-Moran CPhT Pharmacy Technician [facility] Care Management Direct Dial [phone]  Fax [phone] [email or web address]

## 2017-12-22 NOTE — Progress Notes (Deleted)
[facility]  PATIENT: Jerry Lane DOB: 21-Jan-1928   REASON FOR VISIT: Follow-up for stroke HISTORY FROM: Patient and sister Pattricia Boss    HISTORY OF PRESENT ILLNESS: Mr.Jerry U Roachis a 84 y.o.malewith history of diabetes, hypertension,emphysema,CKD,and previous strokes with residual left spastic hemiparesis admitted on 12/25/16 for increased left-sided weakness.  MRI showed acute patchy right MCA territory and right posterior watershed territory infarcts.  Also chronic lacunar infarcts in bilateral basal ganglia and thalami and brainstem as well as a few small remote right cerebellar infarcts.  MRA head unremarkable, carotid Doppler right ICA 60-79% stenosis.  EF 55-60%.  LDL 102 and A1c 7.1.  He stroke likely embolic from high-grade stenosis of right proximal ICA.  He was put on dual antiplatelet and Lipitor.  Not good candidate for CEA procedure due to advanced age, poor baseline and cognitive impairment.  Also had UTI treated with Rocephin.  CKD stable.  Discharge to SNF with left hemiplegia.  Interval History 02/17/17 Dr. Roda Shutters During the interval time, the patient has been doing much better.  Left-sided plegia much improved, able to walk with walker in skilled nursing facility although came in today with wheelchair.  Not sure about BP and glucose control in the facility.  On DAPT and Lipitor without side effect.  BP today in clinic 134/69. UPDATE 3/14/2019CM [name], 84 year old male returns for follow-up with his sister.  He is currently residing in a rehab facility.  He has a history of right MCA territory stroke and right posterior watershed territory infarcts.  He remains on Plavix for secondary stroke prevention with minimal bruising and no bleeding.  His aspirin has stopped.  LPN at the facility however reports sometimes he does not take his medications as prescribed.  He continues to have some left-sided weakness  able to walk with a walker in the facility  although in a wheelchair today.  Blood pressure in the office 137/78.  Most recent hemoglobin A1c 9.3 06/01/2017.  His appetite is good and he is sleeping well.  He does have 2 other roommates he returns for reevaluation  REVIEW OF SYSTEMS: Full 14 system review of systems performed and notable only for those listed, all others are neg:  Constitutional: neg  Cardiovascular: neg Ear/Nose/Throat: neg  Skin: neg Eyes: neg Respiratory: neg Gastroitestinal: neg  Hematology/Lymphatic: neg  Endocrine: neg Musculoskeletal: Walking difficulty Allergy/Immunology: neg Neurological: History of stroke Psychiatric: neg Sleep : neg   ALLERGIES: No Known Allergies  HOME MEDICATIONS: Outpatient Medications Prior to Visit  Medication Sig Dispense Refill  . acetaminophen (TYLENOL) 500 MG tablet Take 500 mg by mouth every 6 (six) hours as needed for mild pain or moderate pain.    Marland Kitchen amLODipine (NORVASC) 2.5 MG tablet Take 2.5 mg by mouth daily.      Marland Kitchen aspirin 325 MG tablet Take 1 tablet (325 mg total) by mouth daily. (Patient not taking: Reported on 06/17/2017) 30 tablet 0  . atorvastatin (LIPITOR) 40 MG tablet Take 1 tablet (40 mg total) by mouth daily at 6 PM. 30 tablet 0  . bisacodyl (DULCOLAX) 5 MG EC tablet Take 5 mg by mouth daily.    . clopidogrel (PLAVIX) 75 MG tablet Take 1 tablet (75 mg total) by mouth daily. 30 tablet 0  . feeding supplement, ENSURE ENLIVE, (ENSURE ENLIVE) LIQD Take 237 mLs by mouth 2 (two) times daily between meals. 237 mL 12  . Maltodextrin-Xanthan Gum (RESOURCE THICKENUP CLEAR) POWD Use as needed    . Multiple  Vitamin (MULTIVITAMIN WITH MINERALS) TABS tablet Take 1 tablet by mouth daily.    . TRADJENTA 5 MG TABS tablet Take 5 mg by mouth daily.      No facility-administered medications prior to visit.     PAST MEDICAL HISTORY: Past Medical History:  Diagnosis Date  . Arthritis   . Diabetes mellitus    Type II  . Hypertension   . Stroke [facility])    no residual  weakness    PAST SURGICAL HISTORY: Past Surgical History:  Procedure Laterality Date  . TIBIA FRACTURE SURGERY  1987   Right, [facility]?    FAMILY HISTORY: Family History  Problem Relation Age of Onset  . Anesthesia problems Neg Hx   . Hypotension Neg Hx   . Malignant hyperthermia Neg Hx   . Pseudochol deficiency Neg Hx     SOCIAL HISTORY: Social History   Socioeconomic History  . Marital status: Single    Spouse name: Not on file  . Number of children: Not on file  . Years of education: Not on file  . Highest education level: Not on file  Occupational History  . Not on file  Social Needs  . Financial resource strain: Not on file  . Food insecurity:    Worry: Not on file    Inability: Not on file  . Transportation needs:    Medical: Not on file    Non-medical: Not on file  Tobacco Use  . Smoking status: Former Smoker    Years: 60.00    Types: Cigarettes    Last attempt to quit: 10/13/2005    Years since quitting: 12.2  . Smokeless tobacco: Never Used  Substance and Sexual Activity  . Alcohol use: No  . Drug use: No  . Sexual activity: Not on file  Lifestyle  . Physical activity:    Days per week: Not on file    Minutes per session: Not on file  . Stress: Not on file  Relationships  . Social connections:    Talks on phone: Not on file    Gets together: Not on file    Attends religious service: Not on file    Active member of club or organization: Not on file    Attends meetings of clubs or organizations: Not on file    Relationship status: Not on file  . Intimate partner violence:    Fear of current or ex partner: Not on file    Emotionally abused: Not on file    Physically abused: Not on file    Forced sexual activity: Not on file  Other Topics Concern  . Not on file  Social History Narrative  . Not on file     PHYSICAL EXAM  There were no vitals filed for this visit. There is no height or weight on file to calculate BMI.  Generalized: Well  developed, frail male in no acute distress  Head: normocephalic and atraumatic,. Oropharynx benign  Neck: Supple, no carotid bruits  Cardiac: Regular rate rhythm, no murmur  Musculoskeletal: No deformity   Neurological examination   Mentation: Alert mild cognitive impairment   Follows most  commands speech remains dysarthric   Cranial nerve II-XII: Fundoscopic exam reveals sharp disc margins.Pupils were equal round reactive to light extraocular movements were full, visual field were full on confrontational test.  Left facial droop .  Hard of hearing  Uvula tongue midline. head turning and shoulder shrug were normal and symmetric.Tongue protrusion into cheek strength was normal. Motor:  normal bulk and tone, full strength in the BUE, BLE, on the right.  3 out of 5 left upper extremity with increased muscle tone and contracture at left elbow.  Left lower extremity 4 out of 5 proximal and distal  Sensory: normal and symmetric to light touch, pinprick, and  Vibration, in the upper and lower extremities Coordination: finger-nose-finger, heel-to-shin bilaterally, normal on the right, unable to perform on the left Reflexes: 1+ upper lower and symmetric, plantar responses were flexor bilaterally. Gait and Station: He is in wheelchair not ambulated  DIAGNOSTIC DATA (LABS, IMAGING, TESTING) - I reviewed patient records, labs, notes, testing and imaging myself where available.  Lab Results  Component Value Date   WBC 4.0 12/30/2016   HGB 11.0 (L) 12/30/2016   HCT 34.8 (L) 12/30/2016   MCV 86.4 12/30/2016   PLT 344 12/30/2016      Component Value Date/Time   NA 142 12/30/2016 0405   K 4.6 12/30/2016 0405   CL 110 12/30/2016 0405   CO2 20 (L) 12/30/2016 0405   GLUCOSE 106 (H) 12/30/2016 0405   BUN 27 (H) 12/30/2016 0405   CREATININE 2.30 (H) 12/30/2016 0405   CALCIUM 8.7 (L) 12/30/2016 0405   PROT 8.1 12/25/2016 1905   ALBUMIN 3.5 12/25/2016 1905   AST 20 12/25/2016 1905   ALT 18  12/25/2016 1905   ALKPHOS 66 12/25/2016 1905   BILITOT 0.4 12/25/2016 1905   GFRNONAA 24 (L) 12/30/2016 0405   GFRAA 28 (L) 12/30/2016 0405   Lab Results  Component Value Date   CHOL 146 12/26/2016   HDL 30 (L) 12/26/2016   LDLCALC 102 (H) 12/26/2016   TRIG 70 12/26/2016   CHOLHDL 4.9 12/26/2016   Lab Results  Component Value Date   HGBA1C 7.1 (H) 12/26/2016   Lab Results  Component Value Date   VITAMINB12 262 01/09/2016   No results found for: TSH    ASSESSMENT AND PLAN 84 y.o. African American male with PMH of diabetes, hypertension,emphysema,CKD,and previous strokes with residual left spastic hemiparesis admitted on 12/25/16 for increased left-sided weakness. MRI showed acute patchy right MCA territory and right posterior watershed territory infarcts.  Also chronic lacunar infarcts in bilateral basal ganglia and thalami and brainstem as well as a few small remote right cerebellar infarcts.  MRA head unremarkable, carotid Doppler right ICA 60-79% stenosis.  EF 55-60%.  LDL 102 and A1c 7.1.  His stroke likely embolic from high-grade stenosis of right proximal ICA.  He was put on dual antiplatelet and Lipitor.  Not good candidate for CEA procedure due to advanced age, poor baseline and cognitive impairment. During the interval time, the patient has been doing much better.  Left-sided plegia.  Now on Plavix alone. The patient is a current patient of [name]  who is out of the office today . This note is sent to the work in doctor.     Plan:  Continue plavix for secondary stroke prevention Continue lipitor for stroke prevention Check BP and glucose daily at facility Encourage more walker and cane time to improve left strength.  Follow up with your primary care physician for stroke risk factor modification. Recommend maintain blood pressure goal 120-150/80, diabetes with hemoglobin A1c goal below 7.0% and Lipids with LDL cholesterol goal below 70 mg/dL.  Follow up in 6 months.  I  spent 25min in total face to face time with the patient/ sister  more than 50% of which was spent counseling and coordination of care, reviewing test  results reviewing medications and discussing and reviewing the diagnosis of stroke and management of risk factors.  Patient counseled on being compliant with medications Nilda Riggs, [facility], [facility], APRN  [facility] [address] [place], [place] [address] [phone]

## 2017-12-23 ENCOUNTER — Telehealth: Payer: Self-pay | Admitting: *Deleted

## 2017-12-23 ENCOUNTER — Ambulatory Visit: Payer: Medicare Other | Admitting: Nurse Practitioner

## 2017-12-23 NOTE — Telephone Encounter (Signed)
Patient was no show for follow up with NP today.  

## 2017-12-24 ENCOUNTER — Encounter: Payer: Self-pay | Admitting: Nurse Practitioner

## 2018-03-12 ENCOUNTER — Other Ambulatory Visit: Payer: Self-pay

## 2018-03-12 ENCOUNTER — Emergency Department (HOSPITAL_COMMUNITY)
Admission: EM | Admit: 2018-03-12 | Discharge: 2018-03-13 | Disposition: A | Discharge status: Skilled Nursing Facility | Payer: Medicare Other | Attending: Emergency Medicine | Admitting: Emergency Medicine

## 2018-03-12 ENCOUNTER — Encounter (HOSPITAL_COMMUNITY): Payer: Self-pay | Admitting: *Deleted

## 2018-03-12 DIAGNOSIS — Z8673 Personal history of transient ischemic attack (TIA), and cerebral infarction without residual deficits: Secondary | ICD-10-CM | POA: Diagnosis not present

## 2018-03-12 DIAGNOSIS — Z79899 Other long term (current) drug therapy: Secondary | ICD-10-CM | POA: Insufficient documentation

## 2018-03-12 DIAGNOSIS — N183 Chronic kidney disease, stage 3 (moderate): Secondary | ICD-10-CM | POA: Insufficient documentation

## 2018-03-12 DIAGNOSIS — W07XXXA Fall from chair, initial encounter: Secondary | ICD-10-CM | POA: Diagnosis not present

## 2018-03-12 DIAGNOSIS — Y939 Activity, unspecified: Secondary | ICD-10-CM | POA: Insufficient documentation

## 2018-03-12 DIAGNOSIS — S0990XA Unspecified injury of head, initial encounter: Secondary | ICD-10-CM | POA: Diagnosis present

## 2018-03-12 DIAGNOSIS — Z87891 Personal history of nicotine dependence: Secondary | ICD-10-CM | POA: Insufficient documentation

## 2018-03-12 DIAGNOSIS — Z7901 Long term (current) use of anticoagulants: Secondary | ICD-10-CM | POA: Insufficient documentation

## 2018-03-12 DIAGNOSIS — Y92129 Unspecified place in nursing home as the place of occurrence of the external cause: Secondary | ICD-10-CM | POA: Diagnosis not present

## 2018-03-12 DIAGNOSIS — S0083XA Contusion of other part of head, initial encounter: Secondary | ICD-10-CM

## 2018-03-12 DIAGNOSIS — Z7982 Long term (current) use of aspirin: Secondary | ICD-10-CM | POA: Diagnosis not present

## 2018-03-12 DIAGNOSIS — I129 Hypertensive chronic kidney disease with stage 1 through stage 4 chronic kidney disease, or unspecified chronic kidney disease: Secondary | ICD-10-CM | POA: Diagnosis not present

## 2018-03-12 DIAGNOSIS — E119 Type 2 diabetes mellitus without complications: Secondary | ICD-10-CM | POA: Diagnosis not present

## 2018-03-12 DIAGNOSIS — Y999 Unspecified external cause status: Secondary | ICD-10-CM | POA: Insufficient documentation

## 2018-03-12 DIAGNOSIS — N390 Urinary tract infection, site not specified: Secondary | ICD-10-CM | POA: Diagnosis not present

## 2018-03-12 HISTORY — DX: Dysarthria and anarthria: R47.1

## 2018-03-12 HISTORY — DX: Disorder of kidney and ureter, unspecified: N28.9

## 2018-03-12 HISTORY — DX: Dysphagia, unspecified: R13.10

## 2018-03-12 HISTORY — DX: Hemiplegia, unspecified affecting unspecified side: G81.90

## 2018-03-12 HISTORY — DX: Anemia, unspecified: D64.9

## 2018-03-12 HISTORY — DX: Cognitive communication deficit: R41.841

## 2018-03-12 NOTE — ED Triage Notes (Signed)
Pt is a resident of [facility] who arrived to er after staff found a hematoma to right forehead area, unsure of what happened, pt alert to baseline,

## 2018-03-13 ENCOUNTER — Emergency Department (HOSPITAL_COMMUNITY): Payer: Medicare Other

## 2018-03-13 DIAGNOSIS — S0083XA Contusion of other part of head, initial encounter: Secondary | ICD-10-CM | POA: Diagnosis not present

## 2018-03-13 LAB — URINALYSIS, ROUTINE W REFLEX MICROSCOPIC
BILIRUBIN URINE: NEGATIVE
Glucose, UA: >=500 mg/dL — AB
KETONES UR: NEGATIVE mg/dL
Nitrite: NEGATIVE
PROTEIN: NEGATIVE mg/dL
Specific Gravity, Urine: 1.022 (ref 1.005–1.030)
pH: 6 (ref 5.0–8.0)

## 2018-03-13 MED ORDER — CEPHALEXIN 500 MG PO CAPS: 500.0000 mg | ORAL_CAPSULE | Freq: Three times a day (TID) | ORAL | 0 refills | Status: AC

## 2018-03-13 MED ORDER — CEPHALEXIN 500 MG PO CAPS
500.0000 mg | ORAL_CAPSULE | Freq: Once | ORAL | Status: AC
  Administered 2018-03-13: 500 mg via ORAL
  Filled 2018-03-13: qty 1

## 2018-03-13 NOTE — ED Provider Notes (Signed)
[facility] EMERGENCY DEPARTMENT Provider Note   CSN: [id number] Arrival date & time: 03/12/18  2326  Time seen 23:59 PM   History   Chief Complaint Chief Complaint  Patient presents with  . Fall   Level 5 caveat for dysarthria  HPI Jerry Lane is a 84 y.o. male.  HPI patient presents from his nursing facility after he was found to have a hematoma on his forehead.  He tells myself or nursing staff he fell out of the chair.  He is unable to say when.  He denies having any pain.  Patient is on Plavix.  PCP Mirna Mires, MD   Past Medical History:  Diagnosis Date  . Anemia   . Arthritis   . Cognitive communication deficit   . Diabetes mellitus    Type II  . Dysarthria and anarthria   . Dysphagia   . Hemiparesis (HCC)    left side  . Hemiplegia (HCC)    left side  . Hypertension   . Renal disorder    chronic kidney disease  . Stroke [facility])    no residual weakness    Patient Active Problem List   Diagnosis Date Noted  . Left-sided weakness 06/17/2017  . History of stroke 02/17/2017  . Protein-calorie malnutrition, severe 12/29/2016  . Pressure injury of skin 12/27/2016  . Carotid stenosis, symptomatic, with infarction (HCC)   . Hyperlipidemia   . Cerebrovascular accident (CVA) due to stenosis of right carotid artery (HCC) 12/25/2016  . Anemia 01/09/2016  . CKD (chronic kidney disease) stage 3, GFR 30-59 ml/min (HCC) 01/09/2016  . Hyperkalemia 10/20/2010  . Renal failure 10/20/2010  . Type 2 diabetes mellitus with stage 3 chronic kidney disease, without long-term current use of insulin (HCC) 10/20/2010  . Essential hypertension 10/20/2010  . Cerebrovascular disease 10/20/2010    Past Surgical History:  Procedure Laterality Date  . TIBIA FRACTURE SURGERY  1987   Right, [facility]?        Home Medications    Prior to Admission medications   Medication Sig Start Date End Date Taking? Authorizing Provider  acetaminophen (TYLENOL) 500 MG tablet Take 500 mg  by mouth every 6 (six) hours as needed for mild pain or moderate pain.    [provider]  amLODipine (NORVASC) 2.5 MG tablet Take 2.5 mg by mouth daily.      [provider]  aspirin 325 MG tablet Take 1 tablet (325 mg total) by mouth daily. Patient not taking: Reported on 06/17/2017 12/30/16   Marguerita Merles Latif, DO  atorvastatin (LIPITOR) 40 MG tablet Take 1 tablet (40 mg total) by mouth daily at 6 PM. 12/30/16   Marguerita Merles Latif, DO  bisacodyl (DULCOLAX) 5 MG EC tablet Take 5 mg by mouth daily.    [provider]  cephALEXin (KEFLEX) 500 MG capsule Take 1 capsule (500 mg total) by mouth 3 (three) times daily. 03/13/18   Devoria Albe, MD  clopidogrel (PLAVIX) 75 MG tablet Take 1 tablet (75 mg total) by mouth daily. 12/30/16   Marguerita Merles Latif, DO  feeding supplement, ENSURE ENLIVE, (ENSURE ENLIVE) LIQD Take 237 mLs by mouth 2 (two) times daily between meals. 12/30/16   Marguerita Merles Latif, DO  Maltodextrin-Xanthan Gum (RESOURCE THICKENUP CLEAR) POWD Use as needed 12/30/16   Merlene Laughter, DO  Multiple Vitamin (MULTIVITAMIN WITH MINERALS) TABS tablet Take 1 tablet by mouth daily. 12/30/16   Sheikh, Omair Latif, DO  TRADJENTA 5 MG TABS tablet Take 5 mg  by mouth daily.  06/02/16   [provider]    Family History Family History  Problem Relation Age of Onset  . Anesthesia problems Neg Hx   . Hypotension Neg Hx   . Malignant hyperthermia Neg Hx   . Pseudochol deficiency Neg Hx     Social History Social History   Tobacco Use  . Smoking status: Former Smoker    Years: 60.00    Types: Cigarettes    Last attempt to quit: 10/13/2005    Years since quitting: 12.4  . Smokeless tobacco: Never Used  Substance Use Topics  . Alcohol use: No  . Drug use: No  lives in NH   Allergies   Patient has no known allergies.   Review of Systems Review of Systems  All other systems reviewed and are negative.    Physical Exam Updated Vital Signs BP (!)  146/80   Pulse 66   Temp 98.6 F (37 C) (Oral)   Resp 18   Ht 5\' 8"  (1.727 m)   Wt 59 kg   SpO2 94%   BMI 19.77 kg/m   Vital signs normal    Physical Exam  Constitutional: He appears well-developed and well-nourished. No distress.  HENT:  Head: Normocephalic.  Right Ear: External ear normal.  Left Ear: External ear normal.  Nose: Nose normal.  Pt has a hematoma of his right forehead. His mucus membranes are dry  Eyes: Pupils are equal, round, and reactive to light. Conjunctivae and EOM are normal.  Neck: Normal range of motion.  Cardiovascular: Normal rate and regular rhythm.  Pulmonary/Chest: Effort normal. No respiratory distress.  Musculoskeletal:  Left hemiparesis  Skin: Skin is warm and dry.  Psychiatric:  Flat affect, speech is thick and hard to understand  Nursing note and vitals reviewed.      ED Treatments / Results  Labs (all labs ordered are listed, but only abnormal results are displayed) Results for orders placed or performed during the hospital encounter of 03/12/18  Urinalysis, Routine w reflex microscopic  Result Value Ref Range   Color, Urine STRAW (A) YELLOW   APPearance HAZY (A) CLEAR   Specific Gravity, Urine 1.022 1.005 - 1.030   pH 6.0 5.0 - 8.0   Glucose, UA >=500 (A) NEGATIVE mg/dL   Hgb urine dipstick SMALL (A) NEGATIVE   Bilirubin Urine NEGATIVE NEGATIVE   Ketones, ur NEGATIVE NEGATIVE mg/dL   Protein, ur NEGATIVE NEGATIVE mg/dL   Nitrite NEGATIVE NEGATIVE   Leukocytes, UA LARGE (A) NEGATIVE   RBC / HPF 6-10 0 - 5 RBC/hpf   WBC, UA 21-50 0 - 5 WBC/hpf   Bacteria, UA RARE (A) NONE SEEN   Squamous Epithelial / LPF 0-5 0 - 5   WBC Clumps PRESENT    Laboratory interpretation all normal except possible UTI, urine culture sent    EKG None  Radiology Ct Head Wo Contrast  Result Date: 03/13/2018 CLINICAL DATA:  Initial evaluation for unwitnessed fall. EXAM: CT HEAD WITHOUT CONTRAST TECHNIQUE: Contiguous axial images were obtained  from the base of the skull through the vertex without intravenous contrast. COMPARISON:  Prior MRI from 12/26/2016. FINDINGS: Brain: Advanced age-related cerebral atrophy with chronic microvascular ischemic disease. Scatter remote lacunar infarcts present within the bilateral basal ganglia and thalami. Multiple small remote bilateral cerebellar infarcts. Additional lacunar infarct noted within the right paramedian pons. No acute intracranial hemorrhage. No acute large vessel territory infarct. No mass lesion, midline shift or mass effect. No hydrocephalus. No extra-axial  fluid collection. Vascular: No hyperdense vessel. Scattered vascular calcifications noted within the carotid siphons. Skull: Soft tissue contusion at the right forehead. Calvarium intact. Sinuses/Orbits: Globes and orbital soft tissues demonstrate no acute finding. Visualized paranasal sinuses and mastoid air cells are clear. Other: None. IMPRESSION: 1. No acute intracranial abnormality. 2. Soft tissue contusion at the right forehead. No calvarial fracture. 3. Advanced age-related cerebral atrophy with chronic microvascular ischemic disease. Electronically Signed   By: Rise Mu M.D.   On: 03/13/2018 01:47    Procedures Procedures (including critical care time)  Component 41yr ago  Specimen Description URINE, CLEAN CATCH   Special Requests NONE   Culture >=100,000 COLONIES/mL ESCHERICHIA COLIAbnormal    Report Status 12/28/2016 FINAL   Organism ID, Bacteria ESCHERICHIA COLIAbnormal    Resulting Agency [facility]  Susceptibility    Escherichia coli    MIC    AMPICILLIN <=2 SENSITIVE  Sensitive    AMPICILLIN/SULBACTAM <=2 SENSITIVE  Sensitive    CEFAZOLIN <=4 SENSITIVE  Sensitive    CEFTRIAXONE <=1 SENSITIVE  Sensitive    CIPROFLOXACIN <=0.25 SENS... Sensitive    Extended ESBL NEGATIVE  Sensitive    GENTAMICIN <=1 SENSITIVE  Sensitive    IMIPENEM <=0.25 SENS... Sensitive    NITROFURANTOIN <=16 SENSIT... Sensitive      PIP/TAZO <=4 SENSITIVE  Sensitive    TRIMETH/SULFA <=20 SENSIT... Sensitive        Medications Ordered in ED Medications  cephALEXin (KEFLEX) capsule 500 mg (has no administration in time range)     Initial Impression / Assessment and Plan / ED Course  I have reviewed the triage vital signs and the nursing notes.  Pertinent labs & imaging results that were available during my care of the patient were reviewed by me and considered in my medical decision making (see chart for details).     Head CT was ordered due to the obvious trauma to his head with unwitnessed fall.  Patient appears to be at his baseline.  Patient urine was compatible with UTI.  When I review his prior cultures the last and only one I can see is from December 25, 2016 where he grew over 100,000 colonies of E. coli that was sensitive to all antibiotics tested.  Patient was started on oral Keflex for his UTI, his head CT is without acute changes and he was discharged back to his facility.  Final Clinical Impressions(s) / ED Diagnoses   Final diagnoses:  Contusion of other part of head, initial encounter  Urinary tract infection without hematuria, site unspecified    ED Discharge Orders         Ordered    cephALEXin (KEFLEX) 500 MG capsule  3 times daily     03/13/18 0215        OTC  Acetaminophen  Plan discharge  Devoria Albe, MD, Concha Pyo, MD 03/13/18 [phone]

## 2018-03-13 NOTE — Discharge Instructions (Addendum)
You can use ice packs to the swollen area on his forehead.  He can have acetaminophen for pain if needed.  He did have a urinary tract infection, give him antibiotics until gone.  Have him rechecked if he gets fever, vomiting, or has a change of behavior or has any problems listed on the head injury sheet.  His urine culture can be rechecked in 2 days to make sure the antibiotic I chose will treat his infection.

## 2018-03-15 LAB — URINE CULTURE: SPECIAL REQUESTS: NORMAL

## 2018-03-16 ENCOUNTER — Emergency Department (HOSPITAL_COMMUNITY): Payer: Medicare Other

## 2018-03-16 ENCOUNTER — Other Ambulatory Visit: Payer: Self-pay

## 2018-03-16 ENCOUNTER — Telehealth: Payer: Self-pay | Admitting: Emergency Medicine

## 2018-03-16 ENCOUNTER — Encounter (HOSPITAL_COMMUNITY): Payer: Self-pay

## 2018-03-16 ENCOUNTER — Emergency Department (HOSPITAL_COMMUNITY)
Admission: EM | Admit: 2018-03-16 | Discharge: 2018-03-16 | Disposition: A | Discharge status: Home / Self Care | Payer: Medicare Other | Attending: Emergency Medicine | Admitting: Emergency Medicine

## 2018-03-16 DIAGNOSIS — Z794 Long term (current) use of insulin: Secondary | ICD-10-CM | POA: Diagnosis not present

## 2018-03-16 DIAGNOSIS — Z7902 Long term (current) use of antithrombotics/antiplatelets: Secondary | ICD-10-CM | POA: Diagnosis not present

## 2018-03-16 DIAGNOSIS — I129 Hypertensive chronic kidney disease with stage 1 through stage 4 chronic kidney disease, or unspecified chronic kidney disease: Secondary | ICD-10-CM | POA: Diagnosis not present

## 2018-03-16 DIAGNOSIS — E1165 Type 2 diabetes mellitus with hyperglycemia: Secondary | ICD-10-CM | POA: Insufficient documentation

## 2018-03-16 DIAGNOSIS — Z79899 Other long term (current) drug therapy: Secondary | ICD-10-CM | POA: Diagnosis not present

## 2018-03-16 DIAGNOSIS — R4182 Altered mental status, unspecified: Secondary | ICD-10-CM | POA: Diagnosis not present

## 2018-03-16 DIAGNOSIS — N183 Chronic kidney disease, stage 3 (moderate): Secondary | ICD-10-CM | POA: Diagnosis not present

## 2018-03-16 DIAGNOSIS — R739 Hyperglycemia, unspecified: Secondary | ICD-10-CM

## 2018-03-16 DIAGNOSIS — R569 Unspecified convulsions: Secondary | ICD-10-CM | POA: Diagnosis present

## 2018-03-16 HISTORY — DX: Hyperlipidemia, unspecified: E78.5

## 2018-03-16 LAB — CBC WITH DIFFERENTIAL/PLATELET
Abs Immature Granulocytes: 0.01 10*3/uL (ref 0.00–0.07)
BASOS ABS: 0 10*3/uL (ref 0.0–0.1)
Basophils Relative: 0 %
EOS ABS: 0.1 10*3/uL (ref 0.0–0.5)
EOS PCT: 3 %
HCT: 38 % — ABNORMAL LOW (ref 39.0–52.0)
HEMOGLOBIN: 11.4 g/dL — AB (ref 13.0–17.0)
Immature Granulocytes: 0 %
LYMPHS ABS: 0.7 10*3/uL (ref 0.7–4.0)
Lymphocytes Relative: 15 %
MCH: 27.5 pg (ref 26.0–34.0)
MCHC: 30 g/dL (ref 30.0–36.0)
MCV: 91.8 fL (ref 80.0–100.0)
Monocytes Absolute: 0.3 10*3/uL (ref 0.1–1.0)
Monocytes Relative: 7 %
NEUTROS ABS: 3.5 10*3/uL (ref 1.7–7.7)
NEUTROS PCT: 75 %
Platelets: 178 10*3/uL (ref 150–400)
RBC: 4.14 MIL/uL — AB (ref 4.22–5.81)
RDW: 12.3 % (ref 11.5–15.5)
WBC: 4.7 10*3/uL (ref 4.0–10.5)
nRBC: 0 % (ref 0.0–0.2)

## 2018-03-16 LAB — URINALYSIS, ROUTINE W REFLEX MICROSCOPIC
Bilirubin Urine: NEGATIVE
Glucose, UA: >=500 mg/dL — AB
KETONES UR: NEGATIVE mg/dL
Leukocytes, UA: NEGATIVE
Nitrite: NEGATIVE
Specific Gravity, Urine: 1.015 (ref 1.005–1.030)
pH: 5.5 (ref 5.0–8.0)

## 2018-03-16 LAB — URINALYSIS, MICROSCOPIC (REFLEX)

## 2018-03-16 LAB — COMPREHENSIVE METABOLIC PANEL
ALT: 14 U/L (ref 0–44)
AST: 18 U/L (ref 15–41)
Albumin: 3.4 g/dL — ABNORMAL LOW (ref 3.5–5.0)
Alkaline Phosphatase: 119 U/L (ref 38–126)
Anion gap: 11 (ref 5–15)
BUN: 44 mg/dL — ABNORMAL HIGH (ref 8–23)
CHLORIDE: 102 mmol/L (ref 98–111)
CO2: 18 mmol/L — ABNORMAL LOW (ref 22–32)
Calcium: 8.1 mg/dL — ABNORMAL LOW (ref 8.9–10.3)
Creatinine, Ser: 2.91 mg/dL — ABNORMAL HIGH (ref 0.61–1.24)
GFR calc non Af Amer: 18 mL/min — ABNORMAL LOW (ref >60)
GFR, EST AFRICAN AMERICAN: 21 mL/min — AB (ref >60)
Glucose, Bld: 453 mg/dL — ABNORMAL HIGH (ref 70–99)
Potassium: 4.5 mmol/L (ref 3.5–5.1)
Sodium: 131 mmol/L — ABNORMAL LOW (ref 135–145)
Total Bilirubin: 0.5 mg/dL (ref 0.3–1.2)
Total Protein: 7.6 g/dL (ref 6.5–8.1)

## 2018-03-16 LAB — CBG MONITORING, ED
GLUCOSE-CAPILLARY: 421 mg/dL — AB (ref 70–99)
Glucose-Capillary: 359 mg/dL — ABNORMAL HIGH (ref 70–99)
Glucose-Capillary: 336 mg/dL — ABNORMAL HIGH (ref 70–99)
Glucose-Capillary: 249 mg/dL — ABNORMAL HIGH (ref 70–99)

## 2018-03-16 LAB — MAGNESIUM: Magnesium: 2.1 mg/dL (ref 1.7–2.4)

## 2018-03-16 MED ORDER — INSULIN ASPART 100 UNIT/ML ~~LOC~~ SOLN
8.0000 [IU] | Freq: Once | SUBCUTANEOUS | Status: AC
  Administered 2018-03-16: 8 [IU] via INTRAVENOUS
  Filled 2018-03-16: qty 1

## 2018-03-16 MED ORDER — SODIUM CHLORIDE 0.9 % IV BOLUS
1000.0000 mL | Freq: Once | INTRAVENOUS | Status: AC
  Administered 2018-03-16: 1000 mL via INTRAVENOUS

## 2018-03-16 MED ORDER — SODIUM CHLORIDE 0.9 % IV SOLN
INTRAVENOUS | Status: DC
  Administered 2018-03-16: 17:00:00 via INTRAVENOUS

## 2018-03-16 NOTE — ED Notes (Signed)
Patient transported to CT 

## 2018-03-16 NOTE — ED Provider Notes (Signed)
[facility] EMERGENCY DEPARTMENT Provider Note   CSN: [id number] Arrival date & time: 03/16/18  1542     History   Chief Complaint Chief Complaint  Patient presents with  . Seizures    HPI Jerry Lane is a 84 y.o. male.  Pt presents to the ED today with a possible seizure.  Per EMS, pt may have had a seizure, but no one witnessed it.  They thought he had one because he was "post-ictal."  EMS was told that pt has a hx of seizures, but I don't see this listed on his chart.  Nor is he on any antiepileptics.  EMS did say that his blood sugar was elevated.  The staff at the facility said pt has been refusing his insulin and blood sugar checks.  Pt is awake and following commands.  He denies any pain.  He was here on 12/7 for a fall and was also diagnosed with a UTI.  He was put on keflex.  Culture grew out group b strep.  It is sensitive to keflex, so abx were not changed.     Past Medical History:  Diagnosis Date  . Anemia   . Arthritis   . Cognitive communication deficit   . Diabetes mellitus    Type II  . Dysarthria and anarthria   . Dysarthria and anarthria   . Dysphagia   . Hemiparesis (HCC)    left side  . Hemiplegia (HCC)    left side  . Hyperlipidemia   . Hypertension   . Renal disorder    chronic kidney disease  . Stroke [facility])    no residual weakness    Patient Active Problem List   Diagnosis Date Noted  . Left-sided weakness 06/17/2017  . History of stroke 02/17/2017  . Protein-calorie malnutrition, severe 12/29/2016  . Pressure injury of skin 12/27/2016  . Carotid stenosis, symptomatic, with infarction (HCC)   . Hyperlipidemia   . Cerebrovascular accident (CVA) due to stenosis of right carotid artery (HCC) 12/25/2016  . Anemia 01/09/2016  . CKD (chronic kidney disease) stage 3, GFR 30-59 ml/min (HCC) 01/09/2016  . Hyperkalemia 10/20/2010  . Renal failure 10/20/2010  . Type 2 diabetes mellitus with stage 3 chronic kidney disease, without long-term  current use of insulin (HCC) 10/20/2010  . Essential hypertension 10/20/2010  . Cerebrovascular disease 10/20/2010    Past Surgical History:  Procedure Laterality Date  . TIBIA FRACTURE SURGERY  1987   Right, [facility]?        Home Medications    Prior to Admission medications   Medication Sig Start Date End Date Taking? Authorizing Provider  acetaminophen (TYLENOL) 500 MG tablet Take 500 mg by mouth every 6 (six) hours as needed for mild pain or moderate pain.   Yes [provider]  amLODipine (NORVASC) 2.5 MG tablet Take 2.5 mg by mouth daily.     Yes [provider]  atorvastatin (LIPITOR) 40 MG tablet Take 1 tablet (40 mg total) by mouth daily at 6 PM. 12/30/16  Yes Sheikh, Omair Latif, DO  bisacodyl (DULCOLAX) 5 MG EC tablet Take 5 mg by mouth daily.   Yes [provider]  cephALEXin (KEFLEX) 500 MG capsule Take 1 capsule (500 mg total) by mouth 3 (three) times daily. Patient taking differently: Take 500 mg by mouth 3 (three) times daily. 7 day course starting on 03/14/2018 03/13/18  Yes Devoria Albe, MD  cinacalcet (SENSIPAR) 30 MG tablet Take 30 mg by mouth daily.  Yes [provider]  clopidogrel (PLAVIX) 75 MG tablet Take 1 tablet (75 mg total) by mouth daily. 12/30/16  Yes Sheikh, Omair Latif, DO  insulin glargine (LANTUS) 100 UNIT/ML injection Inject 30 Units into the skin every morning.   Yes [provider]  insulin lispro (HUMALOG) 100 UNIT/ML injection Inject 2-10 Units into the skin 3 (three) times daily before meals. Sliding scale; 201-250= 2 units 251-300= 4 units 301-350= 6 units 351-400= 8 units 401-450=10units   Yes [provider]  Multiple Vitamin (MULTIVITAMIN WITH MINERALS) TABS tablet Take 1 tablet by mouth daily. 12/30/16  Yes Sheikh, Omair Latif, DO  TRADJENTA 5 MG TABS tablet Take 5 mg by mouth daily.  06/02/16  Yes [provider]  Vitamin D, Ergocalciferol, (DRISDOL) 1.25 MG (50000 UT) CAPS capsule  Take 50,000 Units by mouth every 7 (seven) days.   Yes [provider]    Family History Family History  Problem Relation Age of Onset  . Anesthesia problems Neg Hx   . Hypotension Neg Hx   . Malignant hyperthermia Neg Hx   . Pseudochol deficiency Neg Hx     Social History Social History   Tobacco Use  . Smoking status: Former Smoker    Years: 60.00    Types: Cigarettes    Last attempt to quit: 10/13/2005    Years since quitting: 12.4  . Smokeless tobacco: Never Used  Substance Use Topics  . Alcohol use: No  . Drug use: No     Allergies   Patient has no known allergies.   Review of Systems Review of Systems  All other systems reviewed and are negative.    Physical Exam Updated Vital Signs BP (!) 144/76   Pulse 77   Temp (!) 97.5 F (36.4 C) (Oral)   Resp 14   SpO2 95%   Physical Exam  Constitutional: He appears well-developed and well-nourished.  HENT:  Head: Normocephalic and atraumatic.  Right Ear: External ear normal.  Left Ear: External ear normal.  Nose: Nose normal.  Mouth/Throat: Oropharynx is clear and moist.  Eyes: Pupils are equal, round, and reactive to light. Conjunctivae and EOM are normal.  Neck: Normal range of motion. Neck supple.  Cardiovascular: Normal rate, regular rhythm, normal heart sounds and intact distal pulses.  Pulmonary/Chest: Effort normal and breath sounds normal.  Abdominal: Soft. Bowel sounds are normal.  Musculoskeletal: Normal range of motion.  Neurological: He is alert.  Left sided weakness (chronic from stroke)  Skin: Skin is warm. Capillary refill takes less than 2 seconds.  Psychiatric: He has a normal mood and affect.  Nursing note and vitals reviewed.    ED Treatments / Results  Labs (all labs ordered are listed, but only abnormal results are displayed) Labs Reviewed  CBC WITH DIFFERENTIAL/PLATELET - Abnormal; Notable for the following components:      Result Value   RBC 4.14 (*)    Hemoglobin  11.4 (*)    HCT 38.0 (*)    All other components within normal limits  COMPREHENSIVE METABOLIC PANEL - Abnormal; Notable for the following components:   Sodium 131 (*)    CO2 18 (*)    Glucose, Bld 453 (*)    BUN 44 (*)    Creatinine, Ser 2.91 (*)    Calcium 8.1 (*)    Albumin 3.4 (*)    GFR calc non Af Amer 18 (*)    GFR calc Af Amer 21 (*)    All other components within  normal limits  URINALYSIS, ROUTINE W REFLEX MICROSCOPIC - Abnormal; Notable for the following components:   Glucose, UA >=500 (*)    Hgb urine dipstick SMALL (*)    Protein, ur TRACE (*)    All other components within normal limits  URINALYSIS, MICROSCOPIC (REFLEX) - Abnormal; Notable for the following components:   Bacteria, UA RARE (*)    All other components within normal limits  CBG MONITORING, ED - Abnormal; Notable for the following components:   Glucose-Capillary 421 (*)    All other components within normal limits  CBG MONITORING, ED - Abnormal; Notable for the following components:   Glucose-Capillary 359 (*)    All other components within normal limits  CBG MONITORING, ED - Abnormal; Notable for the following components:   Glucose-Capillary 336 (*)    All other components within normal limits  CBG MONITORING, ED - Abnormal; Notable for the following components:   Glucose-Capillary 249 (*)    All other components within normal limits  MAGNESIUM    EKG EKG Interpretation  Date/Time:  Wednesday March 16 2018 15:54:55 EST Ventricular Rate:  62 PR Interval:    QRS Duration: 162 QT Interval:  480 QTC Calculation: 488 R Axis:   -52 Text Interpretation:  sinus rhythm Prolonged PR interval RBBB and LAFB Baseline wander in lead(s) V2 No significant change since last tracing Confirmed by Jacalyn Lefevre [phone]) on 03/16/2018 4:06:09 PM   Radiology Ct Head Wo Contrast  Result Date: 03/16/2018 CLINICAL DATA:  Seizure today. Altered mental status. History of stroke. EXAM: CT HEAD WITHOUT CONTRAST  TECHNIQUE: Contiguous axial images were obtained from the base of the skull through the vertex without intravenous contrast. COMPARISON:  CT HEAD March 13, 2018 FINDINGS: Mild motion degraded examination. BRAIN: No intraparenchymal hemorrhage, mass effect nor midline shift. Old pontine, bilateral cerebellar small infarcts. Old basal ganglia and thalami lacunar infarcts. Bifrontal and RIGHT parietal encephalomalacia. Patchy to confluent supratentorial white matter hypodensities. Moderate global parenchymal brain volume loss. No hydrocephalus. No acute large vascular territory infarcts. No abnormal extra-axial fluid collections. VASCULAR: Mild calcific atherosclerosis of the carotid siphons. SKULL: No skull fracture. Old nasal bone fractures. Small frontal scalp hematoma. SINUSES/ORBITS: Trace paranasal sinus mucosal thickening. Mastoid air cells are well aerated.The included ocular globes and orbital contents are non-suspicious. OTHER: Patient is edentulous. IMPRESSION: 1. Motion degraded examination.  No acute intracranial process. 2. Old bilateral MCA territory infarcts. 3. Stable moderate chronic small vessel ischemic changes. Stable multiple old small supra and infratentorial infarcts. Electronically Signed   By: Awilda Metro M.D.   On: 03/16/2018 17:28   Dg Chest Port 1 View  Result Date: 03/16/2018 CLINICAL DATA:  Seizure EXAM: PORTABLE CHEST 1 VIEW COMPARISON:  CT chest 12/25/2016, radiograph 12/25/2016, 07/13/2016 FINDINGS: Rotated patient. Left greater than right apical pleural and parenchymal scarring. Enlarged cardiomediastinal silhouette with tortuous ectatic aorta. No pneumothorax. IMPRESSION: 1. Similar appearance of volume loss in the left upper lobe with left greater than right apical pleural and parenchymal scar 2. Mild cardiomegaly Electronically Signed   By: Jasmine Pang M.D.   On: 03/16/2018 16:08    Procedures Procedures (including critical care time)  Medications Ordered in  ED Medications  sodium chloride 0.9 % bolus 1,000 mL (0 mLs Intravenous Stopped 03/16/18 1705)    And  0.9 %  sodium chloride infusion ( Intravenous New Bag/Given 03/16/18 1703)  insulin aspart (novoLOG) injection 8 Units (8 Units Intravenous Given 03/16/18 1641)  insulin aspart (novoLOG) injection 8 Units (8  Units Intravenous Given 03/16/18 2005)     Initial Impression / Assessment and Plan / ED Course  I have reviewed the triage vital signs and the nursing notes.  Pertinent labs & imaging results that were available during my care of the patient were reviewed by me and considered in my medical decision making (see chart for details).    Pt has been calm and cooperative while here.  He has allowed us to do everything we need to do.  The history of possible seizure is a little confusing.  No one saw this seizure and thought he had one because he was a little confused.  If someone actually witnesses one, then he will need to see neurology.  No signs of infection other than urine which is getting treated and looks better.    Blood sugar has improved.  Pt is stable for d/c.  Return if worse.  Final Clinical Impressions(s) / ED Diagnoses   Final diagnoses:  Hyperglycemia    ED Discharge Orders    None       Jacalyn LefevreHaviland, Tymesha Ditmore, MD 03/16/18 2103

## 2018-03-16 NOTE — Telephone Encounter (Signed)
Post ED Visit - Positive Culture Follow-up  Culture report reviewed by antimicrobial stewardship pharmacist:  []  Enzo BiNathan Batchelder, Pharm.D. []  Celedonio MiyamotoJeremy Frens, Pharm.D., BCPS AQ-ID []  Garvin FilaMike Maccia, Pharm.D., BCPS []  Georgina PillionElizabeth Martin, [address]Ph[address] BCPS []  BancroftMinh Pham, [address]Pharm[address]D., BCPS, AAHIVP []  Estella HuskMichelle Turner, Pharm.D., BCPS, AAHIVP [x]  Lysle Pearlachel Rumbarger, PharmD, BCPS []  Phillips Climeshuy Dang, PharmD, BCPS []  Agapito GamesAlison Masters, PharmD, BCPS []  Verlan FriendsErin Deja, PharmD  Positive urine culture Treated with cephalexin, organism sensitive to the same and no further patient follow-up is required at this time.  Berle MullMiller, Deni Berti 03/16/2018, 1:37 PM

## 2018-03-16 NOTE — ED Triage Notes (Signed)
EMS reports pt is a resident at [facility].  Reports pt had a seizure today and has had altered LOC since then.  Reports pt usually alert and combative.  Pt alert and verbal but not combative at this time.  EMS says pt has history of seizures.

## 2018-03-16 NOTE — ED Notes (Signed)
Phlebotomy at bedside.

## 2018-03-16 NOTE — ED Notes (Signed)
Pt becoming more alert. Pt able to verbalize name. Pt calm and cooperative at this time.

## 2018-03-16 NOTE — ED Notes (Signed)
X-ray at bedside

## 2019-03-16 IMAGING — DX DG CHEST 2V
2 series · 2 of 2 positions shown · non-contrast
Comparison: 01/26/2005

CLINICAL DATA: Weakness

EXAM:
CHEST  2 VIEW

[chest lat]
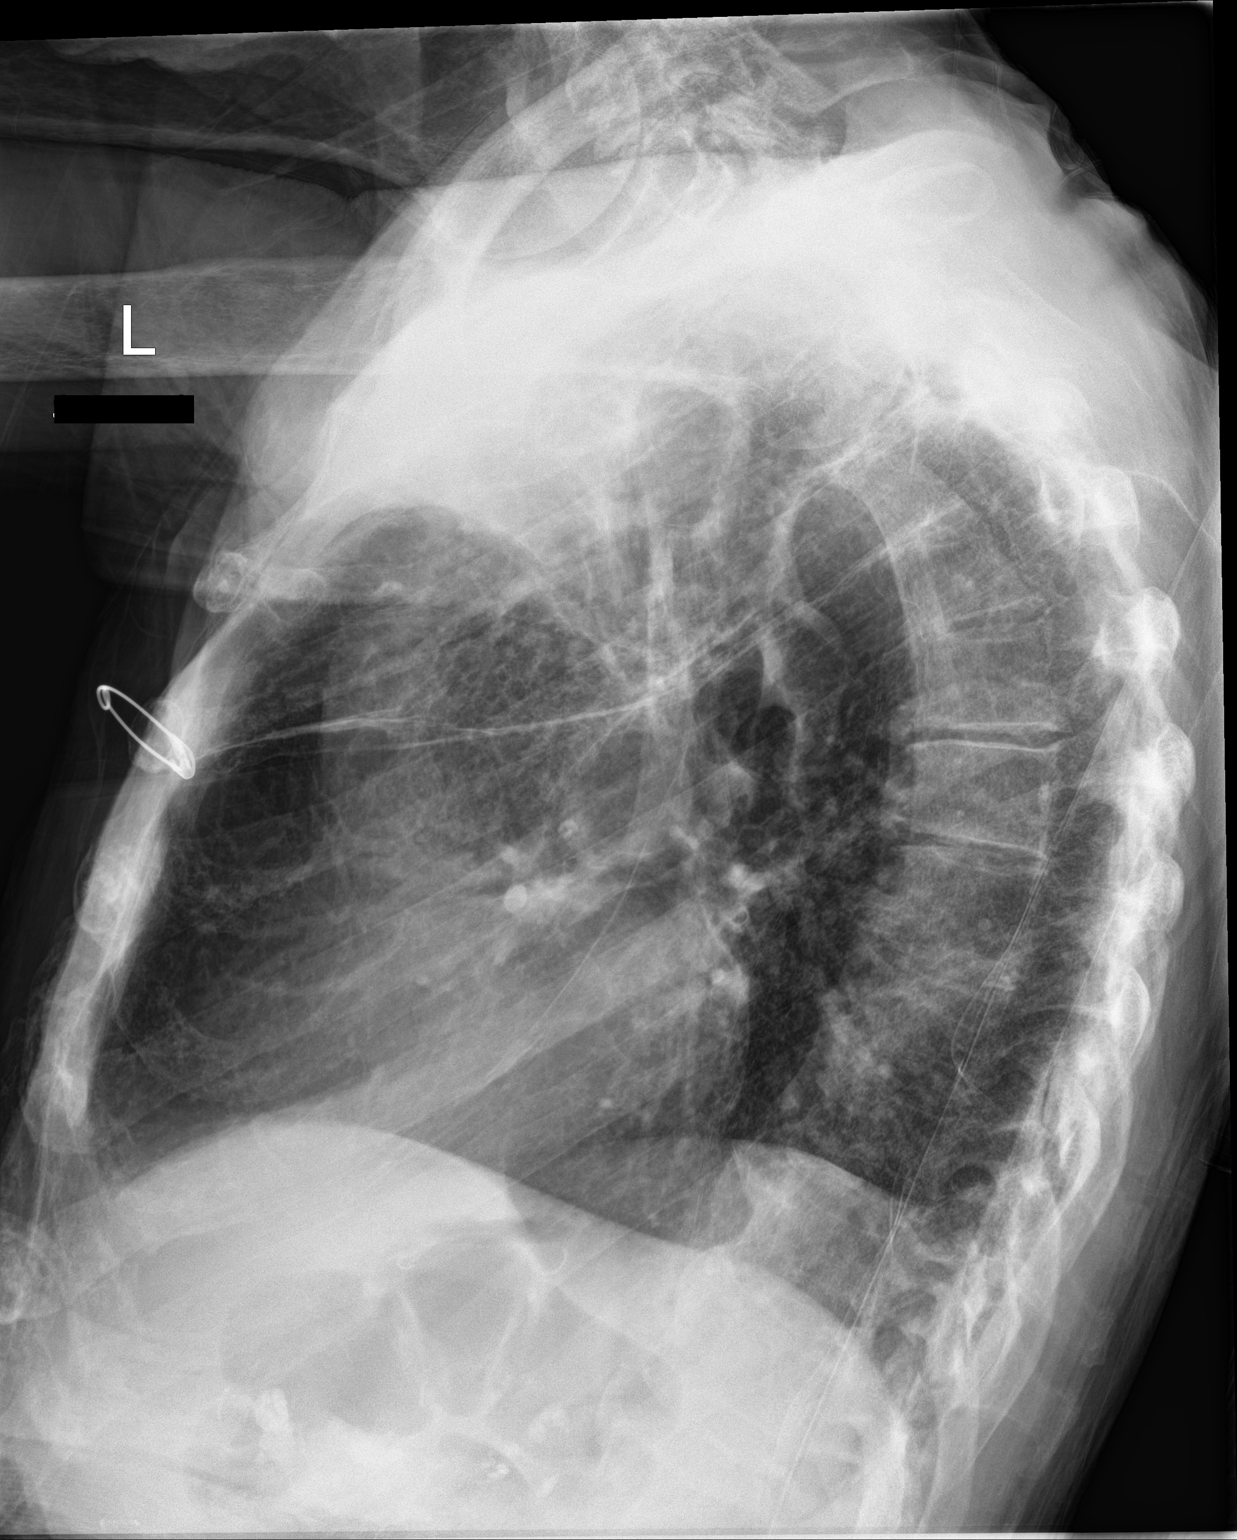

[chest ap]
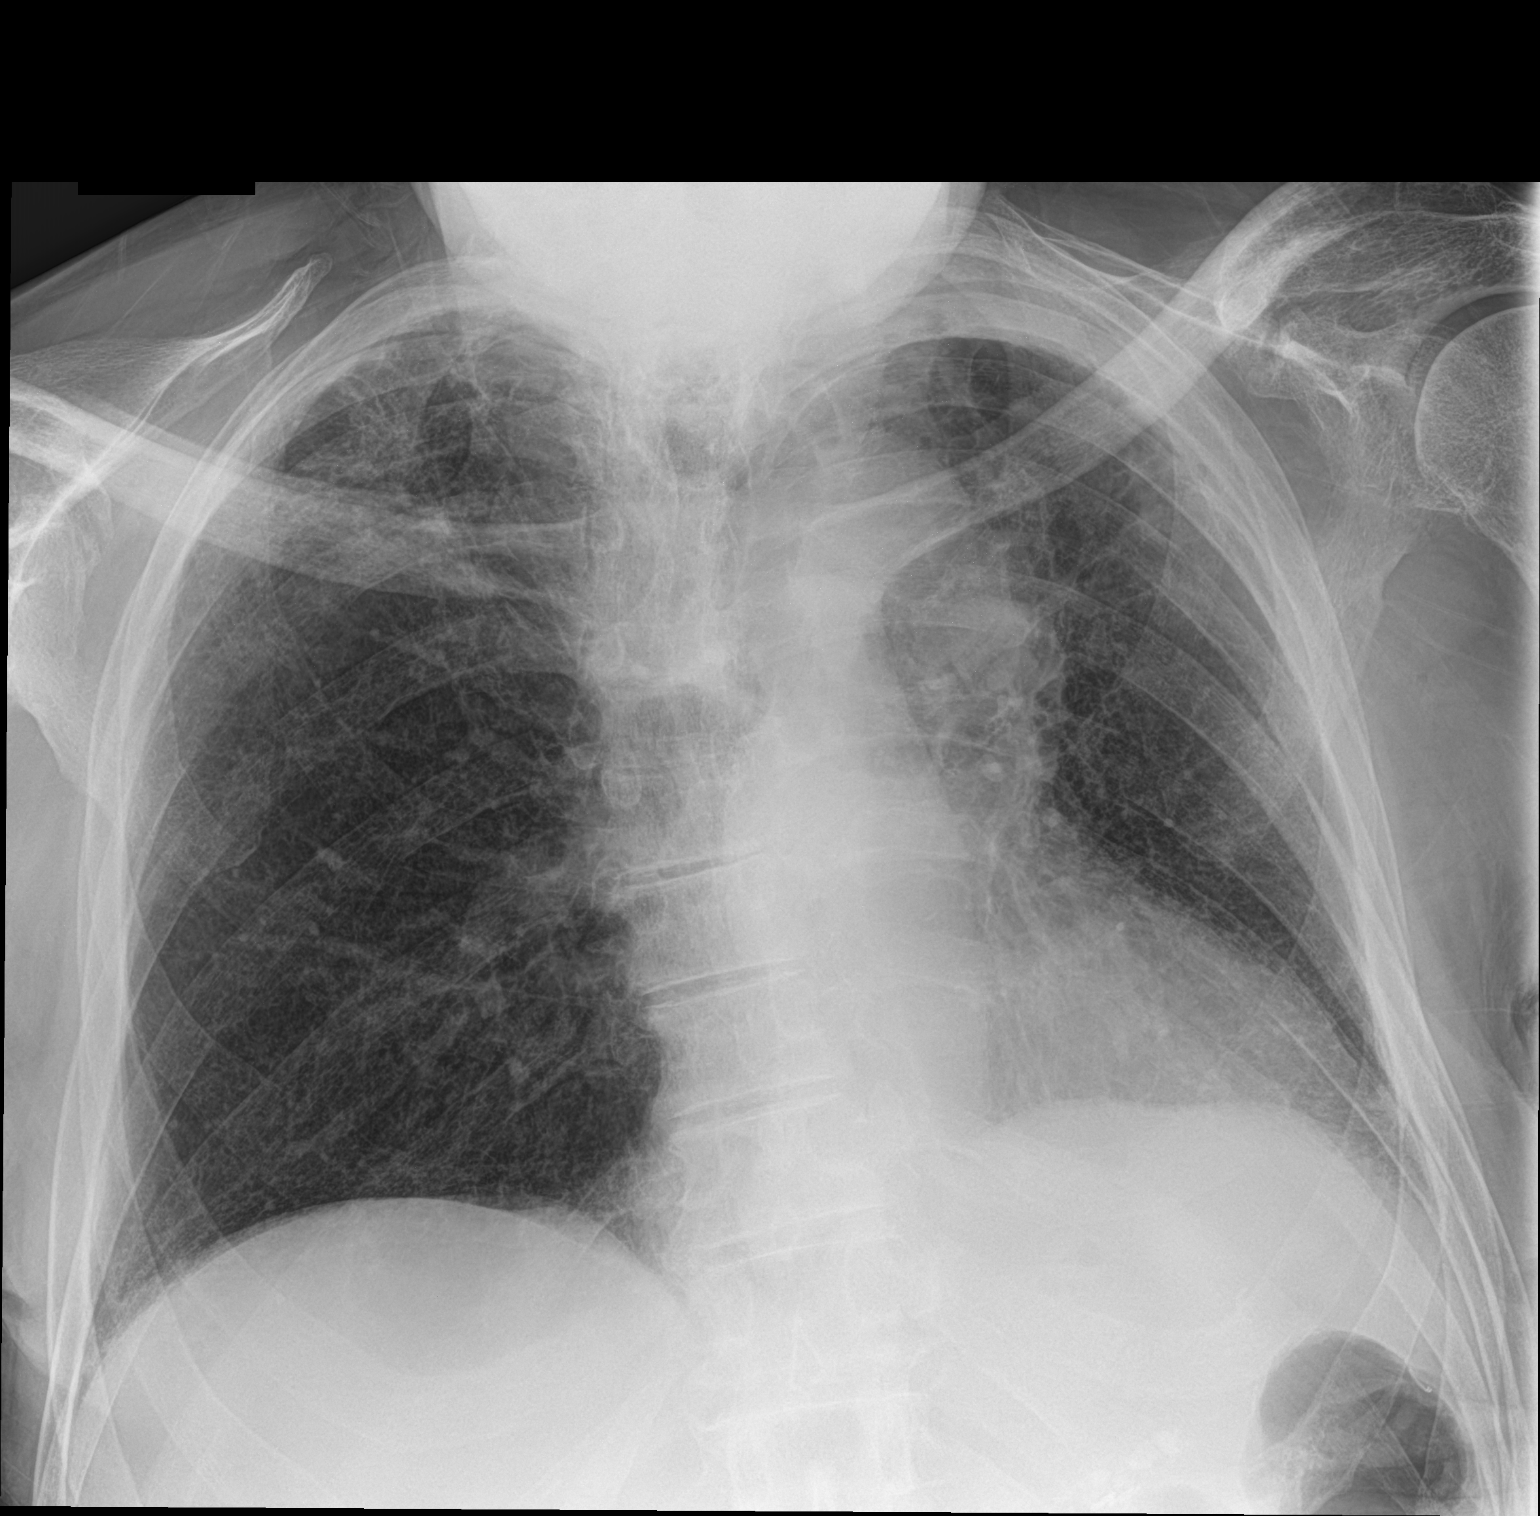

[2 of 2 positions shown; findings below may reference images not displayed]

FINDINGS: Cardiac shadow is stable. Mild aortic calcifications are seen. The
lungs are well aerated bilaterally with chronic interstitial
changes. No focal confluent infiltrate is seen. Scarring is noted in
the apices bilaterally. No effusion or acute bony abnormality is
noted.
IMPRESSION: Chronic changes without acute abnormality.

## 2019-08-28 IMAGING — CT CT HEAD CODE STROKE
3 series · 14 of 47 positions shown, 16 images · non-contrast
Comparison: Prior CT from 07/13/2016.

CLINICAL DATA: Code stroke. Initial evaluation for increase
left-sided weakness for 4 hours.

EXAM:
CT HEAD WITHOUT CONTRAST
TECHNIQUE: Contiguous axial images were obtained from the base of the skull
through the vertex without intravenous contrast.

[Series 2: head code stroke wo · axial · 0.48mm/px · z∈[+68,+218]mm · 8 of 36 slices shown, 10 images]
[im 3/36  brain]
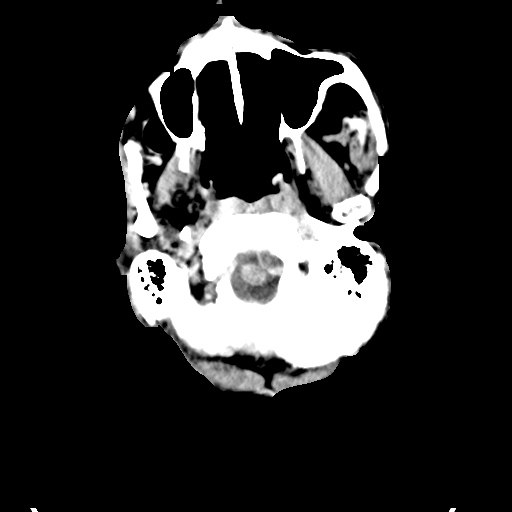
[im 3/36  bone]
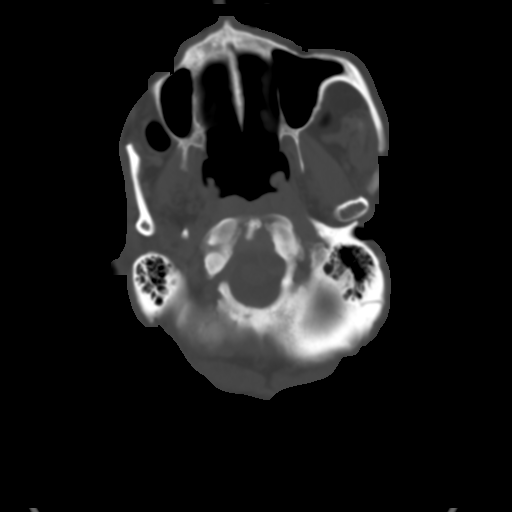
[im 8/36  brain]
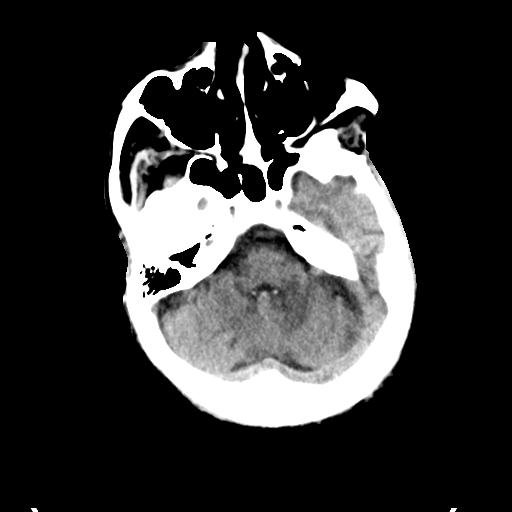
[im 11/36  brain]
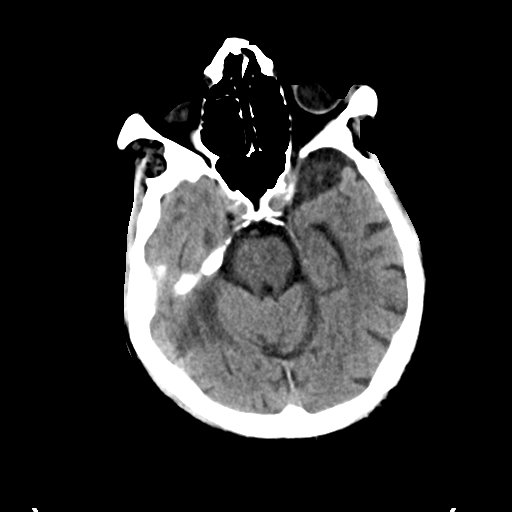
[im 16/36  brain]
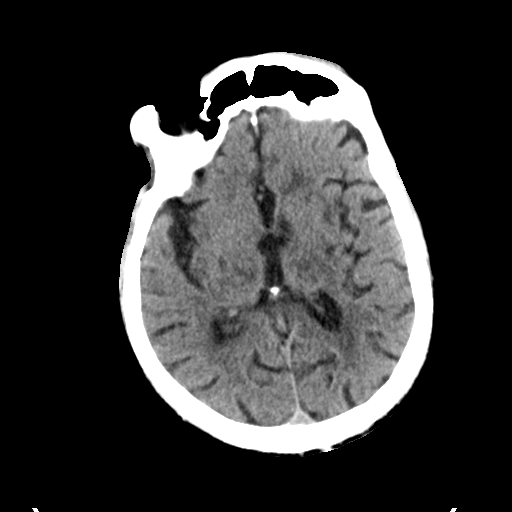
[im 20/36  brain]
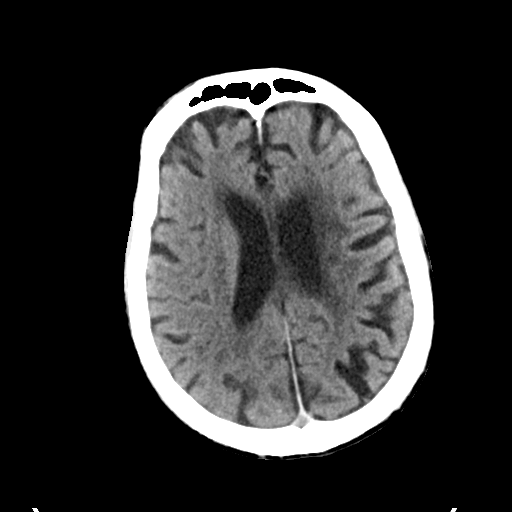
[im 20/36  bone]
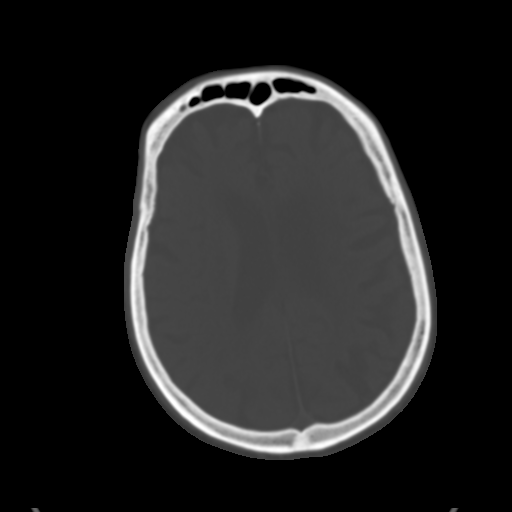
[im 25/36  brain]
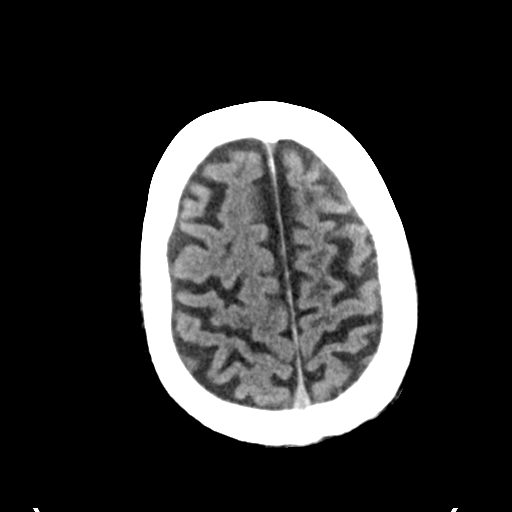
[im 28/36  brain]
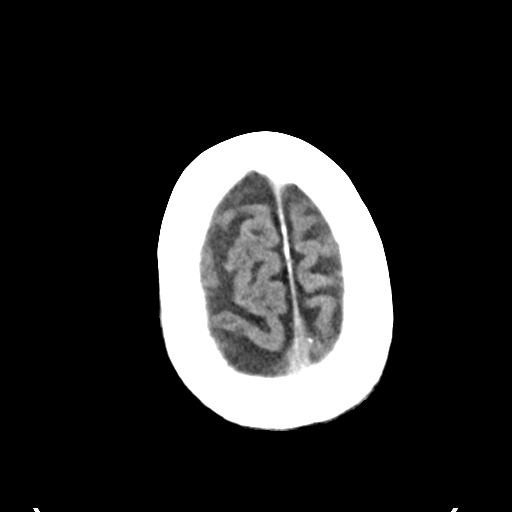
[im 33/36  brain]
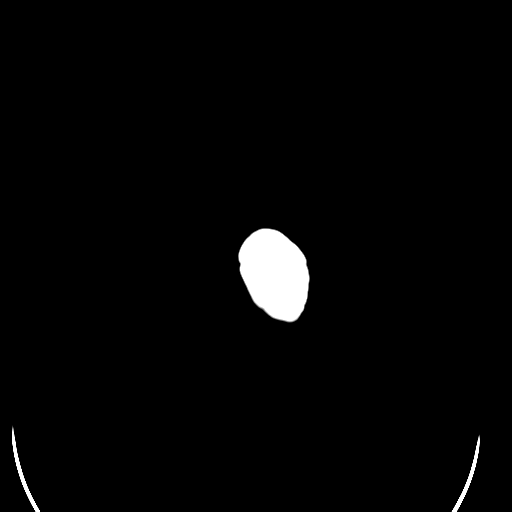

[Series 4: coronal soft tissue · coronal · 0.43mm/px · 3 of 72 slices shown]
[im 24/72  brain]
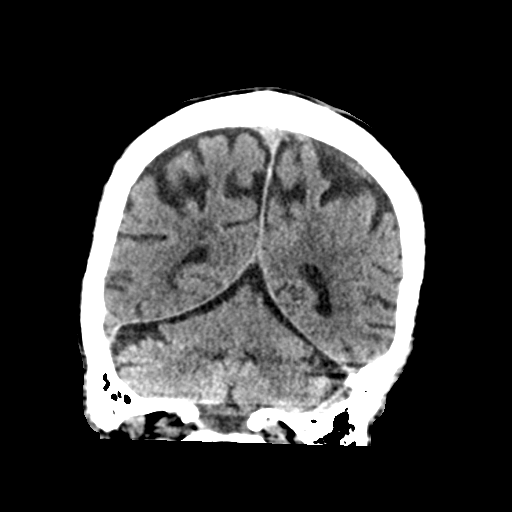
[im 32/72  brain]
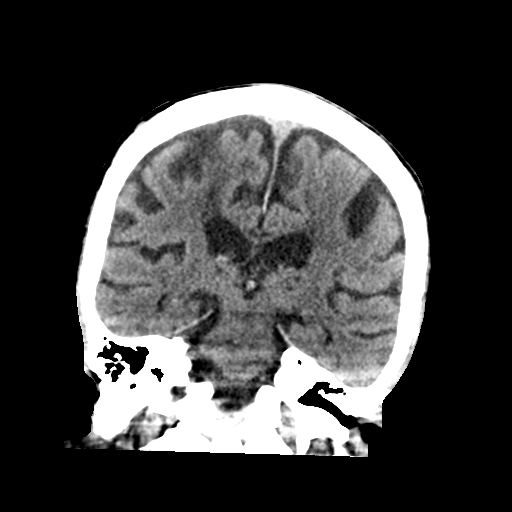
[im 40/72  brain]
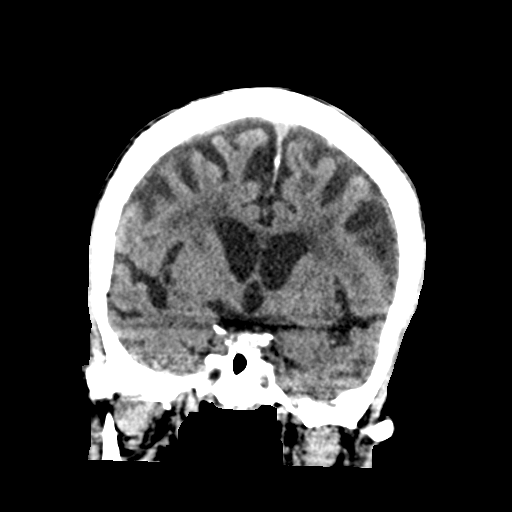

[Series 5: sagittal soft tissue · sagittal · 0.40mm/px · 3 of 59 slices shown]
[im 20/59  brain]
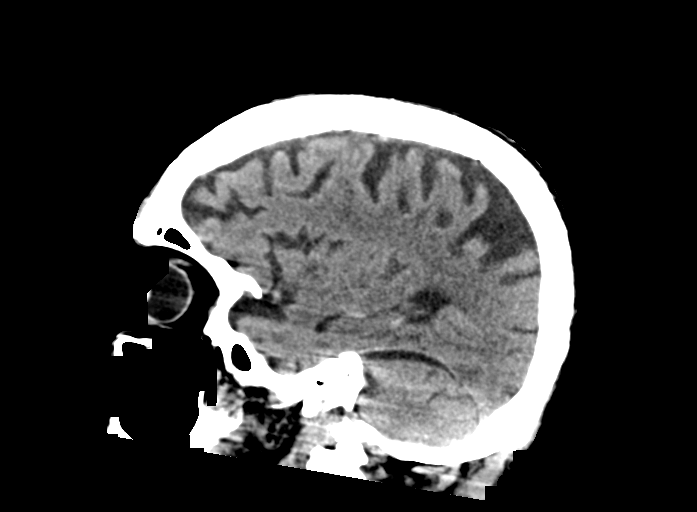
[im 30/59  brain]
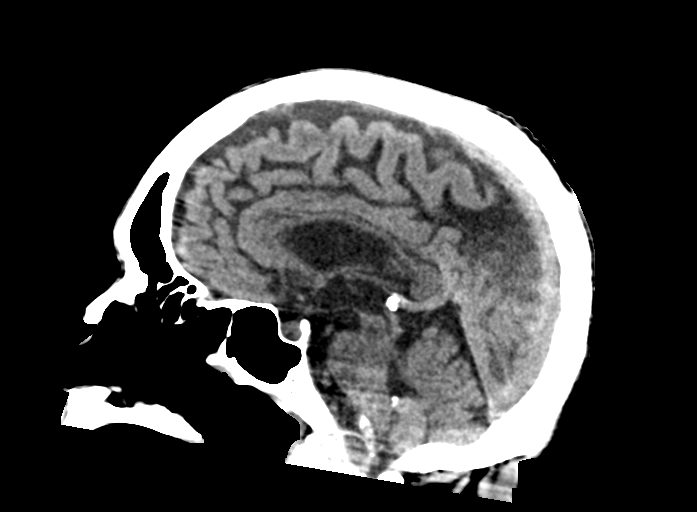
[im 39/59  brain]
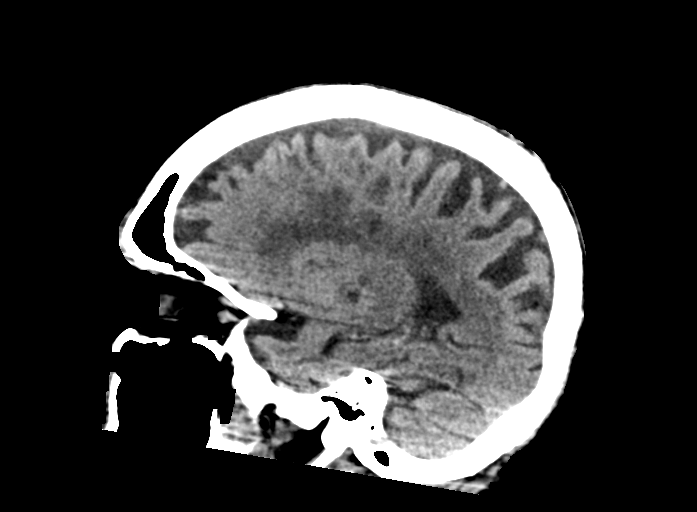

[14 of 47 positions shown; findings below may reference images not displayed]

FINDINGS: Brain: Advanced age-related cerebral atrophy with chronic
microvascular ischemic disease. Encephalomalacia within the anterior
left frontal lobe compatible with remote left MCA territory infarct.
Multiple remote lacunar infarcts present within the bilateral basal
ganglia and thalami as well as the brainstem. Few small remote right
cerebellar infarcts.

There is question of a tiny approximate 1 cm hypodensity involving
the cortical gray matter of the posterior left frontal lobe,
precentral gyrus/ motor strip, which may reflect an evolving tiny
acute ischemic infarct (series 2, image 29). Finding not entirely
certain given the small size.

No other definite acute large vessel territory infarct. No acute
intracranial hemorrhage. No mass lesion or midline shift. No mass
effect. No hydrocephalus. No extra-axial fluid collection.

Vascular: No worrisome hyperdense vessel. Prominent vascular
calcifications noted within the carotid siphons and distal vertebral
artery's.

Skull: Scalp soft tissues and calvarium within normal limits.

Sinuses/Orbits: Globes and orbital soft tissues within normal
limits. Paranasal sinuses are clear. Small right mastoid effusion
noted.

Other: None.

ASPECTS (Alberta Stroke Program Early CT Score)

- Ganglionic level infarction (caudate, lentiform nuclei, internal
capsule, insula, M1-M3 cortex): 7

- Supraganglionic infarction (M4-M6 cortex): 3

Total score (0-10 with 10 being normal): 10
IMPRESSION: 1. Question tiny approximate 1 cm evolving hypodensity at the
posterior right frontal lobe, precentral gyrus, which may reflect a
tiny acute cortical ischemic infarct. Finding could be further
assessed with dedicated MRI as desired. No acute intracranial
hemorrhage.
2. ASPECTS is 10
3. Otherwise stable atrophy and chronic microvascular ischemic
disease with multiple remote infarcts as above.

Critical Value/emergent results were called by telephone at the time
of interpretation on 12/25/2016 at [DATE] to Dr. MASHISHI YAYA , who
verbally acknowledged these results.

## 2019-08-28 IMAGING — CT CT CHEST W/O CM
2 of 3 series · 15 of 36 positions shown, 18 images · non-contrast
Comparison: 07/08/2004

CLINICAL DATA: Altered mental status. Interstitial lung disease.
Increased weakness to the left side and now has difficulty moving
the left side.

EXAM:
CT CHEST WITHOUT CONTRAST
TECHNIQUE: Multidetector CT imaging of the chest was performed following the
standard protocol without IV contrast.

[Series 2: thorax · axial · 0.72mm/px · z∈[+1119,+1375]mm · 12 of 152 slices shown, 15 images]
[im 12/152  mediastinal]
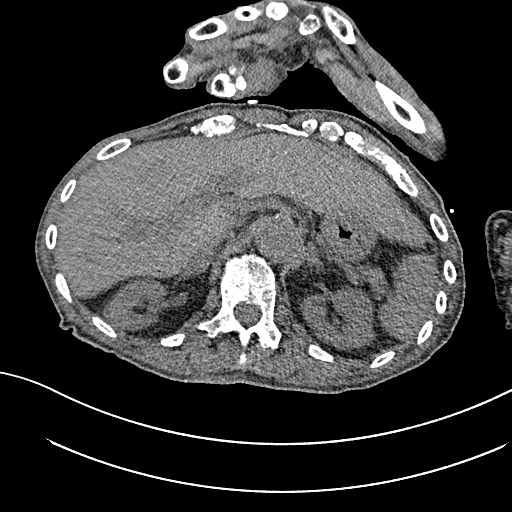
[im 12/152  lung]
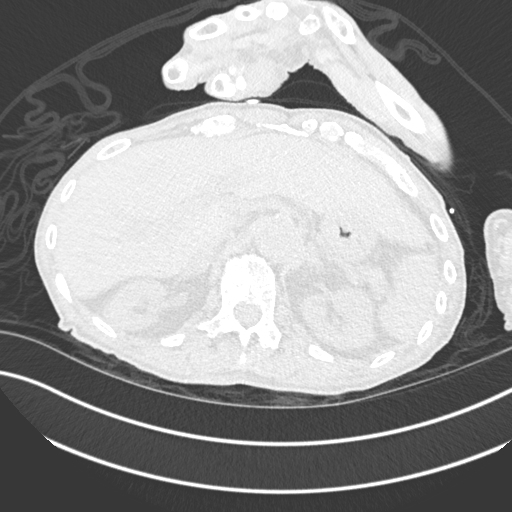
[im 23/152  lung]
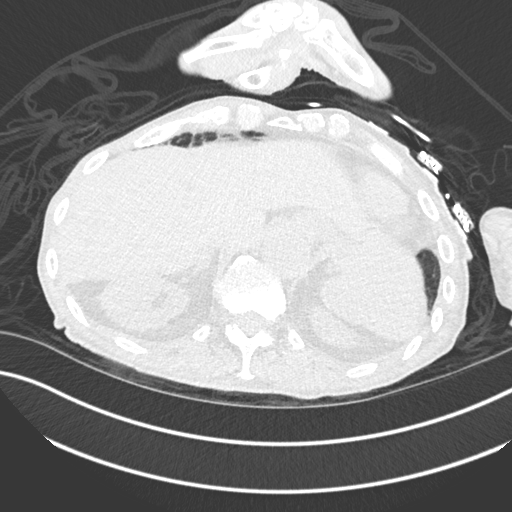
[im 34/152  lung]
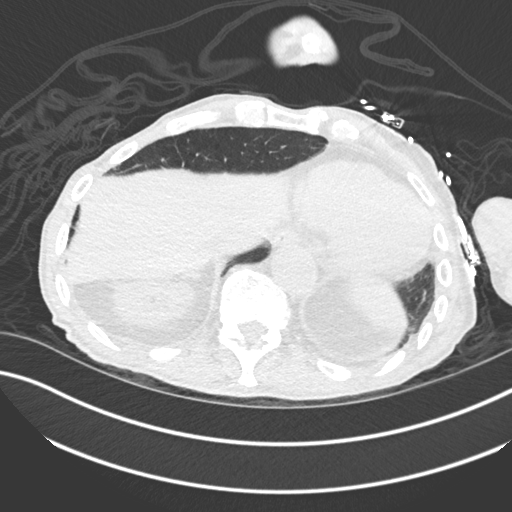
[im 45/152  lung]
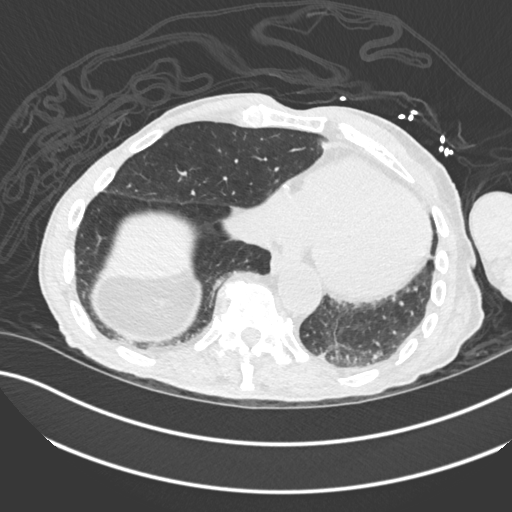
[im 56/152  mediastinal]
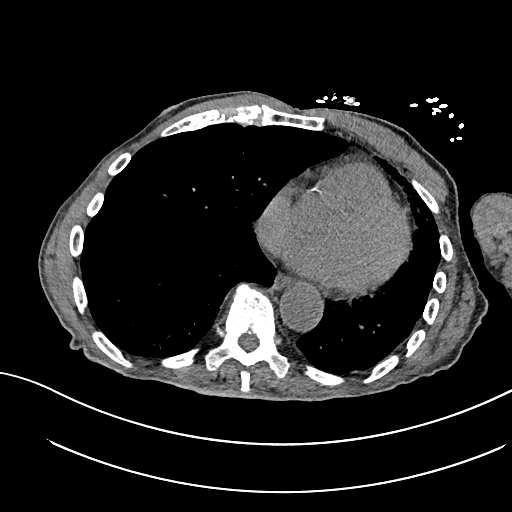
[im 56/152  lung]
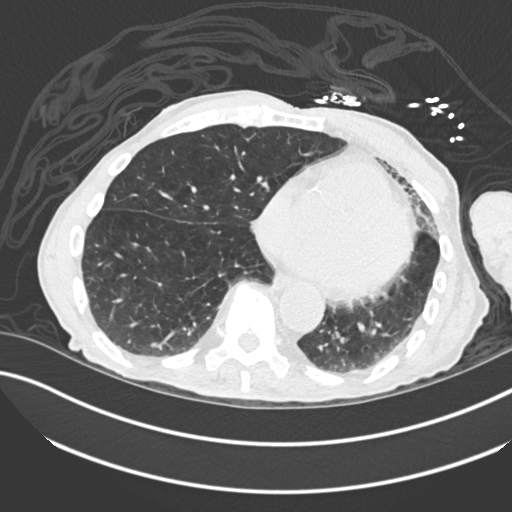
[im 68/152  lung]
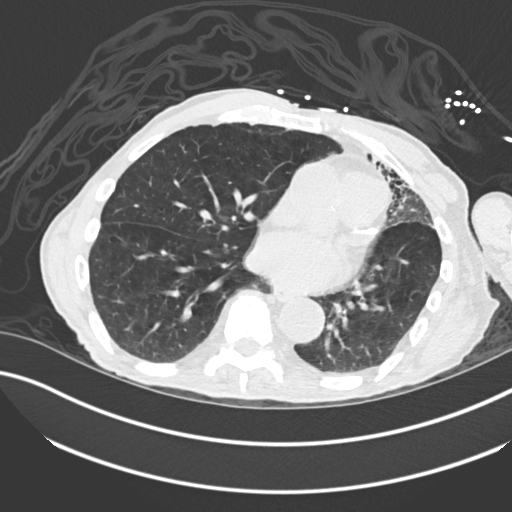
[im 84/152  lung]
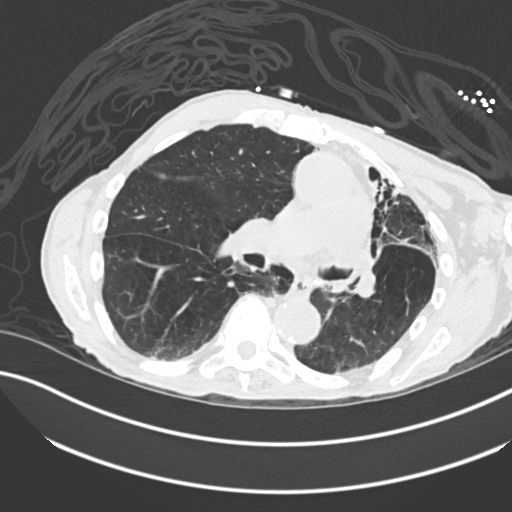
[im 96/152  lung]
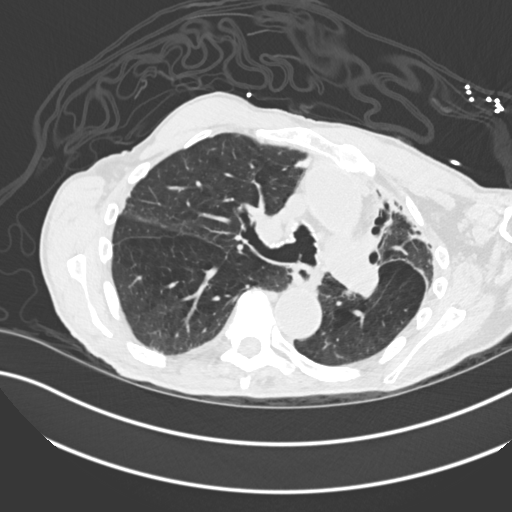
[im 107/152  mediastinal]
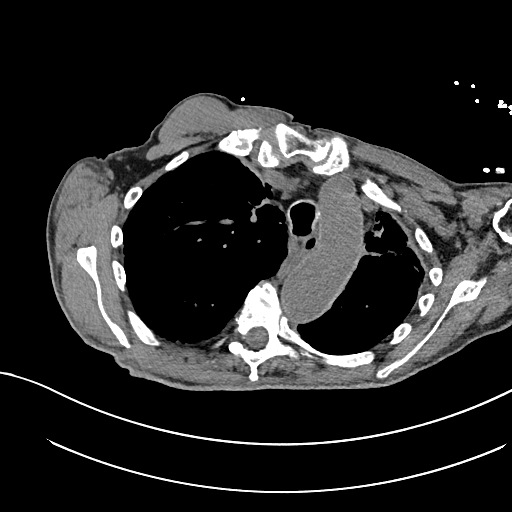
[im 107/152  lung]
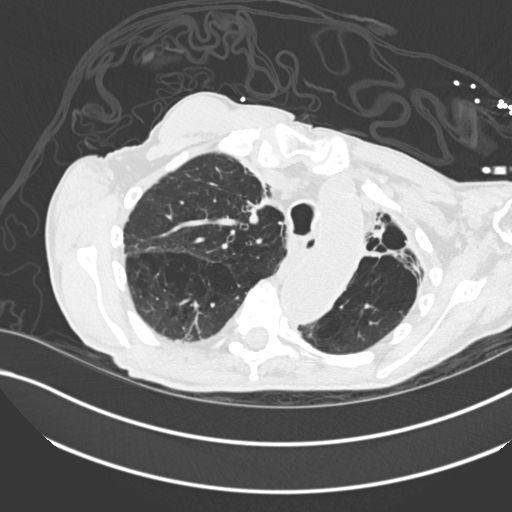
[im 118/152  lung]
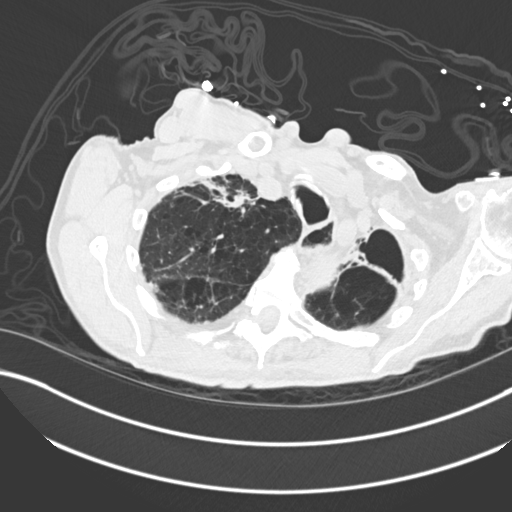
[im 129/152  lung]
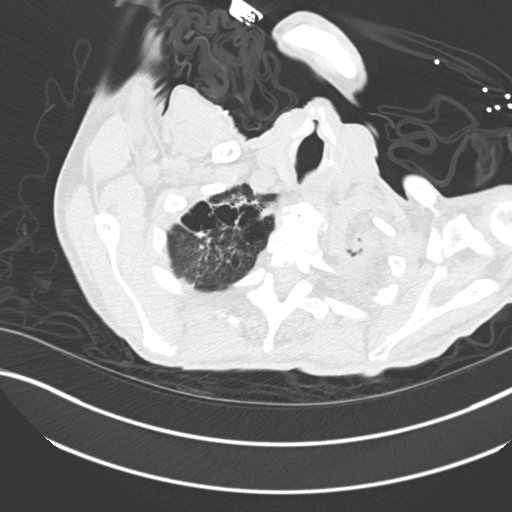
[im 140/152  lung]
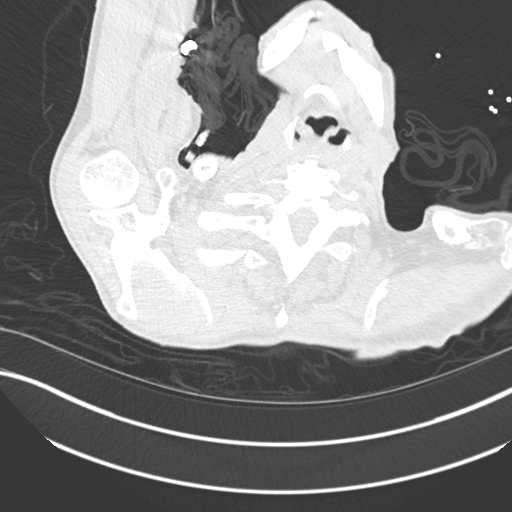

[Series 5: coronal · coronal · 0.68mm/px · 3 of 140 slices shown]
[im 28/140  lung]
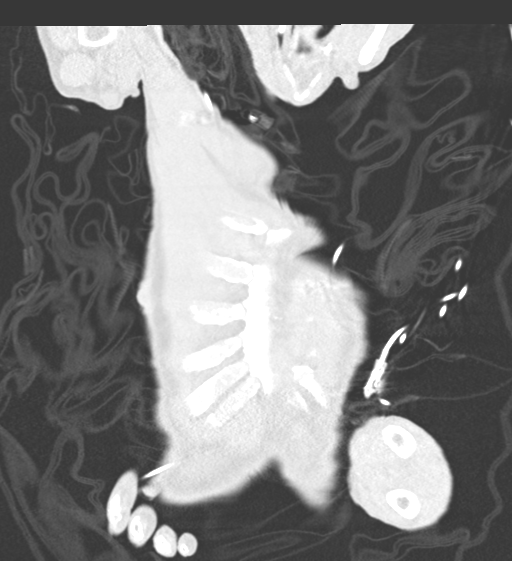
[im 56/140  lung]
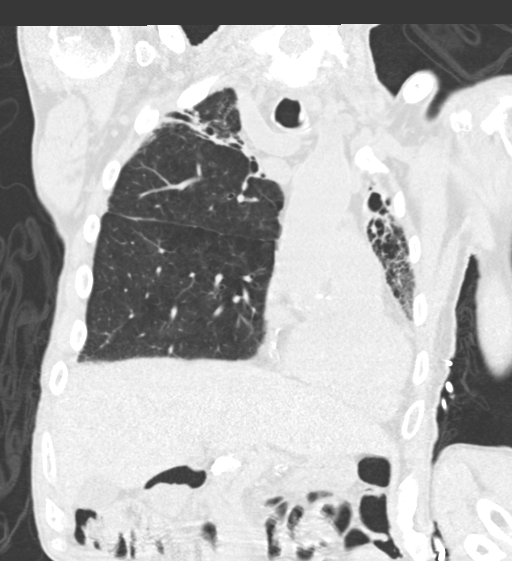
[im 84/140  lung]
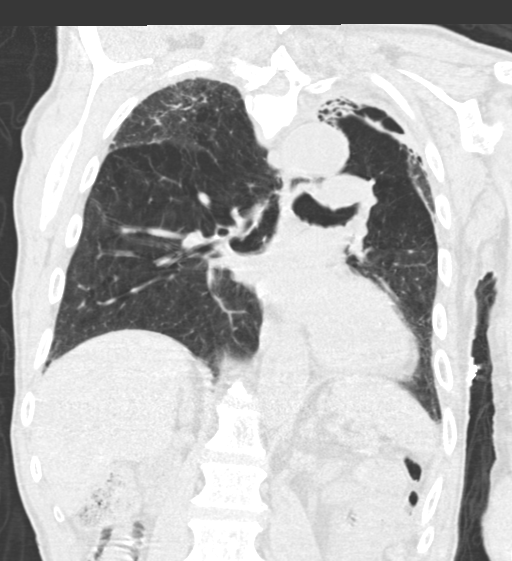

[15 of 36 positions shown; findings below may reference images not displayed]

FINDINGS: Cardiovascular: Normal heart size. No pericardial effusion. Coronary
artery and aortic calcifications. Diffusely ectatic thoracic aorta.
Ascending aorta measures 3.6 cm diameter. Descending aorta measures
3.2 cm diameter.

Mediastinum/Nodes: Esophagus is decompressed. Scattered mediastinal
lymph nodes are not pathologically enlarged. No abnormal mediastinal
fluid collection or gas.

Lungs/Pleura: There is chronic left apical pleural thickening,
scarring, and volume loss in the left upper lung with scarring and
honeycomb changes. This appearance is similar to the previous study
without significant progression. Scarring and emphysematous changes
also in the right upper lung as before. No definite evidence of any
developing mass or acute consolidation. No pleural effusions. No
pneumothorax. Diffuse emphysematous changes throughout the lungs.

Upper Abdomen: Configuration of the liver suggest possible hepatic
cirrhosis. Diffuse calcification throughout the pancreas consistent
with chronic pancreatitis. No acute process suggested in the
visualized upper abdomen.

Musculoskeletal: Degenerative changes in the spine. No destructive
bone lesions.
IMPRESSION: 1. Diffuse emphysematous change throughout the lungs. Chronic
bilateral apical scarring, chronic pleural thickening and volume
loss in the left upper lung with scarring and honeycomb changes. No
significant change since 5669. No evidence of acute consolidation.
2. A ectatic thoracic aorta. Aortic and coronary artery
calcifications.
3. Probable changes of hepatic cirrhosis. Pancreatic calcification
consistent with chronic pancreatitis.

Aortic Atherosclerosis (9G95X-XKX.X) and Emphysema (9G95X-D3Y.Z).

## 2019-08-28 IMAGING — CR DG CHEST 1V PORT
1 series · 2 of 2 positions shown · non-contrast
Comparison: 07/13/2016

CLINICAL DATA: Cough

EXAM:
PORTABLE CHEST 1 VIEW

[Series 1: portable · 0.17mm/px · 2 of 2 slices shown]
[im 1/2]
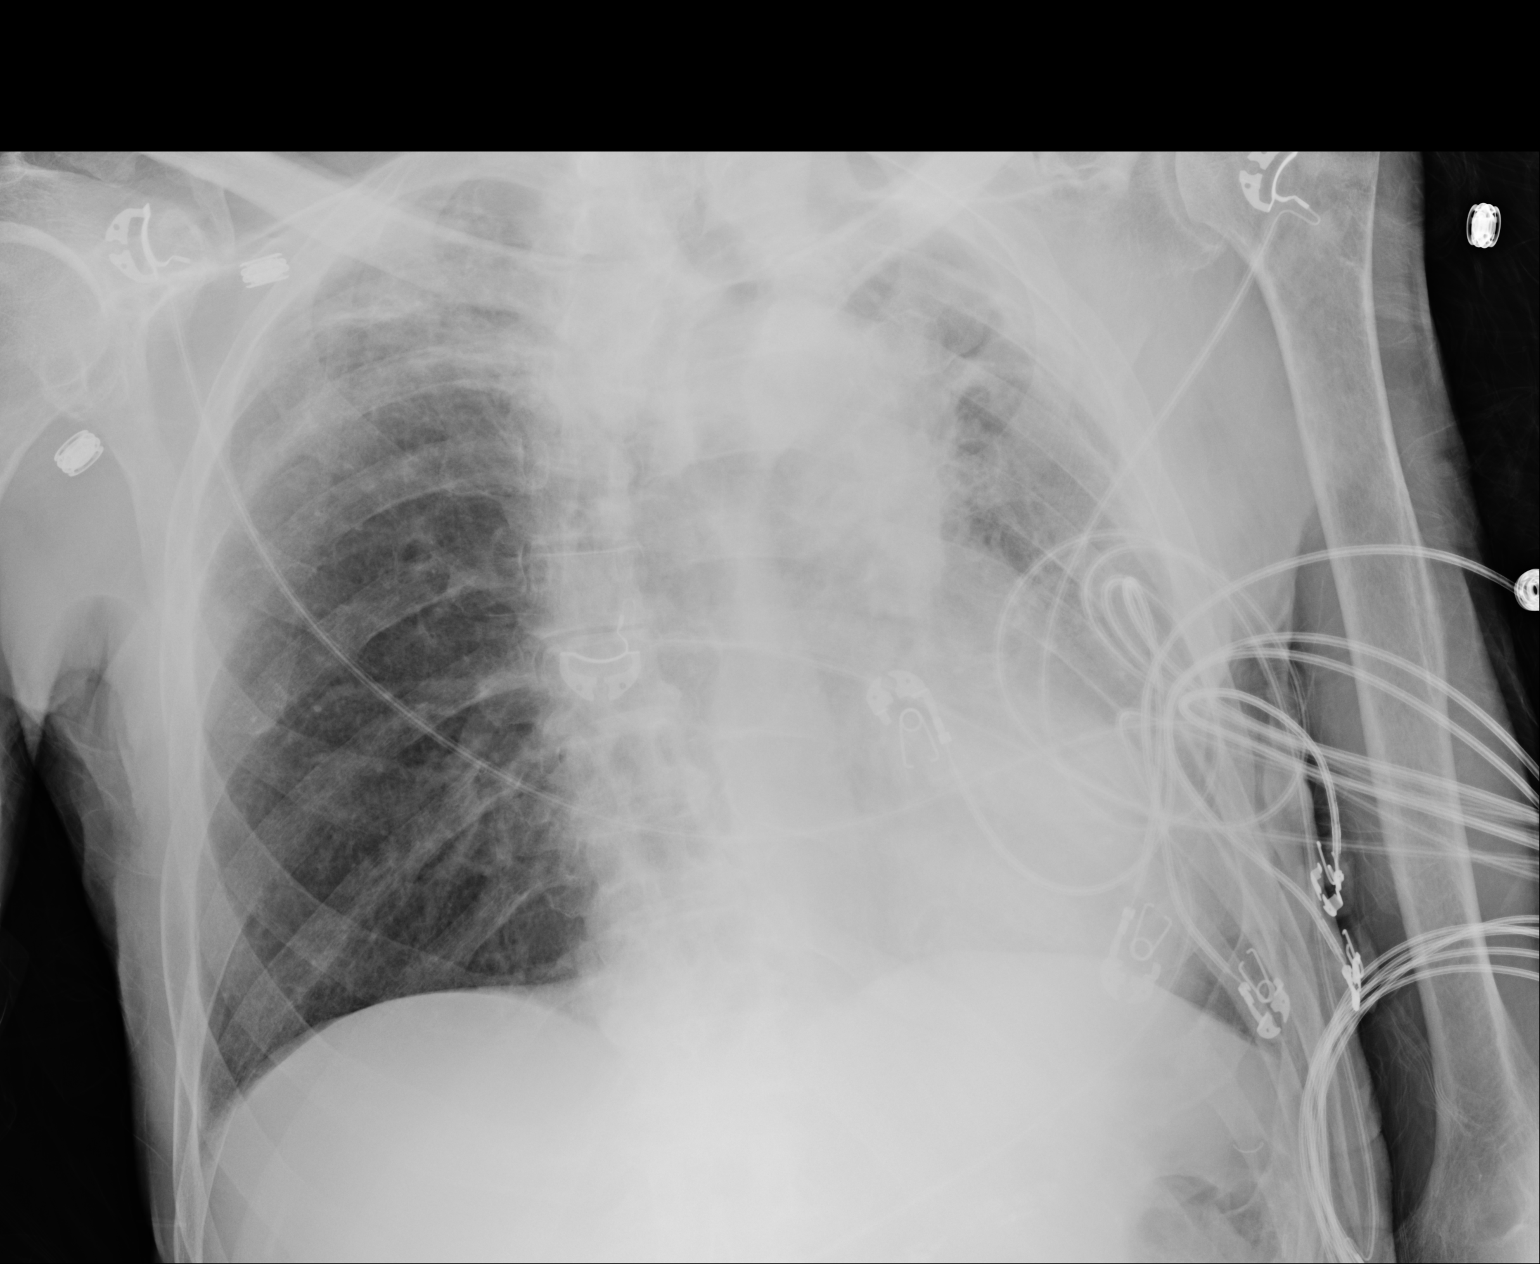
[im 2/2]
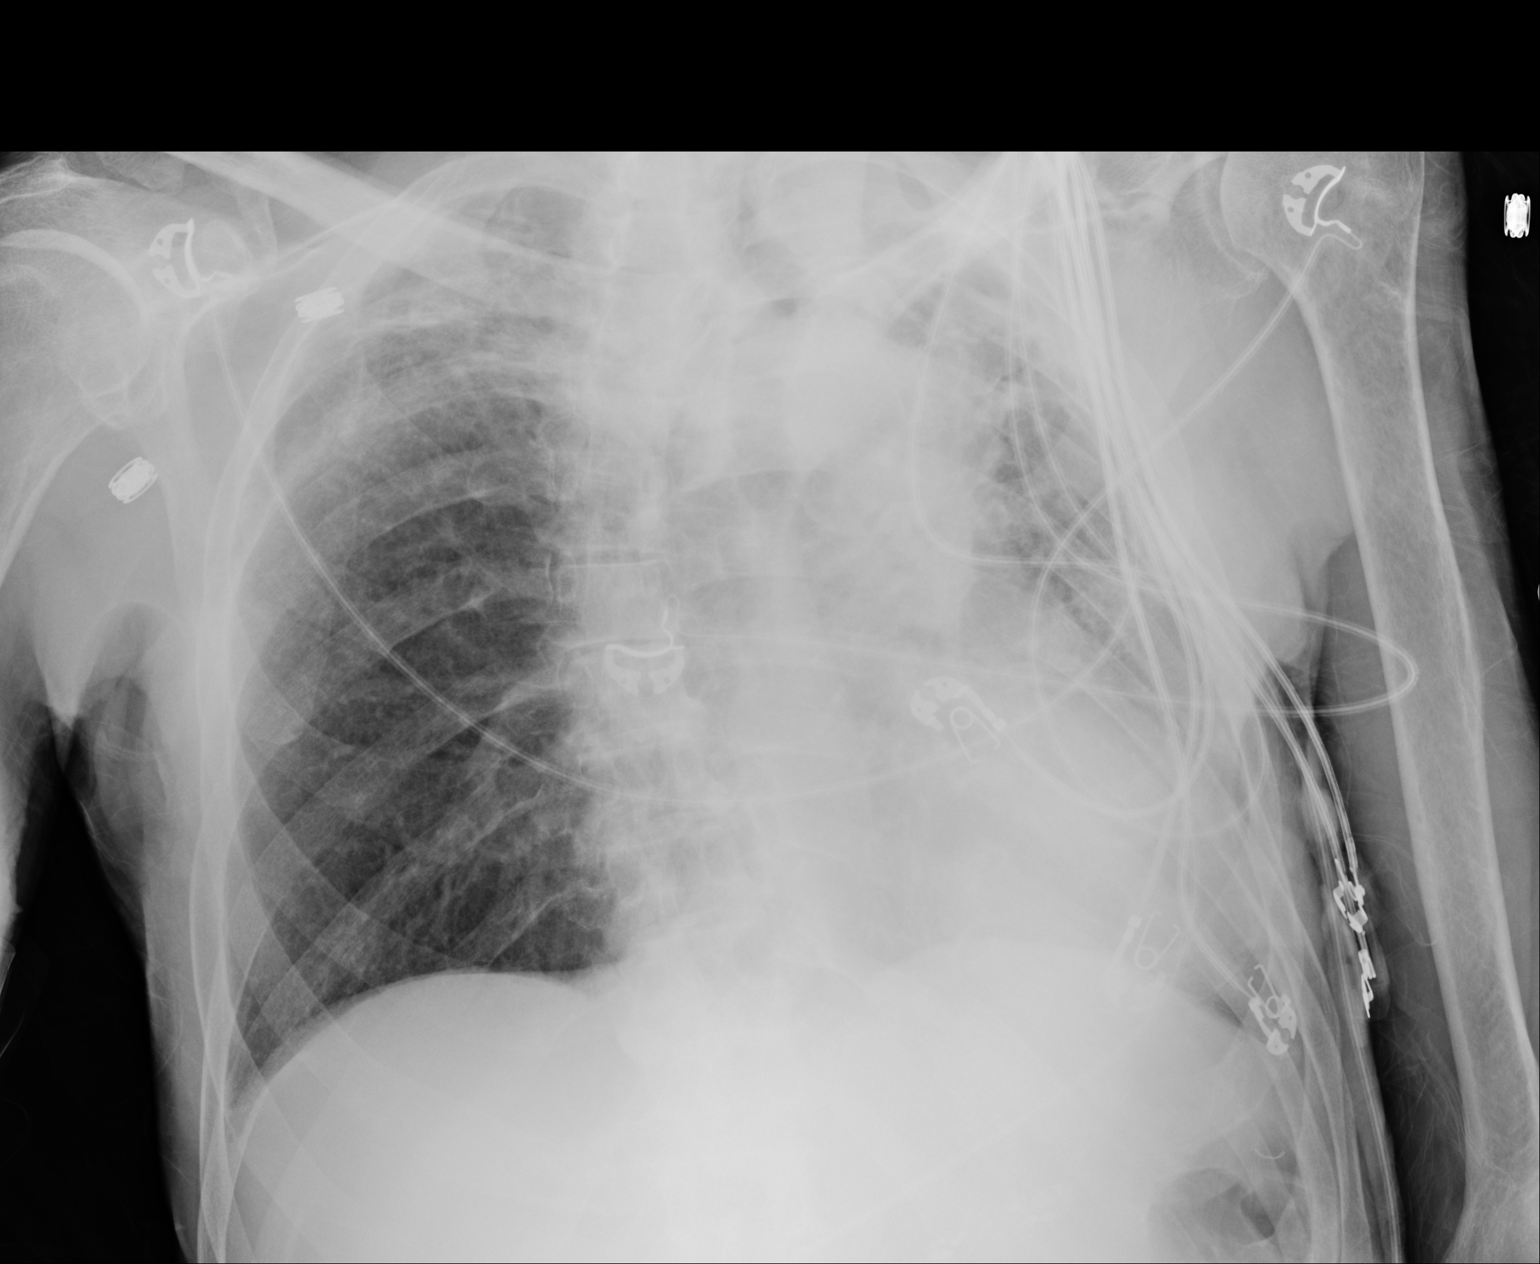

[2 of 2 positions shown; findings below may reference images not displayed]

FINDINGS: Development of multifocal opacities within the lung apices and left
base. No pleural effusion. Mild cardiomegaly. Stable slightly
enlarged cardiomediastinal silhouette with tortuosity of the aortic
arch. No pneumothorax. Possible left hilar density
IMPRESSION: New multifocal opacities within the apical lungs and left base which
may reflect multifocal pneumonia. Possible left hilar opacity, CT
chest suggested for further evaluation.

## 2020-07-31 ENCOUNTER — Other Ambulatory Visit: Payer: Self-pay | Admitting: Internal Medicine

## 2020-07-31 ENCOUNTER — Other Ambulatory Visit: Payer: Self-pay | Admitting: General Practice

## 2020-07-31 ENCOUNTER — Other Ambulatory Visit (HOSPITAL_COMMUNITY): Payer: Self-pay | Admitting: Internal Medicine

## 2020-08-05 ENCOUNTER — Other Ambulatory Visit (HOSPITAL_COMMUNITY): Payer: Self-pay | Admitting: Specialist

## 2020-08-05 DIAGNOSIS — R1319 Other dysphagia: Secondary | ICD-10-CM

## 2020-08-05 DIAGNOSIS — I6350 Cerebral infarction due to unspecified occlusion or stenosis of unspecified cerebral artery: Secondary | ICD-10-CM

## 2020-08-05 DIAGNOSIS — I639 Cerebral infarction, unspecified: Secondary | ICD-10-CM

## 2020-08-07 ENCOUNTER — Ambulatory Visit (HOSPITAL_COMMUNITY): Payer: Medicare Other | Attending: Internal Medicine | Admitting: Speech Pathology

## 2020-08-07 ENCOUNTER — Encounter (HOSPITAL_COMMUNITY): Payer: Self-pay | Admitting: Speech Pathology

## 2020-08-07 ENCOUNTER — Other Ambulatory Visit: Payer: Self-pay

## 2020-08-07 ENCOUNTER — Ambulatory Visit (HOSPITAL_COMMUNITY)
Admission: RE | Admit: 2020-08-07 | Discharge: 2020-08-07 | Disposition: A | Discharge status: Home / Self Care | Payer: Medicare Other | Source: Ambulatory Visit | Attending: Internal Medicine | Admitting: Internal Medicine

## 2020-08-07 DIAGNOSIS — R1312 Dysphagia, oropharyngeal phase: Secondary | ICD-10-CM | POA: Insufficient documentation

## 2020-08-07 DIAGNOSIS — I639 Cerebral infarction, unspecified: Secondary | ICD-10-CM

## 2020-08-07 DIAGNOSIS — I6350 Cerebral infarction due to unspecified occlusion or stenosis of unspecified cerebral artery: Secondary | ICD-10-CM | POA: Diagnosis present

## 2020-08-07 DIAGNOSIS — R1319 Other dysphagia: Secondary | ICD-10-CM | POA: Diagnosis present

## 2020-08-07 NOTE — Therapy (Signed)
[facility] [address] [place], [place], [address] Phone: [phone]   Fax:  [phone]  Modified Barium Swallow  Patient Details  Name: Jerry Lane MRN: [id number] Date of Birth: [birth year] No data recorded  Encounter Date: 08/07/2020   End of Session - 08/07/20 1639    Visit Number 1    Number of Visits 1           Past Medical History:  Past Medical History:  Diagnosis Date  . Anemia   . Arthritis   . Cognitive communication deficit   . Diabetes mellitus    Type II  . Dysarthria and anarthria   . Dysarthria and anarthria   . Dysphagia   . Hemiparesis (HCC)    left side  . Hemiplegia (HCC)    left side  . Hyperlipidemia   . Hypertension   . Renal disorder    chronic kidney disease  . Stroke [facility])    no residual weakness   Past Surgical History:  Past Surgical History:  Procedure Laterality Date  . TIBIA FRACTURE SURGERY  1987   Right, [facility]?   HPI: Jerry Lane is a 87 y/o male with hx significant for CVA and PNA. There are no notes in EPIC since 2018, however SLP reached out to SLP at [facility] and got the following information: Pt is a resident at [facility] where he was tolerating a mech soft/puree meat diet with thin liquids, Per treating SLP, Pt was diagnosed with PNA ~ one month ago. Pt demonstrates a very slow consumption rate at baseline and demonstrated congestion with PO following PNA diagnosis and was subsequently downgraded to D1/ NTL and has been tolerating this diet without difficulty. SLP requested MBSS to objectively assess the swallowing function and determine LRD.   No data recorded   Assessment / Plan / Recommendation  CHL IP CLINICAL IMPRESSIONS 08/07/2020  Clinical Impression Pt presents with mild oropharyngeal dysphagia; only 2-3 espisodes of flash penetration of thin liquids were visualized and no aspiration. With solid textures (regular) note significantly prolonged oral prep stage  requiring liquid wash to facilitate oral clearance of bolus. With thin liquids note lingual pumping prior to triggering a swallow, however, once triggered swallowing is swift and effective with minimal valleculae and pyriform residue. Note good laryngeal vesitbule closure and good hyolaryngeal excursion. Recommend mech soft diet with puree (d1) meats and thin liquids. Note because of Pt's extremely slow consumption rate that D1/puree diet may facilitate faster consumption rate and possibly increased intake, however, Pt can safely tolerate D3/mech soft with additional time and liquids provided throughout to facilitate clearance. Recommend strategies: one sip at a time, alternate bites and sips, slow rate. Defer to treating SLP to determine most appropriate diet in his daily environment. Recommend continued ST dysphagia therapy at SNF to ensure diet tolerance and ensure implementation of strategies.  SLP Visit Diagnosis Dysphagia, oropharyngeal phase (R13.12)  Attention and concentration deficit following --  Frontal lobe and executive function deficit following --  Impact on safety and function Mild aspiration risk      CHL IP TREATMENT RECOMMENDATION 12/29/2016  Treatment Recommendations Therapy as outlined in treatment plan below     Prognosis 12/29/2016  Prognosis for Safe Diet Advancement Good  Barriers to Reach Goals Cognitive deficits  Barriers/Prognosis Comment --    CHL IP DIET RECOMMENDATION 08/07/2020  SLP Diet Recommendations Dysphagia 1 (Puree) solids;Dysphagia 3 (Mech soft) solids;Thin liquid  Liquid Administration via Straw;Cup  Medication  Administration Whole meds with liquid  Compensations Minimize environmental distractions;Slow rate;Follow solids with liquid  Postural Changes --      CHL IP OTHER RECOMMENDATIONS 08/07/2020  Recommended Consults --  Oral Care Recommendations Oral care BID  Other Recommendations --      CHL IP FOLLOW UP RECOMMENDATIONS 12/29/2016  Follow up  Recommendations Skilled Nursing facility      [facility] IP FREQUENCY AND DURATION 12/29/2016  Speech Therapy Frequency (ACUTE ONLY) min 2x/week  Treatment Duration 2 weeks           CHL IP ORAL PHASE 08/07/2020  Oral Phase Impaired  Oral - Pudding Teaspoon --  Oral - Pudding Cup --  Oral - Honey Teaspoon --  Oral - Honey Cup --  Oral - Nectar Teaspoon --  Oral - Nectar Cup --  Oral - Nectar Straw --  Oral - Thin Teaspoon --  Oral - Thin Cup --  Oral - Thin Straw --  Oral - Puree --  Oral - Mech Soft --  Oral - Regular Reduced posterior propulsion;Lingual/palatal residue;Piecemeal swallowing  Oral - Multi-Consistency --  Oral - Pill --  Oral Phase - Comment --    CHL IP PHARYNGEAL PHASE 08/07/2020  Pharyngeal Phase Impaired  Pharyngeal- Pudding Teaspoon --  Pharyngeal --  Pharyngeal- Pudding Cup --  Pharyngeal --  Pharyngeal- Honey Teaspoon --  Pharyngeal --  Pharyngeal- Honey Cup --  Pharyngeal --  Pharyngeal- Nectar Teaspoon --  Pharyngeal --  Pharyngeal- Nectar Cup --  Pharyngeal --  Pharyngeal- Nectar Straw --  Pharyngeal --  Pharyngeal- Thin Teaspoon Pharyngeal residue - valleculae;Pharyngeal residue - pyriform  Pharyngeal --  Pharyngeal- Thin Cup Pharyngeal residue - valleculae;Pharyngeal residue - pyriform;Penetration/Apiration after swallow  Pharyngeal Material enters airway, remains ABOVE vocal cords then ejected out  Pharyngeal- Thin Straw Pharyngeal residue - valleculae;Pharyngeal residue - pyriform;Penetration/Aspiration during swallow;Penetration/Apiration after swallow  Pharyngeal Material enters airway, remains ABOVE vocal cords then ejected out  Pharyngeal- Puree Pharyngeal residue - valleculae;Pharyngeal residue - pyriform  Pharyngeal --  Pharyngeal- Mechanical Soft NT  Pharyngeal --  Pharyngeal- Regular Pharyngeal residue - valleculae;Pharyngeal residue - pyriform  Pharyngeal --  Pharyngeal- Multi-consistency NT  Pharyngeal --  Pharyngeal- Pill NT   Pharyngeal --  Pharyngeal Comment --     CHL IP CERVICAL ESOPHAGEAL PHASE 12/29/2016  Cervical Esophageal Phase WFL  Pudding Teaspoon --  Pudding Cup --  Honey Teaspoon --  Honey Cup --  Nectar Teaspoon --  Nectar Cup --  Nectar Straw --  Thin Teaspoon --  Thin Cup --  Thin Straw --  Puree --  Mechanical Soft --  Regular --  Multi-consistency --  Pill --  Cervical Esophageal Comment --   Jerry Lane H. Romie Levee, CCC-SLP Speech Language Pathologist   Jerry Lane 08/07/2020, 4:40 PM                        Jerry Lane 08/07/2020, 4:40 PM  [facility] [address] [place], [place], [address] Phone: [phone]   Fax:  [phone]  Name: Jerry Lane MRN: [id number] Date of Birth: [birth year]

## 2020-08-23 ENCOUNTER — Emergency Department (HOSPITAL_COMMUNITY): Payer: Medicare Other

## 2020-08-23 ENCOUNTER — Emergency Department (HOSPITAL_COMMUNITY)
Admission: EM | Admit: 2020-08-23 | Discharge: 2020-08-23 | Disposition: A | Discharge status: Home / Self Care | Payer: Medicare Other | Attending: Emergency Medicine | Admitting: Emergency Medicine

## 2020-08-23 ENCOUNTER — Encounter (HOSPITAL_COMMUNITY): Payer: Self-pay | Admitting: Emergency Medicine

## 2020-08-23 DIAGNOSIS — J189 Pneumonia, unspecified organism: Secondary | ICD-10-CM | POA: Diagnosis not present

## 2020-08-23 DIAGNOSIS — N183 Chronic kidney disease, stage 3 unspecified: Secondary | ICD-10-CM | POA: Diagnosis not present

## 2020-08-23 DIAGNOSIS — I129 Hypertensive chronic kidney disease with stage 1 through stage 4 chronic kidney disease, or unspecified chronic kidney disease: Secondary | ICD-10-CM | POA: Diagnosis not present

## 2020-08-23 DIAGNOSIS — E1122 Type 2 diabetes mellitus with diabetic chronic kidney disease: Secondary | ICD-10-CM | POA: Insufficient documentation

## 2020-08-23 DIAGNOSIS — Z79899 Other long term (current) drug therapy: Secondary | ICD-10-CM | POA: Diagnosis not present

## 2020-08-23 DIAGNOSIS — Z794 Long term (current) use of insulin: Secondary | ICD-10-CM | POA: Diagnosis not present

## 2020-08-23 DIAGNOSIS — Z20822 Contact with and (suspected) exposure to covid-19: Secondary | ICD-10-CM | POA: Insufficient documentation

## 2020-08-23 DIAGNOSIS — Z87891 Personal history of nicotine dependence: Secondary | ICD-10-CM | POA: Insufficient documentation

## 2020-08-23 DIAGNOSIS — R0902 Hypoxemia: Secondary | ICD-10-CM | POA: Diagnosis present

## 2020-08-23 NOTE — ED Triage Notes (Addendum)
Pt from [facility] to the ed via [facility] for a hypoxic event.   Pt has a hx of PNU. Patient has an oxygen saturation of 100 percent on RA.

## 2020-08-23 NOTE — Discharge Instructions (Addendum)
Lab work, Publishing copy and chest xray are stable today with normal oxygen levels during your emergency room stay today.  Complete the antibiotic prescribed by your primary MD.

## 2020-08-23 NOTE — ED Notes (Signed)
Placed pt on cardiac monitor

## 2020-08-23 NOTE — ED Notes (Signed)
EMS has been called for transport back to [place].

## 2020-08-23 NOTE — ED Provider Notes (Signed)
[facility] EMERGENCY DEPARTMENT Provider Note   CSN: [id number] Arrival date & time: 08/23/20  1323     History Chief Complaint  Patient presents with  . Shortness of Breath    Jerry Lane is a 87 y.o. male with a history of type 2 diabetes, history of CVA with dysarthria and mild aspiration risk, hypertension, cognitive disorder who is currently being treated for pneumonia presenting for an episode of suspected hypoxia occurring just prior to arrival.  He did not have an oxygen level checked, the nursing home staff guesstimated his oxygen levels around 50% when they noticed he had perioral and fingertip purple coloration.  He is on day 9 of a 10-day course of doxycycline, and according to the [facility], IV ceftazidime has been started today additionally.  Patient is unable to provide any meaningful history to today's visit.  Level 5 caveat given patient's chronic mental status.  The history is provided by the nursing home and the EMS personnel.       Past Medical History:  Diagnosis Date  . Anemia   . Arthritis   . Cognitive communication deficit   . Diabetes mellitus    Type II  . Dysarthria and anarthria   . Dysarthria and anarthria   . Dysphagia   . Hemiparesis (HCC)    left side  . Hemiplegia (HCC)    left side  . Hyperlipidemia   . Hypertension   . Renal disorder    chronic kidney disease  . Stroke [facility])    no residual weakness    Patient Active Problem List   Diagnosis Date Noted  . Left-sided weakness 06/17/2017  . History of stroke 02/17/2017  . Protein-calorie malnutrition, severe 12/29/2016  . Pressure injury of skin 12/27/2016  . Carotid stenosis, symptomatic, with infarction (HCC)   . Hyperlipidemia   . Cerebrovascular accident (CVA) due to stenosis of right carotid artery (HCC) 12/25/2016  . Anemia 01/09/2016  . CKD (chronic kidney disease) stage 3, GFR 30-59 ml/min (HCC) 01/09/2016  . Hyperkalemia 10/20/2010  . Renal failure 10/20/2010  . Type 2  diabetes mellitus with stage 3 chronic kidney disease, without long-term current use of insulin (HCC) 10/20/2010  . Essential hypertension 10/20/2010  . Cerebrovascular disease 10/20/2010    Past Surgical History:  Procedure Laterality Date  . TIBIA FRACTURE SURGERY  1987   Right, [facility]?       Family History  Problem Relation Age of Onset  . Anesthesia problems Neg Hx   . Hypotension Neg Hx   . Malignant hyperthermia Neg Hx   . Pseudochol deficiency Neg Hx     Social History   Tobacco Use  . Smoking status: Former Smoker    Years: 60.00    Types: Cigarettes    Quit date: 10/13/2005    Years since quitting: 14.8  . Smokeless tobacco: Never Used  Vaping Use  . Vaping Use: Never used  Substance Use Topics  . Alcohol use: No  . Drug use: No    Home Medications Prior to Admission medications   Medication Sig Start Date End Date Taking? Authorizing Provider  acetaminophen (TYLENOL) 500 MG tablet Take 500 mg by mouth every 6 (six) hours as needed for mild pain or moderate pain.    [provider]  amLODipine (NORVASC) 2.5 MG tablet Take 2.5 mg by mouth daily.      [provider]  atorvastatin (LIPITOR) 40 MG tablet Take 1 tablet (40 mg total) by mouth daily at  6 PM. 12/30/16   Marguerita Merles Latif, DO  bisacodyl (DULCOLAX) 5 MG EC tablet Take 5 mg by mouth daily.    [provider]  cephALEXin (KEFLEX) 500 MG capsule Take 1 capsule (500 mg total) by mouth 3 (three) times daily. Patient taking differently: Take 500 mg by mouth 3 (three) times daily. 7 day course starting on 03/14/2018 03/13/18   Devoria Albe, MD  cinacalcet (SENSIPAR) 30 MG tablet Take 30 mg by mouth daily.    [provider]  clopidogrel (PLAVIX) 75 MG tablet Take 1 tablet (75 mg total) by mouth daily. 12/30/16   Marguerita Merles Latif, DO  insulin glargine (LANTUS) 100 UNIT/ML injection Inject 30 Units into the skin every morning.    [provider]  insulin lispro (HUMALOG)  100 UNIT/ML injection Inject 2-10 Units into the skin 3 (three) times daily before meals. Sliding scale; 201-250= 2 units 251-300= 4 units 301-350= 6 units 351-400= 8 units 401-450=10units    [provider]  Multiple Vitamin (MULTIVITAMIN WITH MINERALS) TABS tablet Take 1 tablet by mouth daily. 12/30/16   Sheikh, Omair Latif, DO  TRADJENTA 5 MG TABS tablet Take 5 mg by mouth daily.  06/02/16   [provider]  Vitamin D, Ergocalciferol, (DRISDOL) 1.25 MG (50000 UT) CAPS capsule Take 50,000 Units by mouth every 7 (seven) days.    [provider]    Allergies    Patient has no known allergies.  Review of Systems   Review of Systems  Unable to perform ROS: Dementia    Physical Exam Updated Vital Signs BP (!) 117/106   Pulse 95   Temp 98.9 F (37.2 C) (Rectal)   Resp (!) 22   SpO2 100%   Physical Exam Constitutional:      General: He is not in acute distress.    Appearance: He is well-developed.     Comments: Awake, confused, cachexia   HENT:     Head: Normocephalic and atraumatic.     Jaw: No trismus.     Right Ear: Tympanic membrane and external ear normal.     Left Ear: Tympanic membrane and external ear normal.     Mouth/Throat:     Mouth: No oral lesions.     Dentition: Dental abscesses present.  Eyes:     Conjunctiva/sclera: Conjunctivae normal.  Cardiovascular:     Rate and Rhythm: Normal rate.     Heart sounds: Normal heart sounds.  Pulmonary:     Effort: Pulmonary effort is normal.     Breath sounds: Examination of the left-lower field reveals decreased breath sounds. Decreased breath sounds present. No wheezing, rhonchi or rales.     Comments: Patient is saturating 100% on room air. Abdominal:     General: There is no distension.  Musculoskeletal:        General: Normal range of motion.     Cervical back: Normal range of motion and neck supple.  Lymphadenopathy:     Cervical: No cervical adenopathy.  Skin:    General: Skin is  warm and dry.     Findings: No erythema.  Neurological:     Mental Status: He is alert.     Comments: Apparent left-sided weakness with contracture noted of left upper extremity.  Suspect mental status is baseline per records that accompanies patient.  Is a total care bedbound patient.     ED Results / Procedures / Treatments   Labs (all labs ordered are listed, but only abnormal results  are displayed) Labs Reviewed  SARS CORONAVIRUS 2 (TAT 6-24 HRS)    EKG EKG Interpretation  Date/Time:  Friday Aug 23 2020 17:01:20 EDT Ventricular Rate:  88 PR Interval:  179 QRS Duration: 128 QT Interval:  428 QTC Calculation: 518 R Axis:   -59 Text Interpretation: Sinus or ectopic atrial rhythm Consider left atrial enlargement IVCD, consider atypical RBBB Anterior infarct, age indeterminate Lateral leads are also involved Confirmed by Bethann Berkshire [phone]) on 08/23/2020 5:17:07 PM   Radiology DG Chest Port 1 View  Result Date: 08/23/2020 CLINICAL DATA:  87 year old male with history of shortness of breath. EXAM: PORTABLE CHEST 1 VIEW COMPARISON:  Chest x-ray 03/16/2018. FINDINGS: Study is limited by patient rotation to the left. With these limitations in mind, lung volumes are low. Chronic areas of interstitial prominence are noted in the lungs bilaterally, most evident in the upper lobes of the lungs, particularly in the apices. Lower lungs appear clear. No pneumothorax. No evidence of pulmonary edema. Heart size is normal. Upper mediastinal contours are grossly distorted by patient positioning, but appear similar to prior studies. IMPRESSION: 1. Limited study demonstrating what appears to be chronic post infectious or inflammatory scarring in the upper lungs. No definite radiographic evidence of acute cardiopulmonary disease. Electronically Signed   By: Trudie Reed M.D.   On: 08/23/2020 15:17    Procedures Procedures   Medications Ordered in ED Medications - No data to display  ED  Course  I have reviewed the triage vital signs and the nursing notes.  Pertinent labs & imaging results that were available during my care of the patient were reviewed by me and considered in my medical decision making (see chart for details).    MDM Rules/Calculators/A&P                          Exam and history is consistent with resolving healthcare acquired pneumonia.  He is currently on appropriate antibiotic therapy.  He has been observed in the department and has had no hypoxia here, oxygen saturations remaining above 96% on room air.  No respiratory distress.  Labs and imaging reviewed.  He was also screened for COVID, this test is currently pending, doubt this will be positive.  Per the [facility] he participates in weekly COVID screening per his nursing home protocol and has for the duration of the COVID pandemic.  NAGEE GOATES was evaluated in Emergency Department on 08/23/2020 for the symptoms described in the history of present illness. He was evaluated in the context of the global COVID-19 pandemic, which necessitated consideration that the patient might be at risk for infection with the SARS-CoV-2 virus that causes COVID-19. Institutional protocols and algorithms that pertain to the evaluation of patients at risk for COVID-19 are in a state of rapid change based on information released by regulatory bodies including the CDC and federal and state organizations. These policies and algorithms were followed during the patient's care in the ED.  Final Clinical Impression(s) / ED Diagnoses Final diagnoses:  HCAP (healthcare-associated pneumonia)    Rx / DC Orders ED Discharge Orders    None       Victoriano Lain 08/23/20 1728    Vanetta Mulders, MD 08/23/20 [phone]

## 2020-08-23 NOTE — ED Notes (Signed)
Pt unable to sign discharge paperwork due to dementia  

## 2020-08-23 NOTE — ED Notes (Signed)
Pt put on male purewick

## 2020-08-23 NOTE — ED Notes (Signed)
Attempted to call report to [facility]. No answer. Will attempt again later.

## 2020-08-24 LAB — SARS CORONAVIRUS 2 (TAT 6-24 HRS): SARS Coronavirus 2: NEGATIVE

## 2020-09-04 DEATH — deceased
